# Patient Record
Sex: Female | Born: 1990 | Race: White | Hispanic: No | Marital: Single | Smoking: Never smoker
Health system: Southern US, Community
[De-identification: ages and names within clinical notes are randomized; demographics above are authoritative.]

## PROBLEM LIST (undated history)

## (undated) DIAGNOSIS — Z973 Presence of spectacles and contact lenses: Secondary | ICD-10-CM

## (undated) DIAGNOSIS — F419 Anxiety disorder, unspecified: Secondary | ICD-10-CM

## (undated) DIAGNOSIS — F32A Depression, unspecified: Secondary | ICD-10-CM

## (undated) DIAGNOSIS — K219 Gastro-esophageal reflux disease without esophagitis: Secondary | ICD-10-CM

## (undated) DIAGNOSIS — G43909 Migraine, unspecified, not intractable, without status migrainosus: Secondary | ICD-10-CM

## (undated) HISTORY — PX: TONSILLECTOMY AND ADENOIDECTOMY: SUR1326

## (undated) HISTORY — PX: TONSILLECTOMY: SUR1361

---
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
## 2009-04-23 DIAGNOSIS — J309 Allergic rhinitis, unspecified: Secondary | ICD-10-CM | POA: Insufficient documentation

## 2012-07-29 ENCOUNTER — Emergency Department
Admission: EM | Admit: 2012-07-29 | Discharge: 2012-07-29 | Disposition: A | Discharge status: Home / Self Care | Payer: BC Managed Care – PPO | Source: Home / Self Care | Attending: Family Medicine | Admitting: Family Medicine

## 2012-07-29 ENCOUNTER — Encounter: Payer: Self-pay | Admitting: *Deleted

## 2012-07-29 DIAGNOSIS — M940 Chondrocostal junction syndrome [Tietze]: Secondary | ICD-10-CM

## 2012-07-29 DIAGNOSIS — J111 Influenza due to unidentified influenza virus with other respiratory manifestations: Secondary | ICD-10-CM

## 2012-07-29 LAB — POCT INFLUENZA A/B: Influenza B, POC: NEGATIVE

## 2012-07-29 MED ORDER — OSELTAMIVIR PHOSPHATE 75 MG PO CAPS: 75.0000 mg | ORAL_CAPSULE | Freq: Two times a day (BID) | ORAL | Status: DC

## 2012-07-29 MED ORDER — BENZONATATE 200 MG PO CAPS: 200.0000 mg | ORAL_CAPSULE | Freq: Every day | ORAL | Status: DC

## 2012-07-29 NOTE — ED Provider Notes (Addendum)
History     CSN: [id number]  Arrival date & time 07/29/12  1631   First MD Initiated Contact with Patient 07/29/12 1648      Chief Complaint  Patient presents with  . Influenza      HPI Comments: This morning patient developed sudden onset flu symptoms with cough, sore throat, fever, chest tightness, fatigue, myalgias, and headache.  The history is provided by the patient.    History reviewed. No pertinent past medical history.  Past Surgical History  Procedure Date  . Tonsillectomy     Family History  Problem Relation Age of Onset  . Cancer Maternal Aunt     History  Substance Use Topics  . Smoking status: Never Smoker   . Smokeless tobacco: Never Used  . Alcohol Use: No    OB History    Grav Para Term Preterm Abortions TAB SAB Ect Mult Living                  Review of Systems + sore throat + cough No pleuritic pain + wheezing + nasal congestion + post-nasal drainage No sinus pain/pressure No itchy/red eyes ? earache No hemoptysis + SOB + fever, + chills + nausea No vomiting No abdominal pain + diarrhea No urinary symptoms No skin rashes + fatigue + myalgias + headache   Allergies  Penicillins  Home Medications   Current Outpatient Rx  Name  Route  Sig  Dispense  Refill  . CETIRIZINE HCL 1 MG/ML PO SYRP   Oral   Take by mouth daily.         . ETONOGESTREL-ETHINYL ESTRADIOL 0.12-0.015 MG/24HR VA RING   Vaginal   Place 1 each vaginally every 28 (twenty-eight) days. Insert vaginally and leave in place for 3 consecutive weeks, then remove for 1 week.         Marland Kitchen MAGNESIUM 30 MG PO TABS   Oral   Take 30 mg by mouth 2 (two) times daily.         Marland Kitchen B-2 PO   Oral   Take by mouth.         . TOPIRAMATE ER 100 MG PO CP24   Oral   Take by mouth.         . BENZONATATE 200 MG PO CAPS   Oral   Take 1 capsule (200 mg total) by mouth at bedtime. Take as needed for cough   12 capsule   0   . OSELTAMIVIR PHOSPHATE 75 MG PO  CAPS   Oral   Take 1 capsule (75 mg total) by mouth every 12 (twelve) hours.   10 capsule   0     BP 112/72  Pulse 113  Temp 100.6 F (38.1 C) (Oral)  Resp 22  Ht 5\' 1"  (1.549 m)  Wt 233 lb 4 oz (105.802 kg)  BMI 44.07 kg/m2  SpO2 97%  Physical Exam Nursing notes and Vital Signs reviewed. Appearance:  Patient appears stated age, and in no acute distress.  Patient is obese (BMI 44.1) Eyes:  Pupils are equal, round, and reactive to light and accomodation.  Extraocular movement is intact.  Conjunctivae are not inflamed  Ears:  Canals normal.  Tympanic membranes normal.  Nose:  Mildly congested turbinates.  No sinus tenderness.    Pharynx:  Normal Neck:  Supple.  Slightly tender shotty posterior nodes are palpated bilaterally  Lungs:  Clear to auscultation.  Breath sounds are equal.  Chest:  Distinct tenderness to palpation over  the mid-sternum.  Heart:  Regular rate and rhythm without murmurs, rubs, or gallops.  Abdomen:  Nontender without masses or hepatosplenomegaly.  Bowel sounds are present.  No CVA or flank tenderness.  Extremities:  No edema.  No calf tenderness Skin:  No rash present.   ED Course  Procedures none   Labs Reviewed  POCT INFLUENZA A/B      1. Influenza-like illness   2. Costochondritis, acute       MDM  Begin Tamiflu.  Prescription written for Benzonatate [facility]) to take at bedtime for night-time cough.  Take plain Mucinex (guaifenesin) twice daily for cough and congestion.  Increase fluid intake, rest.  Also recommend using saline nasal spray several times daily and saline nasal irrigation (AYR is a common brand) Recommend flu shot when well. Stop all antihistamines for now, and other non-prescription cough/cold preparations. Follow-up with family doctor if not improving 7 to 10 days.         Lattie Haw, MD 07/29/12 2008  Lattie Haw, MD 07/29/12 2008

## 2012-07-29 NOTE — ED Notes (Signed)
Patient c/o flu symptoms since this AM.

## 2012-11-16 ENCOUNTER — Encounter: Payer: Self-pay | Admitting: Sports Medicine

## 2012-11-16 ENCOUNTER — Ambulatory Visit (INDEPENDENT_AMBULATORY_CARE_PROVIDER_SITE_OTHER): Payer: BC Managed Care – PPO | Admitting: Sports Medicine

## 2012-11-16 VITALS — BP 133/84 | HR 108 | Wt 197.0 lb

## 2012-11-16 DIAGNOSIS — J309 Allergic rhinitis, unspecified: Secondary | ICD-10-CM

## 2012-11-16 DIAGNOSIS — E669 Obesity, unspecified: Secondary | ICD-10-CM

## 2012-11-16 DIAGNOSIS — Z299 Encounter for prophylactic measures, unspecified: Secondary | ICD-10-CM | POA: Insufficient documentation

## 2012-11-16 DIAGNOSIS — G43909 Migraine, unspecified, not intractable, without status migrainosus: Secondary | ICD-10-CM

## 2012-11-16 MED ORDER — FLUTICASONE PROPIONATE 50 MCG/ACT NA SUSP: NASAL | Status: DC

## 2012-11-16 MED ORDER — ATENOLOL 25 MG PO TABS: 25.0000 mg | ORAL_TABLET | Freq: Every day | ORAL | Status: DC

## 2012-11-16 NOTE — Assessment & Plan Note (Signed)
Continue phentermine and lipophilic shots. This is managed by an outside physician.

## 2012-11-16 NOTE — Assessment & Plan Note (Signed)
Last Pap smear was March of 2013. She follows up with [facility].

## 2012-11-16 NOTE — Progress Notes (Signed)
  Subjective:    CC: Establish care.   HPI:  Migraines: Usually occur 1-2 times per month but occasionally more frequently. Using topiramate.  Obesity: Is getting lipotrophic injections by an outside provider, also taking phentermine all of which is filled by the weight loss provider.  Nasal drainage: Present annually in the spring. Rhinorrhea, postnasal drip without sore throat. Already taking Zyrtec without much effect.  Preventative measures: Goes to [facility], last Pap smear was March of 2013 and was normal.  Past medical history, Surgical history, Family history not pertinant except as noted below, Social history, Allergies, and medications have been entered into the medical record, reviewed, and no changes needed.   Review of Systems: No headache, visual changes, nausea, vomiting, diarrhea, constipation, dizziness, abdominal pain, skin rash, fevers, chills, night sweats, swollen lymph nodes, weight loss, chest pain, body aches, joint swelling, muscle aches, shortness of breath, mood changes, visual or auditory hallucinations.  Objective:    General: Well Developed, well nourished, and in no acute distress.  Neuro: Alert and oriented x3, extra-ocular muscles intact, sensation grossly intact.  HEENT: Normocephalic, atraumatic, pupils equal round reactive to light, neck supple, no masses, no lymphadenopathy, thyroid nonpalpable. Nasal turbinates are boggy, oropharynx is unremarkable. Skin: Warm and dry, no rashes noted.  Cardiac: Regular rate and rhythm, no murmurs rubs or gallops.  Respiratory: Clear to auscultation bilaterally. Not using accessory muscles, speaking in full sentences.  Abdominal: Soft, nontender, nondistended, positive bowel sounds, no masses, no organomegaly.  Musculoskeletal: Shoulder, elbow, wrist, hip, knee, ankle stable, and with full range of motion. Impression and Recommendations:    The patient was counselled, risk factors were discussed, anticipatory  guidance given.

## 2012-11-16 NOTE — Assessment & Plan Note (Signed)
Continue topiramate. Adding atenolol, this will help prevent migraines as well as bring down her heart rate.

## 2012-11-16 NOTE — Assessment & Plan Note (Signed)
Continue Zyrtec, adding Flonase.

## 2012-11-24 ENCOUNTER — Encounter: Payer: Self-pay | Admitting: Sports Medicine

## 2012-11-24 NOTE — Progress Notes (Signed)
Records received and reviewed.

## 2012-12-14 ENCOUNTER — Encounter: Payer: BC Managed Care – PPO | Admitting: Sports Medicine

## 2012-12-27 ENCOUNTER — Encounter: Payer: Self-pay | Admitting: Sports Medicine

## 2012-12-27 ENCOUNTER — Ambulatory Visit (INDEPENDENT_AMBULATORY_CARE_PROVIDER_SITE_OTHER): Payer: BC Managed Care – PPO | Admitting: Sports Medicine

## 2012-12-27 VITALS — BP 104/64 | HR 74 | Wt 192.0 lb

## 2012-12-27 DIAGNOSIS — Z23 Encounter for immunization: Secondary | ICD-10-CM

## 2012-12-27 DIAGNOSIS — G43909 Migraine, unspecified, not intractable, without status migrainosus: Secondary | ICD-10-CM

## 2012-12-27 DIAGNOSIS — Z0000 Encounter for general adult medical examination without abnormal findings: Secondary | ICD-10-CM

## 2012-12-27 DIAGNOSIS — Z299 Encounter for prophylactic measures, unspecified: Secondary | ICD-10-CM

## 2012-12-27 NOTE — Assessment & Plan Note (Signed)
She went from 8 migraine headaches per month to 2 migraine headaches per month with the addition of atenolol.

## 2012-12-27 NOTE — Progress Notes (Signed)
  Subjective:    CC: Complete physical  HPI:  Preventive measure: Kendra Nolan is here for complete physical, she gets her Pap smears by her OB/GYN, and is otherwise healthy. She has not had a Tdap in over 10 years.  Migraines: Went down from 8 per month to 2 per month after starting atenolol. She is very happy with the results so far, and does not want to change the dose.  Past medical history, Surgical history, Family history not pertinant except as noted below, Social history, Allergies, and medications have been entered into the medical record, reviewed, and no changes needed.   Review of Systems: No headache, visual changes, nausea, vomiting, diarrhea, constipation, dizziness, abdominal pain, skin rash, fevers, chills, night sweats, swollen lymph nodes, weight loss, chest pain, body aches, joint swelling, muscle aches, shortness of breath, mood changes, visual or auditory hallucinations.  Objective:    General: Well Developed, well nourished, and in no acute distress.  Neuro: Alert and oriented x3, extra-ocular muscles intact, sensation grossly intact.  HEENT: Normocephalic, atraumatic, pupils equal round reactive to light, neck supple, no masses, no lymphadenopathy, thyroid nonpalpable.  Skin: Warm and dry, no rashes noted.  Cardiac: Regular rate and rhythm, no murmurs rubs or gallops.  Respiratory: Clear to auscultation bilaterally. Not using accessory muscles, speaking in full sentences.  Abdominal: Soft, nontender, nondistended, positive bowel sounds, no masses, no organomegaly.  Musculoskeletal: Shoulder, elbow, wrist, hip, knee, ankle stable, and with full range of motion. Impression and Recommendations:    The patient was counselled, risk factors were discussed, anticipatory guidance given.

## 2012-12-27 NOTE — Assessment & Plan Note (Addendum)
Physical performed today. PAP with her OBGYN. Tdap today. Return in one year.

## 2013-01-02 ENCOUNTER — Encounter: Payer: Self-pay | Admitting: Emergency Medicine

## 2013-01-02 ENCOUNTER — Emergency Department
Admission: EM | Admit: 2013-01-02 | Discharge: 2013-01-02 | Disposition: A | Discharge status: Home / Self Care | Payer: BC Managed Care – PPO | Source: Home / Self Care | Attending: Emergency Medicine | Admitting: Emergency Medicine

## 2013-01-02 DIAGNOSIS — B373 Candidiasis of vulva and vagina: Secondary | ICD-10-CM

## 2013-01-02 DIAGNOSIS — J069 Acute upper respiratory infection, unspecified: Secondary | ICD-10-CM

## 2013-01-02 MED ORDER — FLUCONAZOLE 150 MG PO TABS: 150.0000 mg | ORAL_TABLET | Freq: Once | ORAL | Status: DC

## 2013-01-02 MED ORDER — AZITHROMYCIN 250 MG PO TABS: ORAL_TABLET | ORAL | Status: DC

## 2013-01-02 NOTE — ED Provider Notes (Signed)
History     CSN: [id number]  Arrival date & time 01/02/13  1723   First MD Initiated Contact with Patient 01/02/13 1731      No chief complaint on file.   (Consider location/radiation/quality/duration/timing/severity/associated sxs/prior treatment) HPI Kendra Nolan is a 22 y.o. female who complains of onset of cold symptoms for 4-5 days.  The symptoms are constant and mild-moderate in severity.  Taking Flonase and Zyrtec which help a little bit.  Also complains of yeast infection.  Started mid-period a few days ago.  Has a history of similar.  White thick discharge, very itchy.  No dysuria, hematuria. No sore throat No cough No pleuritic pain No wheezing + nasal congestion + post-nasal drainage ++ sinus pain/pressure No chest congestion No itchy/red eyes No earache No hemoptysis No SOB No chills/sweats No fever No nausea No vomiting No abdominal pain No diarrhea No skin rashes No fatigue No myalgias + headache    No past medical history on file.  Past Surgical History  Procedure Laterality Date  . Tonsillectomy    . Tonsillectomy and adenoidectomy      Family History  Problem Relation Age of Onset  . Cancer Maternal Aunt   . Hypertension Mother   . Hyperlipidemia Father   . Hyperlipidemia Maternal Grandmother   . Hyperlipidemia Maternal Grandfather   . Hypertension Maternal Grandfather   . Hyperlipidemia Paternal Grandmother   . Hyperlipidemia Paternal Grandfather     History  Substance Use Topics  . Smoking status: Never Smoker   . Smokeless tobacco: Never Used  . Alcohol Use: No    OB History   Grav Para Term Preterm Abortions TAB SAB Ect Mult Living                  Review of Systems  All other systems reviewed and are negative.    Allergies  Aspartame and phenylalanine; Benzodiazepines; Clarithromycin; and Penicillins  Home Medications   Current Outpatient Rx  Name  Route  Sig  Dispense  Refill  . atenolol (TENORMIN) 25 MG tablet  Oral   Take 1 tablet (25 mg total) by mouth daily.   90 tablet   3   . azithromycin (ZITHROMAX Z-PAK) 250 MG tablet      Use as directed   1 each   0   . azithromycin (ZITHROMAX Z-PAK) 250 MG tablet      Use as directed   1 each   0   . cetirizine (ZYRTEC) 1 MG/ML syrup   Oral   Take by mouth daily.         Marland Kitchen etonogestrel-ethinyl estradiol (NUVARING) 0.12-0.015 MG/24HR vaginal ring   Vaginal   Place 1 each vaginally every 28 (twenty-eight) days. Insert vaginally and leave in place for 3 consecutive weeks, then remove for 1 week.         . fluconazole (DIFLUCAN) 150 MG tablet   Oral   Take 1 tablet (150 mg total) by mouth once. May repeat in 3 days   2 tablet   0   . fluticasone (FLONASE) 50 MCG/ACT nasal spray      One spray in each nostril twice a day, use left hand for right nostril, and right hand for left nostril.   48 g   3   . phentermine 37.5 MG capsule   Oral   Take 37.5 mg by mouth every morning.         . Topiramate ER (TROKENDI XR) 100 MG CP24  Oral   Take by mouth.           There were no vitals taken for this visit.  Physical Exam  Nursing note and vitals reviewed. Constitutional: She is oriented to person, place, and time. She appears well-developed and well-nourished.  HENT:  Head: Normocephalic and atraumatic.  Right Ear: Tympanic membrane, external ear and ear canal normal.  Left Ear: Tympanic membrane, external ear and ear canal normal.  Nose: Mucosal edema and rhinorrhea present.  Mouth/Throat: Posterior oropharyngeal erythema (mild with clear post nasal drip) present. No oropharyngeal exudate or posterior oropharyngeal edema.  Eyes: No scleral icterus.  Neck: Neck supple.  Cardiovascular: Regular rhythm and normal heart sounds.   Pulmonary/Chest: Effort normal and breath sounds normal. No respiratory distress.  Genitourinary:  deferred  Neurological: She is alert and oriented to person, place, and time.  Skin: Skin is warm  and dry.  Psychiatric: She has a normal mood and affect. Her speech is normal.    ED Course  Procedures (including critical care time)  Labs Reviewed - No data to display No results found.   1. Acute upper respiratory infections of unspecified site   2. Yeast vaginitis       MDM  1)  Take the prescribed antibiotic as instructed.  Viral vs allergic likely.  She is allergic to clarithromycin but can take Zpak no problem.  Will also give DIflucan.  If continued problems, needs to follow up with PCP for pelvic exam. 2)  Use nasal saline solution (over the counter) at least 3 times a day. 3)  Use over the counter decongestants like Zyrtec-D every 12 hours as needed to help with congestion.  If you have hypertension, do not take medicines with sudafed.  4)  Can take tylenol every 6 hours or motrin every 8 hours for pain or fever. 5)  Follow up with your primary doctor if no improvement in 5-7 days, sooner if increasing pain, fever, or new symptoms.     Marlaine Hind, MD 01/02/13 [phone]

## 2013-01-02 NOTE — ED Notes (Signed)
Runny nose for 4 days, congestion Itching and redness in vaginal area x 6 days

## 2013-01-08 ENCOUNTER — Emergency Department
Admission: EM | Admit: 2013-01-08 | Discharge: 2013-01-08 | Disposition: A | Discharge status: Home / Self Care | Payer: BC Managed Care – PPO | Source: Home / Self Care | Attending: Family Medicine | Admitting: Family Medicine

## 2013-01-08 ENCOUNTER — Emergency Department (INDEPENDENT_AMBULATORY_CARE_PROVIDER_SITE_OTHER): Payer: BC Managed Care – PPO

## 2013-01-08 DIAGNOSIS — J069 Acute upper respiratory infection, unspecified: Secondary | ICD-10-CM

## 2013-01-08 DIAGNOSIS — R05 Cough: Secondary | ICD-10-CM

## 2013-01-08 DIAGNOSIS — R053 Chronic cough: Secondary | ICD-10-CM

## 2013-01-08 MED ORDER — BENZONATATE 200 MG PO CAPS: 200.0000 mg | ORAL_CAPSULE | Freq: Every day | ORAL | Status: DC

## 2013-01-08 MED ORDER — DOXYCYCLINE HYCLATE 100 MG PO CAPS: 100.0000 mg | ORAL_CAPSULE | Freq: Two times a day (BID) | ORAL | Status: DC

## 2013-01-08 NOTE — ED Provider Notes (Signed)
History     CSN: [id number]  Arrival date & time 01/08/13  1526   First MD Initiated Contact with Patient 01/08/13 1636      Chief Complaint  Patient presents with  . Cough  . Nasal Congestion       HPI Comments: Patient developed URI symptoms about 1.5 weeks ago, and was treated here 6 days ago with azithromycin.  Over the past two days she has developed increased chest and head congestion.  Her cough is worse at night.  She had night sweats two days ago.  The history is provided by the patient.    History reviewed. No pertinent past medical history.  Past Surgical History  Procedure Laterality Date  . Tonsillectomy    . Tonsillectomy and adenoidectomy      Family History  Problem Relation Age of Onset  . Cancer Maternal Aunt   . Hypertension Mother   . Hyperlipidemia Father   . Hyperlipidemia Maternal Grandmother   . Hyperlipidemia Maternal Grandfather   . Hypertension Maternal Grandfather   . Hyperlipidemia Paternal Grandmother   . Hyperlipidemia Paternal Grandfather     History  Substance Use Topics  . Smoking status: Never Smoker   . Smokeless tobacco: Never Used  . Alcohol Use: No    OB History   Grav Para Term Preterm Abortions TAB SAB Ect Mult Living                  Review of Systems + sore throat + cough No pleuritic pain No wheezing + nasal congestion + post-nasal drainage No sinus pain/pressure + itchy/red eyes No earache No hemoptysis No SOB No fever, + chills No nausea No vomiting No abdominal pain No diarrhea No urinary symptoms No skin rashes + fatigue No myalgias No headache Used OTC meds without relief  Allergies  Aspartame and phenylalanine; Benzodiazepines; Clarithromycin; and Penicillins  Home Medications   Current Outpatient Rx  Name  Route  Sig  Dispense  Refill  . atenolol (TENORMIN) 25 MG tablet   Oral   Take 1 tablet (25 mg total) by mouth daily.   90 tablet   3   . cetirizine (ZYRTEC) 1 MG/ML syrup  Oral   Take by mouth daily.         Marland Kitchen etonogestrel-ethinyl estradiol (NUVARING) 0.12-0.015 MG/24HR vaginal ring   Vaginal   Place 1 each vaginally every 28 (twenty-eight) days. Insert vaginally and leave in place for 3 consecutive weeks, then remove for 1 week.         . fluticasone (FLONASE) 50 MCG/ACT nasal spray      One spray in each nostril twice a day, use left hand for right nostril, and right hand for left nostril.   48 g   3   . phentermine 37.5 MG capsule   Oral   Take 37.5 mg by mouth every morning.         . Topiramate ER (TROKENDI XR) 100 MG CP24   Oral   Take by mouth.         Marland Kitchen azithromycin (ZITHROMAX Z-PAK) 250 MG tablet      Use as directed   1 each   0   . azithromycin (ZITHROMAX Z-PAK) 250 MG tablet      Use as directed   1 each   0   . benzonatate (TESSALON) 200 MG capsule   Oral   Take 1 capsule (200 mg total) by mouth at bedtime.   12 capsule  0   . doxycycline (VIBRAMYCIN) 100 MG capsule   Oral   Take 1 capsule (100 mg total) by mouth 2 (two) times daily. (Rx void after 01/16/13)   20 capsule   0   . fluconazole (DIFLUCAN) 150 MG tablet   Oral   Take 1 tablet (150 mg total) by mouth once. May repeat in 3 days   2 tablet   0     BP 91/64  Pulse 87  Temp(Src) 98.4 F (36.9 C) (Oral)  Ht 5\' 1"  (1.549 m)  Wt 186 lb (84.369 kg)  BMI 35.16 kg/m2  SpO2 99%  LMP 12/26/2012  Physical Exam Nursing notes and Vital Signs reviewed. Appearance:  Patient appears stated age, and in no acute distress.  Patient is obese (BMI 35.2) Eyes:  Pupils are equal, round, and reactive to light and accomodation.  Extraocular movement is intact.  Conjunctivae are not inflamed  Ears:  Canals normal.  Tympanic membranes normal.  Nose:  Mildly congested turbinates.  No sinus tenderness.   Pharynx:  Normal Neck:  Supple.  Tender shotty posterior nodes are palpated bilaterally  Lungs:  Few anterior rhonchi heard.  Breath sounds are equal.  Chest:   Distinct tenderness to palpation over the mid-sternum.  Heart:  Regular rate and rhythm without murmurs, rubs, or gallops.  Abdomen:  Nontender without masses or hepatosplenomegaly.  Bowel sounds are present.  No CVA or flank tenderness.  Extremities:  No edema.  No calf tenderness Skin:  No rash present.   ED Course  Procedures  none   Dg Chest 2 View  01/08/2013   *RADIOLOGY REPORT*  Clinical Data: Cough and nasal congestion.  CHEST - 2 VIEW  Comparison: None.  Findings: Cardiomediastinal silhouette is within normal limits. The lungs are free of focal consolidations and pleural effusions. Bony structures have a normal appearance.  IMPRESSION: Negative exam.   Original Report Authenticated By: Norva Pavlov, M.D.     1. Cough, persistent   2. Acute upper respiratory infections of unspecified site       MDM  Prescription written for Benzonatate (Tessalon) to take at bedtime for night-time cough.  Take plain Mucinex (guaifenesin) twice daily for cough and congestion.  Increase fluid intake, rest. May use Afrin nasal spray (or generic oxymetazoline) twice daily for about 5 days.  Also recommend using saline nasal spray several times daily and saline nasal irrigation (AYR is a common brand) Stop all antihistamines for now, and other non-prescription cough/cold preparations. May take Ibuprofen 200mg , 4 tabs every 8 hours with food for chest/sternum discomfort. Begin doxycycline if not improving about 5 days or if persistent fever develops (Given a prescription to hold, with an expiration date)  Follow-up with family doctor if not improving 7 to 10 days.        Lattie Haw, MD 01/10/13 [phone]

## 2013-01-08 NOTE — ED Notes (Signed)
Steve complains of her cough and congestion is getting worse. She was seen here on Monday and treated with a Z pack. The cough is productive with green sputum and her nasal drainage is green. She has some body aches, sneezing, chest tightness and sore throat.

## 2013-01-09 ENCOUNTER — Telehealth: Payer: Self-pay | Admitting: *Deleted

## 2013-01-20 LAB — LIPID PANEL
Cholesterol: 148 mg/dL (ref 0–200)
HDL: 57 mg/dL (ref 35–70)
Triglycerides: 109 mg/dL (ref 40–160)

## 2013-01-20 LAB — HEPATIC FUNCTION PANEL
AST: 16 U/L (ref 13–35)
Bilirubin, Total: 0.2 mg/dL

## 2013-01-20 LAB — CBC AND DIFFERENTIAL
Hemoglobin: 13.2 g/dL (ref 12.0–16.0)
Neutrophils Absolute: 6504 /uL
Platelets: 329 10*3/uL (ref 150–399)
WBC: 9.9 10^3/mL

## 2013-01-20 LAB — HEMOGLOBIN A1C: Hgb A1c MFr Bld: 5.2 % (ref 4.0–6.0)

## 2013-01-20 LAB — TSH: TSH: 0.81 u[IU]/mL (ref 0.41–5.90)

## 2013-02-07 ENCOUNTER — Encounter: Payer: Self-pay | Admitting: *Deleted

## 2013-02-23 ENCOUNTER — Ambulatory Visit (INDEPENDENT_AMBULATORY_CARE_PROVIDER_SITE_OTHER): Payer: BC Managed Care – PPO | Admitting: Sports Medicine

## 2013-02-23 ENCOUNTER — Encounter: Payer: Self-pay | Admitting: Sports Medicine

## 2013-02-23 VITALS — BP 114/80 | HR 82 | Wt 186.0 lb

## 2013-02-23 DIAGNOSIS — F329 Major depressive disorder, single episode, unspecified: Secondary | ICD-10-CM | POA: Insufficient documentation

## 2013-02-23 MED ORDER — VENLAFAXINE HCL ER 75 MG PO CP24: 75.0000 mg | ORAL_CAPSULE | Freq: Every day | ORAL | Status: DC

## 2013-02-23 NOTE — Assessment & Plan Note (Signed)
Inadequate response to Lexapro and Wellbutrin. We are going to add norepinephrine and serotonin reuptake inhibition, starting Effexor. Return in 2 weeks.

## 2013-02-23 NOTE — Progress Notes (Signed)
  Subjective:    CC: Depressed mood  HPI: Kendra Nolan is a very pleasant 22 year old female who unfortunately has been feeling down lately. She has lots of stressors including working her way to school, bills, car payments, and disagreements with her parents. She does endorse moderate lack of interest, depressed mood, severe sleep difficulties, poor energy, poor appetite, feelings of guilt, poor concentration, psychomotor retardation, but no suicidal or homicidal ideation. She does desire pharmacologic intervention. She's already been on Lexapro and bupropion with inadequate effect.  Past medical history, Surgical history, Family history not pertinant except as noted below, Social history, Allergies, and medications have been entered into the medical record, reviewed, and no changes needed.   Review of Systems: No fevers, chills, night sweats, weight loss, chest pain, or shortness of breath.   Objective:    General: Well Developed, well nourished, and in no acute distress. Tearful. Neuro: Alert and oriented x3, extra-ocular muscles intact, sensation grossly intact.  HEENT: Normocephalic, atraumatic, pupils equal round reactive to light, neck supple, no masses, no lymphadenopathy, thyroid nonpalpable.  Skin: Warm and dry, no rashes. Cardiac: Regular rate and rhythm, no murmurs rubs or gallops, no lower extremity edema.  Respiratory: Clear to auscultation bilaterally. Not using accessory muscles, speaking in full sentences.  Impression and Recommendations:

## 2013-03-10 ENCOUNTER — Encounter: Payer: Self-pay | Admitting: Sports Medicine

## 2013-03-10 ENCOUNTER — Ambulatory Visit (INDEPENDENT_AMBULATORY_CARE_PROVIDER_SITE_OTHER): Payer: BC Managed Care – PPO | Admitting: Sports Medicine

## 2013-03-10 VITALS — BP 99/61 | HR 70 | Wt 186.0 lb

## 2013-03-10 DIAGNOSIS — F329 Major depressive disorder, single episode, unspecified: Secondary | ICD-10-CM

## 2013-03-10 MED ORDER — VENLAFAXINE HCL ER 150 MG PO CP24: 150.0000 mg | ORAL_CAPSULE | Freq: Every day | ORAL | Status: DC

## 2013-03-10 NOTE — Assessment & Plan Note (Signed)
Slight improvement with starting venlafaxine. Increasing to 150 mg. Return in 4 weeks.

## 2013-03-10 NOTE — Progress Notes (Signed)
  Subjective:    CC: Followup  HPI: Maj. depressive disorder: Kendra Nolan had an inadequate response to Lexapro, as well as augmentation with Wellbutrin. At the last visit we switched her to venlafaxine, 2 weeks later she is noting a very small improvement in her symptoms.  Past medical history, Surgical history, Family history not pertinant except as noted below, Social history, Allergies, and medications have been entered into the medical record, reviewed, and no changes needed.   Review of Systems: No fevers, chills, night sweats, weight loss, chest pain, or shortness of breath.   Objective:    General: Well Developed, well nourished, and in no acute distress.  Neuro: Alert and oriented x3, extra-ocular muscles intact, sensation grossly intact.  HEENT: Normocephalic, atraumatic, pupils equal round reactive to light, neck supple, no masses, no lymphadenopathy, thyroid nonpalpable.  Skin: Warm and dry, no rashes. Cardiac: Regular rate and rhythm, no murmurs rubs or gallops, no lower extremity edema.  Respiratory: Clear to auscultation bilaterally. Not using accessory muscles, speaking in full sentences.  Impression and Recommendations:

## 2013-04-07 ENCOUNTER — Ambulatory Visit (INDEPENDENT_AMBULATORY_CARE_PROVIDER_SITE_OTHER): Payer: BC Managed Care – PPO | Admitting: Sports Medicine

## 2013-04-07 ENCOUNTER — Encounter: Payer: Self-pay | Admitting: Sports Medicine

## 2013-04-07 VITALS — BP 102/69 | HR 78 | Wt 185.0 lb

## 2013-04-07 DIAGNOSIS — F329 Major depressive disorder, single episode, unspecified: Secondary | ICD-10-CM

## 2013-04-07 NOTE — Assessment & Plan Note (Signed)
Greatly improved with venlafaxine 150 mg. Continue this dose, refills as needed.

## 2013-04-07 NOTE — Progress Notes (Signed)
  Subjective:    CC: Followup  HPI: Maj. depression: Failed citalopram with bupropion augmentation, switched to venlafaxine, it increased at the last visit, notes improvement in mood, energy level, interest, improved sleep. Happy with results of 4.  Past medical history, Surgical history, Family history not pertinant except as noted below, Social history, Allergies, and medications have been entered into the medical record, reviewed, and no changes needed.   Review of Systems: No fevers, chills, night sweats, weight loss, chest pain, or shortness of breath.   Objective:    General: Well Developed, well nourished, and in no acute distress.  Neuro: Alert and oriented x3, extra-ocular muscles intact, sensation grossly intact.  HEENT: Normocephalic, atraumatic, pupils equal round reactive to light, neck supple, no masses, no lymphadenopathy, thyroid nonpalpable.  Skin: Warm and dry, no rashes. Cardiac: Regular rate and rhythm, no murmurs rubs or gallops, no lower extremity edema.  Respiratory: Clear to auscultation bilaterally. Not using accessory muscles, speaking in full sentences. Impression and Recommendations:    I spent 25 minutes with this patient, 50% was face-to-face time counselling regarding depression.

## 2013-05-04 ENCOUNTER — Encounter: Payer: Self-pay | Admitting: Sports Medicine

## 2013-05-04 ENCOUNTER — Ambulatory Visit (INDEPENDENT_AMBULATORY_CARE_PROVIDER_SITE_OTHER): Payer: BC Managed Care – PPO | Admitting: Sports Medicine

## 2013-05-04 VITALS — BP 106/66 | HR 78 | Wt 191.0 lb

## 2013-05-04 DIAGNOSIS — F329 Major depressive disorder, single episode, unspecified: Secondary | ICD-10-CM

## 2013-05-04 DIAGNOSIS — G43909 Migraine, unspecified, not intractable, without status migrainosus: Secondary | ICD-10-CM

## 2013-05-04 DIAGNOSIS — R079 Chest pain, unspecified: Secondary | ICD-10-CM

## 2013-05-04 MED ORDER — SUMATRIPTAN SUCCINATE 50 MG PO TABS: 50.0000 mg | ORAL_TABLET | ORAL | Status: DC | PRN

## 2013-05-04 NOTE — Patient Instructions (Addendum)
Migraine Headache A migraine headache is an intense, throbbing pain on one or both sides of your head. A migraine can last for 30 minutes to several hours. CAUSES  The exact cause of a migraine headache is not always known. However, a migraine may be caused when nerves in the brain become irritated and release chemicals that cause inflammation. This causes pain. SYMPTOMS  Pain on one or both sides of your head.  Pulsating or throbbing pain.  Severe pain that prevents daily activities.  Pain that is aggravated by any physical activity.  Nausea, vomiting, or both.  Dizziness.  Pain with exposure to bright lights, loud noises, or activity.  General sensitivity to bright lights, loud noises, or smells. Before you get a migraine, you may get warning signs that a migraine is coming (aura). An aura may include:  Seeing flashing lights.  Seeing bright spots, halos, or zig-zag lines.  Having tunnel vision or blurred vision.  Having feelings of numbness or tingling.  Having trouble talking.  Having muscle weakness. MIGRAINE TRIGGERS  Alcohol.  Smoking.  Stress.  Menstruation.  Aged cheeses.  Foods or drinks that contain nitrates, glutamate, aspartame, or tyramine.  Lack of sleep.  Chocolate.  Caffeine.  Hunger.  Physical exertion.  Fatigue.  Medicines used to treat chest pain (nitroglycerine), birth control pills, estrogen, and some blood pressure medicines. DIAGNOSIS  A migraine headache is often diagnosed based on:  Symptoms.  Physical examination.  A CT scan or MRI of your head. TREATMENT Medicines may be given for pain and nausea. Medicines can also be given to help prevent recurrent migraines.  HOME CARE INSTRUCTIONS  Only take over-the-counter or prescription medicines for pain or discomfort as directed by your caregiver. The use of long-term narcotics is not recommended.  Lie down in a dark, quiet room when you have a migraine.  Keep a journal  to find out what may trigger your migraine headaches. For example, write down:  What you eat and drink.  How much sleep you get.  Any change to your diet or medicines.  Limit alcohol consumption.  Quit smoking if you smoke.  Get 7 to 9 hours of sleep, or as recommended by your caregiver.  Limit stress.  Keep lights dim if bright lights bother you and make your migraines worse. SEEK IMMEDIATE MEDICAL CARE IF:   Your migraine becomes severe.  You have a fever.  You have a stiff neck.  You have vision loss.  You have muscular weakness or loss of muscle control.  You start losing your balance or have trouble walking.  You feel faint or pass out.  You have severe symptoms that are different from your first symptoms. MAKE SURE YOU:   Understand these instructions.  Will watch your condition.  Will get help right away if you are not doing well or get worse. Document Released: 07/20/2005 Document Revised: 10/12/2011 Document Reviewed: 07/10/2011 [employer] Patient Information 2014 [employer].  

## 2013-05-04 NOTE — Progress Notes (Signed)
  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 She has no migraine abortive medications. Several days ago she was driving home, and felt numbness on her face, slurred speech, and twitching of her eyelid, this was followed by nausea, and then a subsequent migraine headache. It is just now starting to let off. She was seen in urgent care Center, where her neurologic exam was normal. She also developed some chest pain, had a CT angiogram of the chest that was negative. She is now also developing a sore throat and sniffles. Symptoms are moderate, improving.  Depression: Improving significantly on the venlafaxine. Desires to continue dose  Past medical history, Surgical history, Family history not pertinant except as noted below, Social history, Allergies, and medications have been entered into the medical record, reviewed, and no changes needed.   Review of Systems: No fevers, chills, night sweats, weight loss, chest pain, or shortness of breath.   Objective:    General: Well Developed, well nourished, and in no acute distress.  Neuro: Alert and oriented x3, extra-ocular muscles intact, sensation grossly intact. Cranial nerves II through XII are intact, motor, sensory, and according to functions are intact, no pronator drift, no finger dysmetria, no dysdiadochokinesis.  HEENT: Normocephalic, atraumatic, pupils equal round reactive to light, neck supple, no masses, no lymphadenopathy, thyroid nonpalpable.  Skin: Warm and dry, no rashes. Cardiac: Regular rate and rhythm, no murmurs rubs or gallops, no lower extremity edema.  Respiratory: Clear to auscultation bilaterally. Not using accessory muscles, speaking in full sentences. Impression and Recommendations:

## 2013-05-04 NOTE — Assessment & Plan Note (Signed)
Continue venlafaxine at 150 mg, mood is good. Return as needed for this.

## 2013-05-04 NOTE — Assessment & Plan Note (Signed)
She is developing upper respiratory symptoms, this likely represents pleurisy. Lungs are clear. He has already had a CT angiogram of the pulmonary arteries that was negative.

## 2013-05-04 NOTE — Assessment & Plan Note (Signed)
It sounds as though she had a complex migraine. Symptoms are resolving. She is on long acting Topamax as well as atenolol for preventive measures, in general she does not have any migraines in the preventive measures are working very well. She does not have a migraine abortive medication such as Imitrex.

## 2013-06-07 ENCOUNTER — Other Ambulatory Visit: Payer: Self-pay | Admitting: Sports Medicine

## 2013-06-08 ENCOUNTER — Other Ambulatory Visit: Payer: Self-pay

## 2013-06-19 ENCOUNTER — Encounter: Payer: Self-pay | Admitting: Emergency Medicine

## 2013-06-19 ENCOUNTER — Emergency Department
Admission: EM | Admit: 2013-06-19 | Discharge: 2013-06-19 | Disposition: A | Discharge status: Home / Self Care | Payer: BC Managed Care – PPO | Source: Home / Self Care | Attending: Family Medicine | Admitting: Family Medicine

## 2013-06-19 DIAGNOSIS — R112 Nausea with vomiting, unspecified: Secondary | ICD-10-CM

## 2013-06-19 DIAGNOSIS — R197 Diarrhea, unspecified: Secondary | ICD-10-CM

## 2013-06-19 LAB — POCT CBC W AUTO DIFF (K'VILLE URGENT CARE)

## 2013-06-19 MED ORDER — TRIMETHOBENZAMIDE HCL 300 MG PO CAPS: 300.0000 mg | ORAL_CAPSULE | Freq: Three times a day (TID) | ORAL | Status: DC

## 2013-06-19 NOTE — ED Provider Notes (Signed)
CSN: [id number]     Arrival date & time 06/19/13  8657 History   First MD Initiated Contact with Patient 06/19/13 1033     Chief Complaint  Patient presents with  . Emesis      HPI Comments: Two days ago patient developed migraine headache with chills and myalgias.  Yesterday she developed nausea/vomiting and watery diarrhea.  She has had nasal congestion but no sore throat or cough.  Denies recent foreign travel, or drinking untreated water in a wilderness environment.   Patient is a 22 y.o. female presenting with vomiting. The history is provided by the patient.  Emesis Severity:  Mild Duration:  1 day Timing:  Intermittent Able to tolerate:  Liquids Progression:  Unchanged Chronicity:  New Recent urination:  Normal Relieved by:  Nothing Worsened by:  Nothing tried Ineffective treatments:  None tried Associated symptoms: abdominal pain, chills, diarrhea, fever, headaches and myalgias   Associated symptoms: no arthralgias, no sore throat and no URI   Diarrhea:    Duration:  1 day   Timing:  Intermittent   Progression:  Improving Headaches:    Severity:  Mild Myalgias:    Location:  Generalized Risk factors: suspect food intake   Risk factors: no sick contacts and no travel to endemic areas     History reviewed. No pertinent past medical history. Past Surgical History  Procedure Laterality Date  . Tonsillectomy    . Tonsillectomy and adenoidectomy     Family History  Problem Relation Age of Onset  . Cancer Maternal Aunt   . Hypertension Mother   . Hyperlipidemia Father   . Hyperlipidemia Maternal Grandmother   . Hyperlipidemia Maternal Grandfather   . Hypertension Maternal Grandfather   . Hyperlipidemia Paternal Grandmother   . Hyperlipidemia Paternal Grandfather    History  Substance Use Topics  . Smoking status: Never Smoker   . Smokeless tobacco: Never Used  . Alcohol Use: No   OB History   Grav Para Term Preterm Abortions TAB SAB Ect Mult Living             Review of Systems  Constitutional: Positive for chills.  HENT: Negative for sore throat.   Gastrointestinal: Positive for vomiting, abdominal pain and diarrhea.  Musculoskeletal: Positive for myalgias. Negative for arthralgias.  Neurological: Positive for headaches.    Allergies  Aspartame and phenylalanine; Benzodiazepines; Clarithromycin; and Penicillins  Home Medications   Current Outpatient Rx  Name  Route  Sig  Dispense  Refill  . atenolol (TENORMIN) 25 MG tablet   Oral   Take 1 tablet (25 mg total) by mouth daily.   90 tablet   3   . cetirizine (ZYRTEC) 1 MG/ML syrup   Oral   Take by mouth daily.         Marland Kitchen etonogestrel-ethinyl estradiol (NUVARING) 0.12-0.015 MG/24HR vaginal ring   Vaginal   Place 1 each vaginally every 28 (twenty-eight) days. Insert vaginally and leave in place for 3 consecutive weeks, then remove for 1 week.         . fluticasone (FLONASE) 50 MCG/ACT nasal spray      One spray in each nostril twice a day, use left hand for right nostril, and right hand for left nostril.   48 g   3   . phentermine 37.5 MG capsule   Oral   Take 37.5 mg by mouth every morning.         . SUMAtriptan (IMITREX) 50 MG tablet   Oral  Take 1 tablet (50 mg total) by mouth every 2 (two) hours as needed for migraine (May repeat x1 in 2h if needed.). May repeat in 2 hours if headache persists or recurs.   30 tablet   0   . Topiramate ER (TROKENDI XR) 100 MG CP24   Oral   Take by mouth.         . trimethobenzamide (TIGAN) 300 MG capsule   Oral   Take 1 capsule (300 mg total) by mouth 3 (three) times daily.   15 capsule   0   . venlafaxine XR (EFFEXOR-XR) 150 MG 24 hr capsule      TAKE ONE CAPSULE EVERY DAY   30 capsule   2    BP 110/72  Pulse 73  Temp(Src) 97.9 F (36.6 C) (Oral)  Ht 5\' 1"  (1.549 m)  Wt 198 lb (89.812 kg)  BMI 37.43 kg/m2  SpO2 100% Physical Exam Nursing notes and Vital Signs reviewed. Appearance:  Patient appears  stated age, and in no acute distress.  Patient is obese (BMI 37.4) Eyes:  Pupils are equal, round, and reactive to light and accomodation.  Extraocular movement is intact.  Conjunctivae are not inflamed  Ears:  Canals normal.  Tympanic membranes normal.  Nose:   Normal turbinates.  No sinus tenderness.    Pharynx:  Normal;  moist mucous membranes  Neck:  Supple.   No adenopathy Lungs:  Clear to auscultation.  Breath sounds are equal.  Heart:  Regular rate and rhythm without murmurs, rubs, or gallops.  Abdomen:   Mild diffuse tenderness without masses or hepatosplenomegaly.  Bowel sounds are present.  No CVA or flank tenderness.  Extremities:  No edema.  No calf tenderness Skin:  No rash present.   ED Course  Procedures  none    Labs Reviewed  POCT CBC W AUTO DIFF ([facility]) - Abnormal; Notable for the following:  WBC 10.6; LY 20.7; MO 4.9; GR 74.4; Hgb 13.8; Platelets 231         MDM   1. Nausea with vomiting; suspect viral gastroenteritis   2. Diarrhea    Rx written for Tigan Begin clear liquids (adult oral rehydration solution while having diarrhea) until improved, then advance to a BRAT diet.  Then gradually resume a regular diet when tolerated.  Avoid milk products until well.  To decrease diarrhea, mix one heaping tablespoon Citrucel (methylcellulose) in 8 oz water and drink one to three times daily.  When stools become more formed, may take Imodium (loperamide) once or twice daily to decrease stool frequency.  Followup with Family Doctor if not improved in about 4 to 5 days. If symptoms become significantly worse during the night or over the weekend, proceed to the local emergency room.     Lattie Haw, MD 06/23/13 2136

## 2013-06-19 NOTE — ED Notes (Signed)
Nausea, vomiting this morning, diarrhea yesterday, chills, headache, runny nose x 2 days

## 2013-06-20 ENCOUNTER — Telehealth: Payer: Self-pay | Admitting: *Deleted

## 2013-09-15 ENCOUNTER — Encounter: Payer: Self-pay | Admitting: Physician Assistant

## 2013-09-15 ENCOUNTER — Ambulatory Visit (INDEPENDENT_AMBULATORY_CARE_PROVIDER_SITE_OTHER): Payer: BC Managed Care – PPO | Admitting: Physician Assistant

## 2013-09-15 VITALS — BP 116/60 | HR 87 | Temp 98.1°F | Wt 222.0 lb

## 2013-09-15 DIAGNOSIS — J069 Acute upper respiratory infection, unspecified: Secondary | ICD-10-CM

## 2013-09-15 DIAGNOSIS — J209 Acute bronchitis, unspecified: Secondary | ICD-10-CM

## 2013-09-15 MED ORDER — METHYLPREDNISOLONE SODIUM SUCC 125 MG IJ SOLR
125.0000 mg | Freq: Once | INTRAMUSCULAR | Status: AC
  Administered 2013-09-15: 125 mg via INTRAMUSCULAR

## 2013-09-15 MED ORDER — AZITHROMYCIN 250 MG PO TABS: ORAL_TABLET | ORAL | Status: DC

## 2013-09-15 NOTE — Progress Notes (Signed)
   Subjective:    Patient ID: Kendra Nolan, female    DOB: 07/05/1991, 23 y.o.   MRN: [id number]  HPI Pt is a 23 yo female who presents to the clinic with cough, ST, and ear pain for the last 7 days. [facility] but never tested. She has been on tamiflu. She continues to get worse. She feels very tight in her chest. She has some wheezing but mostly at night. Her cough is productive with green sputum. No fever, chills, nausea, vomiting. Some mild ear pain and ST. No sinus pressure. No other exposure to flu.    Review of Systems     Objective:   Physical Exam  Constitutional: She is oriented to person, place, and time. She appears well-developed.  HENT:  Head: Normocephalic and atraumatic.  Right Ear: External ear normal.  Left Ear: External ear normal.  Nose: Nose normal.  Mouth/Throat: Oropharynx is clear and moist.  Eyes: Conjunctivae are normal. Right eye exhibits no discharge. Left eye exhibits no discharge.  Neck: Normal range of motion. Neck supple.  Cardiovascular: Normal rate, regular rhythm and normal heart sounds.   Pulmonary/Chest: Effort normal and breath sounds normal. She has no wheezes.  Lymphadenopathy:    She has no cervical adenopathy.  Neurological: She is alert and oriented to person, place, and time.  Skin: Skin is dry.  Psychiatric: She has a normal mood and affect. Her behavior is normal.          Assessment & Plan:  Acute bronchitis- Gave 125mg  solu-medrol in office today. Treated with zpak. Continue to use flonase daily. Mucinex D twice a day drinking plenty of water. For cough delsym. If not improving or worsening call office.

## 2013-09-15 NOTE — Patient Instructions (Signed)
Acute Bronchitis Bronchitis is inflammation of the airways that extend from the windpipe into the lungs (bronchi). The inflammation often causes mucus to develop. This leads to a cough, which is the most common symptom of bronchitis.  In acute bronchitis, the condition usually develops suddenly and goes away over time, usually in a couple weeks. Smoking, allergies, and asthma can make bronchitis worse. Repeated episodes of bronchitis may cause further lung problems.  CAUSES Acute bronchitis is most often caused by the same virus that causes a cold. The virus can spread from person to person (contagious).  SIGNS AND SYMPTOMS   Cough.   Fever.   Coughing up mucus.   Body aches.   Chest congestion.   Chills.   Shortness of breath.   Sore throat.  DIAGNOSIS  Acute bronchitis is usually diagnosed through a physical exam. Tests, such as chest X-rays, are sometimes done to rule out other conditions.  TREATMENT  Acute bronchitis usually goes away in a couple weeks. Often times, no medical treatment is necessary. Medicines are sometimes given for relief of fever or cough. Antibiotics are usually not needed but may be prescribed in certain situations. In some cases, an inhaler may be recommended to help reduce shortness of breath and control the cough. A cool mist vaporizer may also be used to help thin bronchial secretions and make it easier to clear the chest.  HOME CARE INSTRUCTIONS  Get plenty of rest.   Drink enough fluids to keep your urine clear or pale yellow (unless you have a medical condition that requires fluid restriction). Increasing fluids may help thin your secretions and will prevent dehydration.   Only take over-the-counter or prescription medicines as directed by your health care provider.   Avoid smoking and secondhand smoke. Exposure to cigarette smoke or irritating chemicals will make bronchitis worse. If you are a smoker, consider using nicotine gum or skin  patches to help control withdrawal symptoms. Quitting smoking will help your lungs heal faster.   Reduce the chances of another bout of acute bronchitis by washing your hands frequently, avoiding people with cold symptoms, and trying not to touch your hands to your mouth, nose, or eyes.   Follow up with your health care provider as directed.  SEEK MEDICAL CARE IF: Your symptoms do not improve after 1 week of treatment.  SEEK IMMEDIATE MEDICAL CARE IF:  You develop an increased fever or chills.   You have chest pain.   You have severe shortness of breath.  You have bloody sputum.   You develop dehydration.  You develop fainting.  You develop repeated vomiting.  You develop a severe headache. MAKE SURE YOU:   Understand these instructions.  Will watch your condition.  Will get help right away if you are not doing well or get worse. Document Released: 08/27/2004 Document Revised: 03/22/2013 Document Reviewed: 01/10/2013 [employer] Patient Information 2014 [employer].  

## 2013-10-21 ENCOUNTER — Other Ambulatory Visit: Payer: Self-pay | Admitting: Sports Medicine

## 2013-10-25 ENCOUNTER — Encounter: Payer: Self-pay | Admitting: Sports Medicine

## 2013-10-25 ENCOUNTER — Ambulatory Visit (INDEPENDENT_AMBULATORY_CARE_PROVIDER_SITE_OTHER): Payer: BC Managed Care – PPO | Admitting: Sports Medicine

## 2013-10-25 VITALS — BP 102/64 | HR 80 | Ht 61.0 in | Wt 232.0 lb

## 2013-10-25 DIAGNOSIS — E669 Obesity, unspecified: Secondary | ICD-10-CM

## 2013-10-25 MED ORDER — PHENTERMINE HCL 37.5 MG PO CAPS: 37.5000 mg | ORAL_CAPSULE | ORAL | Status: DC

## 2013-10-25 NOTE — Progress Notes (Signed)
  Subjective:    CC: Weight loss  HPI: Obesity: Has been on phentermine in the past, desires to restart, she is tried exercising, diet, nothing has worked.  Past medical history, Surgical history, Family history not pertinant except as noted below, Social history, Allergies, and medications have been entered into the medical record, reviewed, and no changes needed.   Review of Systems: No fevers, chills, night sweats, weight loss, chest pain, or shortness of breath.   Objective:    General: Well Developed, well nourished, and in no acute distress.  Neuro: Alert and oriented x3, extra-ocular muscles intact, sensation grossly intact.  HEENT: Normocephalic, atraumatic, pupils equal round reactive to light, neck supple, no masses, no lymphadenopathy, thyroid nonpalpable.  Skin: Warm and dry, no rashes. Cardiac: Regular rate and rhythm, no murmurs rubs or gallops, no lower extremity edema.  Respiratory: Clear to auscultation bilaterally. Not using accessory muscles, speaking in full sentences.  Impression and Recommendations:   I spent 25 minutes with this patient, greater than 50% was face to face time counseling regarding obesity and the treatment.

## 2013-10-25 NOTE — Assessment & Plan Note (Signed)
Starting phentermine, exercise prescription and nutritionist referral.  Return monthly for weight checks and refills

## 2013-11-22 ENCOUNTER — Ambulatory Visit: Payer: BC Managed Care – PPO | Admitting: Sports Medicine

## 2013-11-25 ENCOUNTER — Other Ambulatory Visit: Payer: Self-pay | Admitting: Sports Medicine

## 2013-12-01 ENCOUNTER — Ambulatory Visit: Payer: Self-pay | Admitting: Sports Medicine

## 2013-12-06 ENCOUNTER — Other Ambulatory Visit: Payer: Self-pay | Admitting: *Deleted

## 2013-12-06 MED ORDER — VENLAFAXINE HCL ER 150 MG PO CP24: ORAL_CAPSULE | ORAL | Status: DC

## 2013-12-07 ENCOUNTER — Other Ambulatory Visit: Payer: Self-pay

## 2014-01-05 ENCOUNTER — Ambulatory Visit (INDEPENDENT_AMBULATORY_CARE_PROVIDER_SITE_OTHER): Payer: BC Managed Care – PPO | Admitting: Sports Medicine

## 2014-01-05 ENCOUNTER — Encounter: Payer: Self-pay | Admitting: Sports Medicine

## 2014-01-05 VITALS — BP 105/67 | HR 74 | Wt 244.0 lb

## 2014-01-05 DIAGNOSIS — F329 Major depressive disorder, single episode, unspecified: Secondary | ICD-10-CM

## 2014-01-05 DIAGNOSIS — E669 Obesity, unspecified: Secondary | ICD-10-CM

## 2014-01-05 DIAGNOSIS — Z299 Encounter for prophylactic measures, unspecified: Secondary | ICD-10-CM

## 2014-01-05 DIAGNOSIS — G43909 Migraine, unspecified, not intractable, without status migrainosus: Secondary | ICD-10-CM

## 2014-01-05 MED ORDER — VENLAFAXINE HCL ER 150 MG PO CP24: ORAL_CAPSULE | ORAL | Status: DC

## 2014-01-05 NOTE — Assessment & Plan Note (Signed)
She's going to set up her cervical cancer screening. She is due

## 2014-01-05 NOTE — Assessment & Plan Note (Addendum)
Extremely well controlled.

## 2014-01-05 NOTE — Assessment & Plan Note (Signed)
She has not yet started phentermine. She will start this and then return monthly for weight checks and refills.

## 2014-01-05 NOTE — Assessment & Plan Note (Signed)
Very well controlled, refilling Effexor

## 2014-01-05 NOTE — Progress Notes (Signed)
  Subjective:    CC: Followup  HPI: Depression: Well controlled on Effexor 150.  Migraines: Well controlled on Topamax and atenolol.  Obesity: Has not yet taken her weight loss medication. She did fill it.  Past medical history, Surgical history, Family history not pertinant except as noted below, Social history, Allergies, and medications have been entered into the medical record, reviewed, and no changes needed.   Review of Systems: No fevers, chills, night sweats, weight loss, chest pain, or shortness of breath.   Objective:    General: Well Developed, well nourished, and in no acute distress.  Neuro: Alert and oriented x3, extra-ocular muscles intact, sensation grossly intact.  HEENT: Normocephalic, atraumatic, pupils equal round reactive to light, neck supple, no masses, no lymphadenopathy, thyroid nonpalpable.  Skin: Warm and dry, no rashes. Cardiac: Regular rate and rhythm, no murmurs rubs or gallops, no lower extremity edema.  Respiratory: Clear to auscultation bilaterally. Not using accessory muscles, speaking in full sentences.  Impression and Recommendations:

## 2014-01-30 ENCOUNTER — Encounter: Payer: Self-pay | Admitting: Emergency Medicine

## 2014-01-30 ENCOUNTER — Emergency Department (INDEPENDENT_AMBULATORY_CARE_PROVIDER_SITE_OTHER)
Admission: EM | Admit: 2014-01-30 | Discharge: 2014-01-30 | Disposition: A | Discharge status: Home / Self Care | Payer: BC Managed Care – PPO | Source: Home / Self Care | Attending: Emergency Medicine | Admitting: Emergency Medicine

## 2014-01-30 DIAGNOSIS — N390 Urinary tract infection, site not specified: Secondary | ICD-10-CM

## 2014-01-30 LAB — POCT URINALYSIS DIP (MANUAL ENTRY)
Bilirubin, UA: NEGATIVE
Glucose, UA: NEGATIVE
Ketones, POC UA: NEGATIVE
Leukocytes, UA: NEGATIVE
Nitrite, UA: NEGATIVE
PH UA: 6 (ref 5–8)
PROTEIN UA: NEGATIVE
SPEC GRAV UA: 1.025 (ref 1.005–1.03)
Urobilinogen, UA: 0.2 (ref 0–1)

## 2014-01-30 MED ORDER — FLUCONAZOLE 150 MG PO TABS: 150.0000 mg | ORAL_TABLET | Freq: Once | ORAL | Status: DC

## 2014-01-30 NOTE — ED Provider Notes (Signed)
CSN: [id number]     Arrival date & time 01/30/14  1612 History   First MD Initiated Contact with Patient 01/30/14 1614     No chief complaint on file.  (Consider location/radiation/quality/duration/timing/severity/associated sxs/prior Treatment) HPI Kendra Nolan is a 23 y.o. female who presents today with UTI symptoms for 1 days.  No dysuria No frequency No urgency No hematuria + vaginal discharge (white, no odor, +h/o yeast infection).  No recent ABX. No fever/chills +/- lower abdominal pain +/- back pain No fatigue    No past medical history on file. Past Surgical History  Procedure Laterality Date  . Tonsillectomy    . Tonsillectomy and adenoidectomy     Family History  Problem Relation Age of Onset  . Cancer Maternal Aunt   . Hypertension Mother   . Hyperlipidemia Father   . Hyperlipidemia Maternal Grandmother   . Hyperlipidemia Maternal Grandfather   . Hypertension Maternal Grandfather   . Hyperlipidemia Paternal Grandmother   . Hyperlipidemia Paternal Grandfather    History  Substance Use Topics  . Smoking status: Never Smoker   . Smokeless tobacco: Never Used  . Alcohol Use: No   OB History   Grav Para Term Preterm Abortions TAB SAB Ect Mult Living                 Review of Systems  All other systems reviewed and are negative.   Allergies  Aspartame and phenylalanine; Benzodiazepines; Clarithromycin; and Penicillins  Home Medications   Prior to Admission medications   Medication Sig Start Date End Date Taking? Authorizing Provider  atenolol (TENORMIN) 25 MG tablet TAKE 1 TABLET (25 MG TOTAL) BY MOUTH DAILY.    Monica Bectonhomas J Thekkekandam, MD  cetirizine (ZYRTEC) 1 MG/ML syrup Take by mouth daily.    Historical Provider, MD  etonogestrel-ethinyl estradiol (NUVARING) 0.12-0.015 MG/24HR vaginal ring Place 1 each vaginally every 28 (twenty-eight) days. Insert vaginally and leave in place for 3 consecutive weeks, then remove for 1 week.    Historical Provider, MD   fluconazole (DIFLUCAN) 150 MG tablet Take 1 tablet (150 mg total) by mouth once. May repeat in 3 days 01/30/14   Marlaine HindJeffrey H Henderson, MD  fluticasone [facility](FLONASE) 50 MCG/ACT nasal spray One spray in each nostril twice a day, use left hand for right nostril, and right hand for left nostril. 11/16/12   Monica Bectonhomas J Thekkekandam, MD  phentermine 37.5 MG capsule Take 1 capsule (37.5 mg total) by mouth every morning. 10/25/13   Monica Bectonhomas J Thekkekandam, MD  Topiramate ER (TROKENDI XR) 100 MG CP24 Take by mouth.    Historical Provider, MD  venlafaxine XR (EFFEXOR-XR) 150 MG 24 hr capsule TAKE ONE CAPSULE EVERY DAY 01/05/14   Monica Bectonhomas J Thekkekandam, MD   There were no vitals taken for this visit. Physical Exam  Nursing note and vitals reviewed. Constitutional: She is oriented to person, place, and time. She appears well-developed and well-nourished.  HENT:  Head: Normocephalic and atraumatic.  Eyes: No scleral icterus.  Neck: Neck supple.  Cardiovascular: Regular rhythm and normal heart sounds.   Pulmonary/Chest: Effort normal and breath sounds normal. No respiratory distress.  Neurological: She is alert and oriented to person, place, and time.  Skin: Skin is warm and dry.  Psychiatric: She has a normal mood and affect. Her speech is normal.    ED Course  Procedures (including critical care time) Labs Review Labs Reviewed - No data to display  Imaging Review No results found.   MDM   1. Urinary  tract infection, site not specified    1) Symptoms most consistent with yeast vaginitis.  If not improving, would add Flagyl 7 days to her regimen. 2) A urinalysis was done in clinic.  Likely negative for UTI.  A urine culture is pending. 3) Follow up with your PCP or urologist if not improving or if worsening symptoms.   Marlaine HindJeffrey H Henderson, MD 01/30/14 [phone]

## 2014-01-30 NOTE — ED Notes (Signed)
Pt c/o lower abd and back pain with frequent urination x 1 day. Denies fever.

## 2014-02-01 ENCOUNTER — Telehealth: Payer: Self-pay | Admitting: Emergency Medicine

## 2014-02-01 LAB — URINE CULTURE
Colony Count: NO GROWTH
Organism ID, Bacteria: NO GROWTH

## 2014-02-01 NOTE — ED Notes (Signed)
Inquired about patient's status; encourage them to call with questions/concerns.  

## 2014-02-15 ENCOUNTER — Encounter: Payer: Self-pay | Admitting: Emergency Medicine

## 2014-02-15 ENCOUNTER — Emergency Department
Admission: EM | Admit: 2014-02-15 | Discharge: 2014-02-15 | Disposition: A | Discharge status: Home / Self Care | Payer: BC Managed Care – PPO | Source: Home / Self Care | Attending: Family Medicine | Admitting: Family Medicine

## 2014-02-15 DIAGNOSIS — N3001 Acute cystitis with hematuria: Secondary | ICD-10-CM

## 2014-02-15 DIAGNOSIS — N3000 Acute cystitis without hematuria: Secondary | ICD-10-CM

## 2014-02-15 LAB — POCT URINALYSIS DIP (MANUAL ENTRY)
BILIRUBIN UA: NEGATIVE
Bilirubin, UA: NEGATIVE
GLUCOSE UA: NEGATIVE
Nitrite, UA: NEGATIVE
Protein Ur, POC: NEGATIVE
SPEC GRAV UA: 1.02 (ref 1.005–1.03)
Urobilinogen, UA: 0.2 (ref 0–1)
pH, UA: 7 (ref 5–8)

## 2014-02-15 MED ORDER — PHENAZOPYRIDINE HCL 200 MG PO TABS: 200.0000 mg | ORAL_TABLET | Freq: Three times a day (TID) | ORAL | Status: DC

## 2014-02-15 MED ORDER — NITROFURANTOIN MONOHYD MACRO 100 MG PO CAPS: 100.0000 mg | ORAL_CAPSULE | Freq: Two times a day (BID) | ORAL | Status: DC

## 2014-02-15 NOTE — ED Provider Notes (Signed)
CSN: [id number]     Arrival date & time 02/15/14  7829 History   First MD Initiated Contact with Patient 02/15/14 [phone]     Chief Complaint  Patient presents with  . Polyuria     HPI Comments: Patient complains of two day history of low back ache, frequency, dysuria, nocturia, hesitancy, and cloudy urine.  No abdominal or pelvic pain.  She notes that her last menstrual period was 12/29/2013.  She changes her NuvaRing tomorrow.  No fevers, chills, and sweats.  She has had loose stools but no nausea/vomiting.   Patient is a 23 y.o. female presenting with dysuria. The history is provided by the patient.  Dysuria Pain quality:  Burning Pain severity:  Mild Onset quality:  Sudden Duration:  2 days Timing:  Constant Progression:  Unchanged Chronicity:  New Recent urinary tract infections: no   Relieved by:  None tried Worsened by:  Nothing tried Ineffective treatments:  None tried Urinary symptoms: discolored urine, frequent urination and hesitancy   Urinary symptoms: no foul-smelling urine, no hematuria and no bladder incontinence   Associated symptoms: nausea   Associated symptoms: no abdominal pain, no fever, no flank pain, no genital lesions, no vaginal discharge and no vomiting     History reviewed. No pertinent past medical history. Past Surgical History  Procedure Laterality Date  . Tonsillectomy    . Tonsillectomy and adenoidectomy     Family History  Problem Relation Age of Onset  . Cancer Maternal Aunt   . Hypertension Mother   . Hyperlipidemia Father   . Hyperlipidemia Maternal Grandmother   . Hyperlipidemia Maternal Grandfather   . Hypertension Maternal Grandfather   . Hyperlipidemia Paternal Grandmother   . Hyperlipidemia Paternal Grandfather    History  Substance Use Topics  . Smoking status: Never Smoker   . Smokeless tobacco: Never Used  . Alcohol Use: No   OB History   Grav Para Term Preterm Abortions TAB SAB Ect Mult Living                 Review of  Systems  Constitutional: Negative for fever.  Gastrointestinal: Positive for nausea. Negative for vomiting and abdominal pain.  Genitourinary: Positive for dysuria. Negative for flank pain and vaginal discharge.  All other systems reviewed and are negative.   Allergies  Aspartame and phenylalanine; Benzodiazepines; Clarithromycin; and Penicillins  Home Medications   Prior to Admission medications   Medication Sig Start Date End Date Taking? Authorizing Provider  atenolol (TENORMIN) 25 MG tablet TAKE 1 TABLET (25 MG TOTAL) BY MOUTH DAILY.    Monica Becton, MD  cetirizine (ZYRTEC) 1 MG/ML syrup Take by mouth daily.    Historical Provider, MD  etonogestrel-ethinyl estradiol (NUVARING) 0.12-0.015 MG/24HR vaginal ring Place 1 each vaginally every 28 (twenty-eight) days. Insert vaginally and leave in place for 3 consecutive weeks, then remove for 1 week.    Historical Provider, MD  fluconazole (DIFLUCAN) 150 MG tablet Take 1 tablet (150 mg total) by mouth once. May repeat in 3 days 01/30/14   Marlaine Hind, MD  fluticasone [facility]) 50 MCG/ACT nasal spray One spray in each nostril twice a day, use left hand for right nostril, and right hand for left nostril. 11/16/12   Monica Becton, MD  phentermine 37.5 MG capsule Take 1 capsule (37.5 mg total) by mouth every morning. 10/25/13   Monica Becton, MD  Topiramate ER (TROKENDI XR) 100 MG CP24 Take by mouth.    Historical Provider, MD  venlafaxine XR (EFFEXOR-XR) 150 MG 24 hr capsule TAKE ONE CAPSULE EVERY DAY 01/05/14   Monica Bectonhomas J Thekkekandam, MD   BP 114/76  Pulse 78  Temp(Src) 99.4 F (37.4 C) (Oral)  Ht 5\' 1"  (1.549 m)  Wt 254 lb (115.214 kg)  BMI 48.02 kg/m2  SpO2 96%  LMP 12/29/2013 Physical Exam Nursing notes and Vital Signs reviewed. Appearance:  Patient appears stated age, and in no acute distress.  Patient is obese (BMI 48.0) Eyes:  Pupils are equal, round, and reactive to light and accomodation.  Extraocular  movement is intact.  Conjunctivae are not inflamed  Pharynx:  Normal; moist mucous membranes  Neck:  Supple.  No adenopathy Lungs:  Clear to auscultation.  Breath sounds are equal.  Heart:  Regular rate and rhythm without murmurs, rubs, or gallops.  Abdomen:   Mild tenderness over bladder without masses or hepatosplenomegaly.  Bowel sounds are present.  No CVA or flank tenderness.  Extremities:  No edema.  No calf tenderness Skin:  No rash present.   ED Course  Procedures      Labs Reviewed  URINE CULTURE  POCT URINALYSIS DIP (MANUAL ENTRY):  BLO trace lysed; LEU moderate; otherwise negative         MDM   1. Acute cystitis with hematuria    Urine culture pending.  Begin Macrobid and Pyridium Increase fluid intake. Followup with Family Doctor if not improved in one week.     Lattie HawStephen A Kaevon Cotta, MD 02/15/14 [phone]

## 2014-02-15 NOTE — ED Notes (Signed)
Polyuria, LBP, bladder pain, cloudy urine x 2 days

## 2014-02-15 NOTE — Discharge Instructions (Signed)
Increase fluid intake ° ° °Urinary Tract Infection °Urinary tract infections (UTIs) can develop anywhere along your urinary tract. Your urinary tract is your body's drainage system for removing wastes and extra water. Your urinary tract includes two kidneys, two ureters, a bladder, and a urethra. Your kidneys are a pair of bean-shaped organs. Each kidney is about the size of your fist. They are located below your ribs, one on each side of your spine. °CAUSES °Infections are caused by microbes, which are microscopic organisms, including fungi, viruses, and bacteria. These organisms are so small that they can only be seen through a microscope. Bacteria are the microbes that most commonly cause UTIs. °SYMPTOMS  °Symptoms of UTIs may vary by age and gender of the patient and by the location of the infection. Symptoms in young women typically include a frequent and intense urge to urinate and a painful, burning feeling in the bladder or urethra during urination. Older women and men are more likely to be tired, shaky, and weak and have muscle aches and abdominal pain. A fever may mean the infection is in your kidneys. Other symptoms of a kidney infection include pain in your back or sides below the ribs, nausea, and vomiting. °DIAGNOSIS °To diagnose a UTI, your caregiver will ask you about your symptoms. Your caregiver also will ask to provide a urine sample. The urine sample will be tested for bacteria and white blood cells. White blood cells are made by your body to help fight infection. °TREATMENT  °Typically, UTIs can be treated with medication. Because most UTIs are caused by a bacterial infection, they usually can be treated with the use of antibiotics. The choice of antibiotic and length of treatment depend on your symptoms and the type of bacteria causing your infection. °HOME CARE INSTRUCTIONS °· If you were prescribed antibiotics, take them exactly as your caregiver instructs you. Finish the medication even if  you feel better after you have only taken some of the medication. °· Drink enough water and fluids to keep your urine clear or pale yellow. °· Avoid caffeine, tea, and carbonated beverages. They tend to irritate your bladder. °· Empty your bladder often. Avoid holding urine for long periods of time. °· Empty your bladder before and after sexual intercourse. °· After a bowel movement, women should cleanse from front to back. Use each tissue only once. °SEEK MEDICAL CARE IF:  °· You have back pain. °· You develop a fever. °· Your symptoms do not begin to resolve within 3 days. °SEEK IMMEDIATE MEDICAL CARE IF:  °· You have severe back pain or lower abdominal pain. °· You develop chills. °· You have nausea or vomiting. °· You have continued burning or discomfort with urination. °MAKE SURE YOU:  °· Understand these instructions. °· Will watch your condition. °· Will get help right away if you are not doing well or get worse. °Document Released: 04/29/2005 Document Revised: 01/19/2012 Document Reviewed: 08/28/2011 °ExitCare® Patient Information ©2015 [employer]. This information is not intended to replace advice given to you by your health care provider. Make sure you discuss any questions you have with your health care provider. ° °

## 2014-02-16 LAB — URINE CULTURE
COLONY COUNT: NO GROWTH
ORGANISM ID, BACTERIA: NO GROWTH

## 2014-02-17 ENCOUNTER — Telehealth: Payer: Self-pay | Admitting: Emergency Medicine

## 2014-02-17 NOTE — ED Notes (Signed)
Inquired about patient's status; encourage them to call with questions/concerns.  

## 2014-03-06 ENCOUNTER — Ambulatory Visit: Payer: Self-pay | Admitting: Sports Medicine

## 2014-05-14 ENCOUNTER — Ambulatory Visit (INDEPENDENT_AMBULATORY_CARE_PROVIDER_SITE_OTHER): Payer: BC Managed Care – PPO | Admitting: Sports Medicine

## 2014-05-14 ENCOUNTER — Encounter: Payer: Self-pay | Admitting: Sports Medicine

## 2014-05-14 VITALS — BP 118/74 | HR 91 | Ht 62.0 in | Wt 274.0 lb

## 2014-05-14 DIAGNOSIS — H6503 Acute serous otitis media, bilateral: Secondary | ICD-10-CM

## 2014-05-14 DIAGNOSIS — M722 Plantar fascial fibromatosis: Secondary | ICD-10-CM | POA: Diagnosis not present

## 2014-05-14 DIAGNOSIS — Z23 Encounter for immunization: Secondary | ICD-10-CM

## 2014-05-14 DIAGNOSIS — H6590 Unspecified nonsuppurative otitis media, unspecified ear: Secondary | ICD-10-CM | POA: Insufficient documentation

## 2014-05-14 MED ORDER — FLUTICASONE PROPIONATE 50 MCG/ACT NA SUSP: NASAL | Status: DC

## 2014-05-14 MED ORDER — AZITHROMYCIN 250 MG PO TABS: ORAL_TABLET | ORAL | Status: DC

## 2014-05-14 MED ORDER — DICLOFENAC SODIUM 75 MG PO TBEC: 75.0000 mg | DELAYED_RELEASE_TABLET | Freq: Two times a day (BID) | ORAL | Status: DC

## 2014-05-14 NOTE — Assessment & Plan Note (Signed)
Flonase, azithromycin.  

## 2014-05-14 NOTE — Progress Notes (Signed)
  Subjective:    CC: Sick  HPI: Ear pain: Bilateral, right worse than left, started 2 weeks ago, mild sore throat present. Slight change in hearing, no cough, sinus pressure, GI symptoms. Symptoms are mild, persistent.  Right heel pain: Present for years, worse in the morning, localized on the plantar aspect of the heel. Moderate, persistent.  Past medical history, Surgical history, Family history not pertinant except as noted below, Social history, Allergies, and medications have been entered into the medical record, reviewed, and no changes needed.   Review of Systems: No fevers, chills, night sweats, weight loss, chest pain, or shortness of breath.   Objective:    General: Well Developed, well nourished, and in no acute distress.  Neuro: Alert and oriented x3, extra-ocular muscles intact, sensation grossly intact.  HEENT: Normocephalic, atraumatic, pupils equal round reactive to light, neck supple, no masses, no lymphadenopathy, thyroid nonpalpable. Bilateral serous otitis media seen, oropharynx and nasopharynx unremarkable. Skin: Warm and dry, no rashes. Cardiac: Regular rate and rhythm, no murmurs rubs or gallops, no lower extremity edema.  Respiratory: Clear to auscultation bilaterally. Not using accessory muscles, speaking in full sentences. Right Foot: No visible erythema or swelling. Range of motion is full in all directions. Strength is 5/5 in all directions. No hallux valgus. No pes cavus or pes planus. No abnormal callus noted. No pain over the navicular prominence, or base of fifth metatarsal. Tender to palpation at the calcaneal insertion of plantar fascia. No pain at the Achilles insertion. No pain over the calcaneal bursa. No pain of the retrocalcaneal bursa. No tenderness to palpation over the tarsals, metatarsals, or phalanges. No hallux rigidus or limitus. No tenderness palpation over interphalangeal joints. No pain with compression of the metatarsal  heads. Neurovascularly intact distally.  Impression and Recommendations:

## 2014-05-14 NOTE — Patient Instructions (Signed)
Serous Otitis Media °Serous otitis media is fluid in the middle ear space. This space contains the bones for hearing and air. Air in the middle ear space helps to transmit sound.  °The air gets there through the eustachian tube. This tube goes from the back of the nose (nasopharynx) to the middle ear space. It keeps the pressure in the middle ear the same as the outside world. It also helps to drain fluid from the middle ear space. °CAUSES  °Serous otitis media occurs when the eustachian tube gets blocked. Blockage can come from: °· Ear infections. °· Colds and other upper respiratory infections. °· Allergies. °· Irritants such as cigarette smoke. °· Sudden changes in air pressure (such as descending in an airplane). °· Enlarged adenoids. °· A mass in the nasopharynx. °During colds and upper respiratory infections, the middle ear space can become temporarily filled with fluid. This can happen after an ear infection also. Once the infection clears, the fluid will generally drain out of the ear through the eustachian tube. If it does not, then serous otitis media occurs. °SIGNS AND SYMPTOMS  °· Hearing loss. °· A feeling of fullness in the ear, without pain. °· Young children may not show any symptoms but may show slight behavioral changes, such as agitation, ear pulling, or crying. °DIAGNOSIS  °Serous otitis media is diagnosed by an ear exam. Tests may be done to check on the movement of the eardrum. Hearing exams may also be done. °TREATMENT  °The fluid most often goes away without treatment. If allergy is the cause, allergy treatment may be helpful. Fluid that persists for several months may require minor surgery. A small tube is placed in the eardrum to: °· Drain the fluid. °· Restore the air in the middle ear space. °In certain situations, antibiotic medicines are used to avoid surgery. Surgery may be done to remove enlarged adenoids (if this is the cause). °HOME CARE INSTRUCTIONS  °· Keep children away from  tobacco smoke. °· Keep all follow-up visits as directed by your health care provider. °SEEK MEDICAL CARE IF:  °· Your hearing is not better in 3 months. °· Your hearing is worse. °· You have ear pain. °· You have drainage from the ear. °· You have dizziness. °· You have serous otitis media only in one ear or have any bleeding from your nose (epistaxis). °· You notice a lump on your neck. °MAKE SURE YOU: °· Understand these instructions.   °· Will watch your condition.   °· Will get help right away if you are not doing well or get worse.   °Document Released: 10/10/2003 Document Revised: 12/04/2013 Document Reviewed: 02/14/2013 °ExitCare® Patient Information ©2015 [employer]. This information is not intended to replace advice given to you by your health care provider. Make sure you discuss any questions you have with your health care provider. ° °

## 2014-05-14 NOTE — Assessment & Plan Note (Signed)
Rehabilitation exercises, diclofenac, return for custom orthotics.

## 2014-05-23 ENCOUNTER — Ambulatory Visit (INDEPENDENT_AMBULATORY_CARE_PROVIDER_SITE_OTHER): Payer: BC Managed Care – PPO | Admitting: Sports Medicine

## 2014-05-23 ENCOUNTER — Encounter: Payer: Self-pay | Admitting: Sports Medicine

## 2014-05-23 VITALS — BP 130/78 | HR 80 | Ht 62.0 in | Wt 276.0 lb

## 2014-05-23 DIAGNOSIS — M722 Plantar fascial fibromatosis: Secondary | ICD-10-CM | POA: Diagnosis not present

## 2014-05-23 NOTE — Assessment & Plan Note (Signed)
Custom orthotics as above. Continue rehabilitation exercises and NSAIDs. Return in a month.

## 2014-05-23 NOTE — Progress Notes (Signed)

## 2014-06-20 ENCOUNTER — Ambulatory Visit (INDEPENDENT_AMBULATORY_CARE_PROVIDER_SITE_OTHER): Payer: BC Managed Care – PPO | Admitting: Sports Medicine

## 2014-06-20 VITALS — BP 133/77 | HR 73 | Ht 62.0 in | Wt 279.0 lb

## 2014-06-20 DIAGNOSIS — M722 Plantar fascial fibromatosis: Secondary | ICD-10-CM

## 2014-06-20 NOTE — Progress Notes (Signed)
  Subjective:    ZO:[id number]:follow-up  HPI: Bilateral plantar fasciitis: Resolved with custom orthotics and conservative measures.  Past medical history, Surgical history, Family history not pertinant except as noted below, Social history, Allergies, and medications have been entered into the medical record, reviewed, and no changes needed.   Review of Systems: No fevers, chills, night sweats, weight loss, chest pain, or shortness of breath.   Objective:    General: Well Developed, well nourished, and in no acute distress.  Neuro: Alert and oriented x3, extra-ocular muscles intact, sensation grossly intact.  HEENT: Normocephalic, atraumatic, pupils equal round reactive to light, neck supple, no masses, no lymphadenopathy, thyroid nonpalpable.  Skin: Warm and dry, no rashes. Cardiac: Regular rate and rhythm, no murmurs rubs or gallops, no lower extremity edema.  Respiratory: Clear to auscultation bilaterally. Not using accessory muscles, speaking in full sentences. Bilateral Foot: No visible erythema or swelling. Range of motion is full in all directions. Strength is 5/5 in all directions. No hallux valgus. No pes cavus or pes planus. No abnormal callus noted. No pain over the navicular prominence, or base of fifth metatarsal. No tenderness to palpation of the calcaneal insertion of plantar fascia. No pain at the Achilles insertion. No pain over the calcaneal bursa. No pain of the retrocalcaneal bursa. No tenderness to palpation over the tarsals, metatarsals, or phalanges. No hallux rigidus or limitus. No tenderness palpation over interphalangeal joints. No pain with compression of the metatarsal heads. Neurovascularly intact distally.  Impression and Recommendations:

## 2014-06-20 NOTE — Assessment & Plan Note (Signed)
Resolved with conservative measures, return as needed. 

## 2014-07-15 ENCOUNTER — Emergency Department (INDEPENDENT_AMBULATORY_CARE_PROVIDER_SITE_OTHER)
Admission: EM | Admit: 2014-07-15 | Discharge: 2014-07-15 | Disposition: A | Discharge status: Home / Self Care | Payer: BC Managed Care – PPO | Source: Home / Self Care | Attending: Family Medicine | Admitting: Family Medicine

## 2014-07-15 ENCOUNTER — Encounter: Payer: Self-pay | Admitting: *Deleted

## 2014-07-15 DIAGNOSIS — J309 Allergic rhinitis, unspecified: Secondary | ICD-10-CM

## 2014-07-15 DIAGNOSIS — N3090 Cystitis, unspecified without hematuria: Secondary | ICD-10-CM

## 2014-07-15 LAB — POCT URINALYSIS DIP (MANUAL ENTRY)
BILIRUBIN UA: NEGATIVE
GLUCOSE UA: NEGATIVE
Ketones, POC UA: NEGATIVE
NITRITE UA: NEGATIVE
Protein Ur, POC: NEGATIVE
Spec Grav, UA: 1.025
UROBILINOGEN UA: 0.2
pH, UA: 6.5

## 2014-07-15 MED ORDER — SULFAMETHOXAZOLE-TRIMETHOPRIM 800-160 MG PO TABS: 1.0000 | ORAL_TABLET | Freq: Two times a day (BID) | ORAL | Status: DC

## 2014-07-15 NOTE — ED Provider Notes (Signed)
CSN: [id number]     Arrival date & time 07/15/14  1721 History   First MD Initiated Contact with Patient 07/15/14 1802     Chief Complaint  Patient presents with  . Urinary Frequency      HPI Comments: Patient complains of one week history of increased urine frequency, urgency, nocturia, and low back ache.  No abdominal or pelvic pain.  She has a history of allergic rhinitis, and has noted increased sinus congestion for several days.  The history is provided by the patient.    History reviewed. No pertinent past medical history. Past Surgical History  Procedure Laterality Date  . Tonsillectomy    . Tonsillectomy and adenoidectomy     Family History  Problem Relation Age of Onset  . Cancer Maternal Aunt   . Hypertension Mother   . Hyperlipidemia Father   . Hyperlipidemia Maternal Grandmother   . Hyperlipidemia Maternal Grandfather   . Hypertension Maternal Grandfather   . Hyperlipidemia Paternal Grandmother   . Hyperlipidemia Paternal Grandfather    History  Substance Use Topics  . Smoking status: Never Smoker   . Smokeless tobacco: Never Used  . Alcohol Use: No   OB History    No data available     Review of Systems No sore throat No cough No pleuritic pain No wheezing + nasal congestion + post-nasal drainage + sinus pain/pressure No itchy/red eyes ? earache No hemoptysis No SOB No fever/chills No nausea No vomiting No abdominal pain + diarrhea, resolved + urinary symptoms No skin rash No fatigue No myalgias No headache Used OTC meds without relief  Allergies  Aspartame and phenylalanine; Benzodiazepines; Clarithromycin; and Penicillins  Home Medications   Prior to Admission medications   Medication Sig Start Date End Date Taking? Authorizing Provider  atenolol (TENORMIN) 25 MG tablet TAKE 1 TABLET (25 MG TOTAL) BY MOUTH DAILY.    Monica Bectonhomas J Thekkekandam, MD  diclofenac (VOLTAREN) 75 MG EC tablet Take 1 tablet (75 mg total) by mouth 2 (two) times  daily. 05/14/14   Monica Bectonhomas J Thekkekandam, MD  etonogestrel-ethinyl estradiol (NUVARING) 0.12-0.015 MG/24HR vaginal ring Place 1 each vaginally every 28 (twenty-eight) days. Insert vaginally and leave in place for 3 consecutive weeks, then remove for 1 week.    Historical Provider, MD  fluticasone (FLONASE) 50 MCG/ACT nasal spray One spray in each nostril twice a day, use left hand for right nostril, and right hand for left nostril. 05/14/14   Monica Bectonhomas J Thekkekandam, MD  nitrofurantoin, macrocrystal-monohydrate, (MACROBID) 100 MG capsule Take 1 capsule (100 mg total) by mouth 2 (two) times daily. Take with food. 02/15/14   Lattie HawStephen A Beese, MD  phenazopyridine (PYRIDIUM) 200 MG tablet Take 1 tablet (200 mg total) by mouth 3 (three) times daily. Take after meals. 02/15/14   Lattie HawStephen A Beese, MD  phentermine 37.5 MG capsule Take 1 capsule (37.5 mg total) by mouth every morning. 10/25/13   Monica Bectonhomas J Thekkekandam, MD  sulfamethoxazole-trimethoprim (BACTRIM DS,SEPTRA DS) 800-160 MG per tablet Take 1 tablet by mouth 2 (two) times daily. 07/15/14   Lattie HawStephen A Beese, MD  Topiramate ER (TROKENDI XR) 100 MG CP24 Take by mouth.    Historical Provider, MD  venlafaxine XR (EFFEXOR-XR) 150 MG 24 hr capsule TAKE ONE CAPSULE EVERY DAY 01/05/14   Monica Bectonhomas J Thekkekandam, MD   BP 92/67 mmHg  Pulse 86  Temp(Src) 98.6 F (37 C) (Oral)  Ht 5\' 2"  (1.575 m)  Wt 276 lb (125.193 kg)  BMI 50.47 kg/m2  SpO2 97% Physical Exam Nursing notes and Vital Signs reviewed. Appearance:  Patient appears healthy, stated age, and in no acute distress Eyes:  Pupils are equal, round, and reactive to light and accomodation.  Extraocular movement is intact.  Conjunctivae are not inflamed  Ears:  Canals normal.  Tympanic membranes normal.  Nose:  Mildly congested turbinates.  No sinus tenderness.   Pharynx:  Normal Neck:  Supple.   Tender enlarged posterior nodes are palpated bilaterally  Lungs:  Clear to auscultation.  Breath sounds are equal.   Heart:  Regular rate and rhythm without murmurs, rubs, or gallops.  Abdomen:  Nontender without masses or hepatosplenomegaly.  Bowel sounds are present.  No CVA or flank tenderness.  Extremities:  No edema.  No calf tenderness Skin:  No rash present.   ED Course  Procedures  None   Labs Reviewed  URINE CULTURE  POCT URINALYSIS DIP (MANUAL ENTRY):  BLO trace-lysed; LEU small; otherwise negative         MDM   1. Cystitis   2. Allergic rhinitis, unspecified allergic rhinitis type; suspect early viral URI    Urine culture pending.  Begin Septra DS If increasing cold symptoms develop, begin the following: Take plain Mucinex (1200 mg guaifenesin) twice daily for cough and congestion.   Increase fluid intake, rest. May use Afrin nasal spray (or generic oxymetazoline) twice daily for about 5 days.  Also recommend using saline nasal spray several times daily and saline nasal irrigation (AYR is a common brand).  Use Flonase spray after Afrin spray and saline rinse. Stop all antihistamines for now, and other non-prescription cough/cold preparations. Follow-up with family doctor if not improving about 7 to10 days.     Lattie HawStephen A Beese, MD 07/19/14 [phone]

## 2014-07-15 NOTE — Discharge Instructions (Signed)
If increasing cold symptoms develop, begin the following: Take plain Mucinex (1200 mg guaifenesin) twice daily for cough and congestion.   Increase fluid intake, rest. May use Afrin nasal spray (or generic oxymetazoline) twice daily for about 5 days.  Also recommend using saline nasal spray several times daily and saline nasal irrigation (AYR is a common brand).  Use Flonase spray after Afrin spray and saline rinse. Stop all antihistamines for now, and other non-prescription cough/cold preparations. Follow-up with family doctor if not improving about 7 to10 days.    Urinary Tract Infection Urinary tract infections (UTIs) can develop anywhere along your urinary tract. Your urinary tract is your body's drainage system for removing wastes and extra water. Your urinary tract includes two kidneys, two ureters, a bladder, and a urethra. Your kidneys are a pair of bean-shaped organs. Each kidney is about the size of your fist. They are located below your ribs, one on each side of your spine. CAUSES Infections are caused by microbes, which are microscopic organisms, including fungi, viruses, and bacteria. These organisms are so small that they can only be seen through a microscope. Bacteria are the microbes that most commonly cause UTIs. SYMPTOMS  Symptoms of UTIs may vary by age and gender of the patient and by the location of the infection. Symptoms in young women typically include a frequent and intense urge to urinate and a painful, burning feeling in the bladder or urethra during urination. Older women and men are more likely to be tired, shaky, and weak and have muscle aches and abdominal pain. A fever may mean the infection is in your kidneys. Other symptoms of a kidney infection include pain in your back or sides below the ribs, nausea, and vomiting. DIAGNOSIS To diagnose a UTI, your caregiver will ask you about your symptoms. Your caregiver also will ask to provide a urine sample. The urine sample  will be tested for bacteria and white blood cells. White blood cells are made by your body to help fight infection. TREATMENT  Typically, UTIs can be treated with medication. Because most UTIs are caused by a bacterial infection, they usually can be treated with the use of antibiotics. The choice of antibiotic and length of treatment depend on your symptoms and the type of bacteria causing your infection. HOME CARE INSTRUCTIONS  If you were prescribed antibiotics, take them exactly as your caregiver instructs you. Finish the medication even if you feel better after you have only taken some of the medication.  Drink enough water and fluids to keep your urine clear or pale yellow.  Avoid caffeine, tea, and carbonated beverages. They tend to irritate your bladder.  Empty your bladder often. Avoid holding urine for long periods of time.  Empty your bladder before and after sexual intercourse.  After a bowel movement, women should cleanse from front to back. Use each tissue only once. SEEK MEDICAL CARE IF:   You have back pain.  You develop a fever.  Your symptoms do not begin to resolve within 3 days. SEEK IMMEDIATE MEDICAL CARE IF:   You have severe back pain or lower abdominal pain.  You develop chills.  You have nausea or vomiting.  You have continued burning or discomfort with urination. MAKE SURE YOU:   Understand these instructions.  Will watch your condition.  Will get help right away if you are not doing well or get worse. Document Released: 04/29/2005 Document Revised: 01/19/2012 Document Reviewed: 08/28/2011 [facility] Patient Information 2015 [place], [place]. This information is  not intended to replace advice given to you by your health care provider. Make sure you discuss any questions you have with your health care provider. ° °

## 2014-07-15 NOTE — ED Notes (Signed)
Pt has had lower back pain, strong smelling odor, feels "warm" when she urinates, and has seen a lot of sediment in her urine for the last week.  Pain 5/10

## 2014-07-18 LAB — URINE CULTURE

## 2014-07-24 ENCOUNTER — Telehealth: Payer: Self-pay | Admitting: *Deleted

## 2014-08-20 ENCOUNTER — Encounter: Payer: Self-pay | Admitting: Sports Medicine

## 2014-08-20 ENCOUNTER — Ambulatory Visit (INDEPENDENT_AMBULATORY_CARE_PROVIDER_SITE_OTHER): Payer: BLUE CROSS/BLUE SHIELD | Admitting: Sports Medicine

## 2014-08-20 DIAGNOSIS — M722 Plantar fascial fibromatosis: Secondary | ICD-10-CM | POA: Diagnosis not present

## 2014-08-20 NOTE — Assessment & Plan Note (Signed)
Has been moving over the past weekend and having some worsening of her plantar fascia symptoms. She has also been out of her custom orthotics since moving. I have recommended that she go back into the orthotics, and go back to topical icing modalities and the rehabilitation exercises. She does have a concern that this may represent gout, we will check her uric acid levels but symptoms are not classic. I'm also going to check her for metabolic causes of neuropathy.

## 2014-08-20 NOTE — Progress Notes (Signed)
  Subjective:    CC: foot pain  HPI: Kendra Nolan returns, initially she did extremely well with custom orthotics and rehabilitation exercises for plantar fasciitis, unfortunately for the past weekend she has been moving, and has noted worsening of her plantar fascia symptoms. She is not currently doing her rehabilitation exercises, and since she is moving has not been using her custom orthotics. Pain is moderate, persistent.  Past medical history, Surgical history, Family history not pertinant except as noted below, Social history, Allergies, and medications have been entered into the medical record, reviewed, and no changes needed.   Review of Systems: No fevers, chills, night sweats, weight loss, chest pain, or shortness of breath.   Objective:    General: Well Developed, well nourished, and in no acute distress.  Neuro: Alert and oriented x3, extra-ocular muscles intact, sensation grossly intact.  HEENT: Normocephalic, atraumatic, pupils equal round reactive to light, neck supple, no masses, no lymphadenopathy, thyroid nonpalpable.  Skin: Warm and dry, no rashes. Cardiac: Regular rate and rhythm, no murmurs rubs or gallops, no lower extremity edema.  Respiratory: Clear to auscultation bilaterally. Not using accessory muscles, speaking in full sentences. Right Foot: No visible erythema or swelling. Range of motion is full in all directions. Strength is 5/5 in all directions. No hallux valgus. No pes cavus or pes planus. No abnormal callus noted. No pain over the navicular prominence, or base of fifth metatarsal. Tender to palpation of the calcaneal insertion of plantar fascia. No pain at the Achilles insertion. No pain over the calcaneal bursa. No pain of the retrocalcaneal bursa. No tenderness to palpation over the tarsals, metatarsals, or phalanges. No hallux rigidus or limitus. No tenderness palpation over interphalangeal joints. No pain with compression of the metatarsal  heads. Neurovascularly intact distally.  Impression and Recommendations:

## 2014-08-21 ENCOUNTER — Ambulatory Visit: Payer: Self-pay | Admitting: Sports Medicine

## 2014-09-05 LAB — COMPREHENSIVE METABOLIC PANEL WITH GFR
ALT: 36 U/L — ABNORMAL HIGH (ref 0–35)
Albumin: 4 g/dL (ref 3.5–5.2)
Alkaline Phosphatase: 89 U/L (ref 39–117)
CO2: 20 meq/L (ref 19–32)
Calcium: 8.9 mg/dL (ref 8.4–10.5)
Glucose, Bld: 98 mg/dL (ref 70–99)
Total Bilirubin: 0.3 mg/dL (ref 0.2–1.2)

## 2014-09-05 LAB — CBC
HCT: 41.1 % (ref 36.0–46.0)
Hemoglobin: 13.9 g/dL (ref 12.0–15.0)
MCH: 31.8 pg (ref 26.0–34.0)
MCHC: 33.8 g/dL (ref 30.0–36.0)
MCV: 94.1 fL (ref 78.0–100.0)
MPV: 9.8 fL (ref 8.6–12.4)
Platelets: 348 10*3/uL (ref 150–400)
RBC: 4.37 MIL/uL (ref 3.87–5.11)
RDW: 14.2 % (ref 11.5–15.5)
WBC: 8.6 K/uL (ref 4.0–10.5)

## 2014-09-05 LAB — VITAMIN B12: Vitamin B-12: 1141 pg/mL — ABNORMAL HIGH (ref 211–911)

## 2014-09-05 LAB — COMPREHENSIVE METABOLIC PANEL
AST: 22 U/L (ref 0–37)
BUN: 13 mg/dL (ref 6–23)
Chloride: 110 mEq/L (ref 96–112)
Creat: 0.66 mg/dL (ref 0.50–1.10)
Potassium: 3.9 mEq/L (ref 3.5–5.3)
Sodium: 140 mEq/L (ref 135–145)
Total Protein: 6.3 g/dL (ref 6.0–8.3)

## 2014-09-05 LAB — URIC ACID: Uric Acid, Serum: 4.7 mg/dL (ref 2.4–7.0)

## 2014-09-05 LAB — HEMOGLOBIN A1C
Hgb A1c MFr Bld: 5.3 % (ref <5.7)
Mean Plasma Glucose: 105 mg/dL (ref <117)

## 2014-09-05 LAB — TSH: TSH: 0.84 u[IU]/mL (ref 0.350–4.500)

## 2014-09-17 ENCOUNTER — Ambulatory Visit: Payer: BLUE CROSS/BLUE SHIELD | Admitting: Sports Medicine

## 2014-09-28 ENCOUNTER — Other Ambulatory Visit: Payer: Self-pay

## 2014-09-28 DIAGNOSIS — M722 Plantar fascial fibromatosis: Secondary | ICD-10-CM

## 2014-09-28 MED ORDER — DICLOFENAC SODIUM 75 MG PO TBEC: 75.0000 mg | DELAYED_RELEASE_TABLET | Freq: Two times a day (BID) | ORAL | Status: DC

## 2014-09-28 NOTE — Telephone Encounter (Signed)
PATIENT REQUEST REFILL FOR DICLOFENAC #[phone] R WAS SENT TO [facility] PATIENT NEEDS FOLLOW UP APPT FOR REFILLS. Rhonda Cunningham,CMA

## 2014-10-29 ENCOUNTER — Other Ambulatory Visit: Payer: Self-pay | Admitting: Sports Medicine

## 2014-11-01 ENCOUNTER — Other Ambulatory Visit: Payer: Self-pay | Admitting: Sports Medicine

## 2014-11-02 NOTE — Telephone Encounter (Signed)
Left message with Pt to set up appt, filled one month supply rather than the 3 month supply that was requested. Callback information provided to schedule Pt.

## 2014-11-28 ENCOUNTER — Other Ambulatory Visit: Payer: Self-pay | Admitting: Sports Medicine

## 2015-02-26 ENCOUNTER — Other Ambulatory Visit: Payer: Self-pay | Admitting: Sports Medicine

## 2015-03-07 ENCOUNTER — Encounter: Payer: Self-pay | Admitting: Sports Medicine

## 2015-03-07 ENCOUNTER — Ambulatory Visit (INDEPENDENT_AMBULATORY_CARE_PROVIDER_SITE_OTHER): Payer: BLUE CROSS/BLUE SHIELD | Admitting: Sports Medicine

## 2015-03-07 VITALS — BP 135/82 | HR 90 | Ht 61.0 in | Wt 284.0 lb

## 2015-03-07 DIAGNOSIS — M722 Plantar fascial fibromatosis: Secondary | ICD-10-CM | POA: Diagnosis not present

## 2015-03-07 DIAGNOSIS — K219 Gastro-esophageal reflux disease without esophagitis: Secondary | ICD-10-CM | POA: Diagnosis not present

## 2015-03-07 DIAGNOSIS — F320 Major depressive disorder, single episode, mild: Secondary | ICD-10-CM

## 2015-03-07 MED ORDER — LANSOPRAZOLE 30 MG PO CPDR: 30.0000 mg | DELAYED_RELEASE_CAPSULE | Freq: Every day | ORAL | Status: DC

## 2015-03-07 MED ORDER — VENLAFAXINE HCL ER 150 MG PO CP24: ORAL_CAPSULE | ORAL | Status: DC

## 2015-03-07 NOTE — Assessment & Plan Note (Signed)
Bilateral injection, continue orthotics.

## 2015-03-07 NOTE — Assessment & Plan Note (Signed)
With significant sour brash, insufficient response with ranitidine, switching to Prevacid. If no improvement in 6 weeks I will send her to gastroenterology.

## 2015-03-07 NOTE — Assessment & Plan Note (Signed)
Doing extremely well, refilling Effexor

## 2015-03-07 NOTE — Progress Notes (Signed)
  Subjective:    CC: Bilateral foot pain  HPI: This pleasant 24 year old female with plantar fasciitis returns, at this point she continues to have pain despite rehabilitation exercises, as well as custom orthotics, she is ready to try interventional treatment.  GERD: Insufficient response with. Again, wonders what can she do next, no melanoma, hematochezia or hematemesis.  Past medical history, Surgical history, Family history not pertinant except as noted below, Social history, Allergies, and medications have been entered into the medical record, reviewed, and no changes needed.   Review of Systems: No fevers, chills, night sweats, weight loss, chest pain, or shortness of breath.   Objective:    General: Well Developed, well nourished, and in no acute distress.  Neuro: Alert and oriented x3, extra-ocular muscles intact, sensation grossly intact.  HEENT: Normocephalic, atraumatic, pupils equal round reactive to light, neck supple, no masses, no lymphadenopathy, thyroid nonpalpable.  Skin: Warm and dry, no rashes. Cardiac: Regular rate and rhythm, no murmurs rubs or gallops, no lower extremity edema.  Respiratory: Clear to auscultation bilaterally. Not using accessory muscles, speaking in full sentences.  Procedure: Real-time Ultrasound Guided Injection of right plantar fascia Device: GE Logiq E  Verbal informed consent obtained.  Time-out conducted.  Noted no overlying erythema, induration, or other signs of local infection.  Skin prepped in a sterile fashion.  Local anesthesia: Topical Ethyl chloride.  With sterile technique and under real time ultrasound guidance:  25-gauge needle advanced just deep to the origin of the calcaneus and the plantar fascia, 1 mL kenalog 40, 4 mL lidocaine injected easily. Completed without difficulty  Pain immediately resolved suggesting accurate placement of the medication.  Advised to call if fevers/chills, erythema, induration, drainage, or  persistent bleeding.  Images permanently stored and available for review in the ultrasound unit.  Impression: Technically successful ultrasound guided injection.  Procedure: Real-time Ultrasound Guided Injection of left plantar fascia Device: GE Logiq E  Verbal informed consent obtained.  Time-out conducted.  Noted no overlying erythema, induration, or other signs of local infection.  Skin prepped in a sterile fashion.  Local anesthesia: Topical Ethyl chloride.  With sterile technique and under real time ultrasound guidance:  25-gauge needle advanced just deep to the origin of the calcaneus and the plantar fascia, 1 mL kenalog 40, 4 mL lidocaine injected easily. Completed without difficulty  Pain immediately resolved suggesting accurate placement of the medication.  Advised to call if fevers/chills, erythema, induration, drainage, or persistent bleeding.  Images permanently stored and available for review in the ultrasound unit.  Impression: Technically successful ultrasound guided injection.  Impression and Recommendations:    I spent 25 minutes with this patient, greater than 50% was face-to-face time counseling regarding the above diagnoses, time was separate from procedural time

## 2015-04-05 ENCOUNTER — Emergency Department (INDEPENDENT_AMBULATORY_CARE_PROVIDER_SITE_OTHER)
Admission: EM | Admit: 2015-04-05 | Discharge: 2015-04-05 | Disposition: A | Discharge status: Home / Self Care | Payer: BLUE CROSS/BLUE SHIELD | Source: Home / Self Care | Attending: Family Medicine | Admitting: Family Medicine

## 2015-04-05 DIAGNOSIS — M7662 Achilles tendinitis, left leg: Secondary | ICD-10-CM

## 2015-04-05 DIAGNOSIS — M6588 Other synovitis and tenosynovitis, other site: Secondary | ICD-10-CM | POA: Diagnosis not present

## 2015-04-05 DIAGNOSIS — M76821 Posterior tibial tendinitis, right leg: Secondary | ICD-10-CM

## 2015-04-05 DIAGNOSIS — M778 Other enthesopathies, not elsewhere classified: Secondary | ICD-10-CM

## 2015-04-05 DIAGNOSIS — M779 Enthesopathy, unspecified: Principal | ICD-10-CM

## 2015-04-05 MED ORDER — CYCLOBENZAPRINE HCL 10 MG PO TABS: ORAL_TABLET | ORAL | Status: DC

## 2015-04-05 MED ORDER — PREDNISONE 20 MG PO TABS: 20.0000 mg | ORAL_TABLET | Freq: Two times a day (BID) | ORAL | Status: DC

## 2015-04-05 NOTE — Discharge Instructions (Signed)
Apply ice pack for 15 to 20 minutes, 3 to 4 times daily  Continue until pain decreases. Wear orthotics that had been previously prescribed.  Begin exercises as tolerated.   Achilles Tendinitis Achilles tendinitis is inflammation of the tough, cord-like band that attaches the lower muscles of your leg to your heel (Achilles tendon). It is usually caused by overusing the tendon and joint involved.  CAUSES Achilles tendinitis can happen because of:  A sudden increase in exercise or activity (such as running).  Doing the same exercises or activities (such as jumping) over and over.  Not warming up calf muscles before exercising.  Exercising in shoes that are worn out or not made for exercise.  Having arthritis or a bone growth on the back of the heel bone. This can rub against the tendon and hurt the tendon. SIGNS AND SYMPTOMS The most common symptoms are:  Pain in the back of the leg, just above the heel. The pain usually gets worse with exercise and better with rest.  Stiffness or soreness in the back of the leg, especially in the morning.  Swelling of the skin over the Achilles tendon.  Trouble standing on tiptoe. Sometimes, an Achilles tendon tears (ruptures). Symptoms of an Achilles tendon rupture can include:  Sudden, severe pain in the back of the leg.  Trouble putting weight on the foot or walking normally. DIAGNOSIS Achilles tendinitis will be diagnosed based on symptoms and a physical examination. An X-ray may be done to check if another condition is causing your symptoms. An MRI may be ordered if your health care provider suspects you may have completely torn your tendon, which is called an Achilles tendon rupture.  TREATMENT  Achilles tendinitis usually gets better over time. It can take weeks to months to heal completely. Treatment focuses on treating the symptoms and helping the injury heal. HOME CARE INSTRUCTIONS   Rest your Achilles tendon and avoid activities that  cause pain.  Apply ice to the injured area:  Put ice in a plastic bag.  Place a towel between your skin and the bag.  Leave the ice on for 20 minutes, 2-3 times a day  Try to avoid using the tendon (other than gentle range of motion) while the tendon is painful. Do not resume use until instructed by your health care provider. Then begin use gradually. Do not increase use to the point of pain. If pain does develop, decrease use and continue the above measures. Gradually increase activities that do not cause discomfort until you achieve normal use.  Do exercises to make your calf muscles stronger and more flexible. Your health care provider or physical therapist can recommend exercises for you to do.  Wrap your ankle with an elastic bandage or other wrap. This can help keep your tendon from moving too much. Your health care provider will show you how to wrap your ankle correctly.  Only take over-the-counter or prescription medicines for pain, discomfort, or fever as directed by your health care provider. SEEK MEDICAL CARE IF:   Your pain and swelling increase or pain is uncontrolled with medicines.  You develop new, unexplained symptoms or your symptoms get worse.  You are unable to move your toes or foot.  You develop warmth and swelling in your foot.  You have an unexplained temperature. MAKE SURE YOU:   Understand these instructions.  Will watch your condition.  Will get help right away if you are not doing well or get worse. Document Released: 04/29/2005  Document Revised: 05/10/2013 Document Reviewed: 03/01/2013 [facility]ExitCare Patient Information 2015 [place], [place]. This information is not intended to replace advice given to you by your health care provider. Make sure you discuss any questions you have with your health care provider.

## 2015-04-05 NOTE — ED Provider Notes (Signed)
CSN: [id number]     Arrival date & time 04/05/15  1538 History   First MD Initiated Contact with Patient 04/05/15 1703     Chief Complaint  Patient presents with  . Foot Pain    left  . Leg Pain      HPI Comments: Patient awoke five days ago with a "charlie horse" in her left dorsal foot.  The pain has persisted and today it began to radiate into her left posterior calf.  She has pain in her left foot and calf when flexing and extending her left foot.  She recalls no injury or change in physical activities.  She has a history of bilateral plantar fasciitis which has been assymptomatic recently.  Patient is a 24 y.o. female presenting with lower extremity pain. The history is provided by the patient.  Foot Pain This is a new problem. Episode onset: 5 days ago. The problem occurs constantly. The problem has been gradually worsening. Associated symptoms comments: Left posterior calf pain . The symptoms are aggravated by walking. She has tried nothing for the symptoms.    History reviewed. No pertinent past medical history. Past Surgical History  Procedure Laterality Date  . Tonsillectomy    . Tonsillectomy and adenoidectomy     Family History  Problem Relation Age of Onset  . Cancer Maternal Aunt   . Hypertension Mother   . Hyperlipidemia Father   . Hyperlipidemia Maternal Grandmother   . Hyperlipidemia Maternal Grandfather   . Hypertension Maternal Grandfather   . Hyperlipidemia Paternal Grandmother   . Hyperlipidemia Paternal Grandfather    Social History  Substance Use Topics  . Smoking status: Never Smoker   . Smokeless tobacco: Never Used  . Alcohol Use: No   OB History    No data available     Review of Systems  All other systems reviewed and are negative.   Allergies  Aspartame and phenylalanine; Benzodiazepines; Clarithromycin; and Penicillins  Home Medications   Prior to Admission medications   Medication Sig Start Date End Date Taking? Authorizing Provider    atenolol (TENORMIN) 25 MG tablet TAKE 1 TABLET (25 MG TOTAL) BY MOUTH DAILY. 11/28/14   Monica Becton, MD  cyclobenzaprine (FLEXERIL) 10 MG tablet Take one tab by mouth at bedtime for muscle spasm 04/05/15   Lattie Haw, MD  diclofenac (VOLTAREN) 75 MG EC tablet TAKE 1 TABLET (75 MG TOTAL) BY MOUTH 2 (TWO) TIMES DAILY. 11/28/14   Monica Becton, MD  EPIPEN 2-PAK 0.3 MG/0.3ML SOAJ injection INJECT AS DIRECTED 02/26/15   Monica Becton, MD  etonogestrel-ethinyl estradiol (NUVARING) 0.12-0.015 MG/24HR vaginal ring Place 1 each vaginally every 28 (twenty-eight) days. Insert vaginally and leave in place for 3 consecutive weeks, then remove for 1 week.    Historical Provider, MD  fluticasone (FLONASE) 50 MCG/ACT nasal spray One spray in each nostril twice a day, use left hand for right nostril, and right hand for left nostril. 05/14/14   Monica Becton, MD  lansoprazole (PREVACID) 30 MG capsule Take 1 capsule (30 mg total) by mouth daily at 6 PM. 03/07/15   Monica Becton, MD  predniSONE (DELTASONE) 20 MG tablet Take 1 tablet (20 mg total) by mouth 2 (two) times daily. Take with food. 04/05/15   Lattie Haw, MD  Topiramate ER (TROKENDI XR) 100 MG CP24 Take by mouth.    Historical Provider, MD  venlafaxine XR (EFFEXOR-XR) 150 MG 24 hr capsule TAKE ONE CAPSULE EVERY DAY 03/07/15  Monica Becton, MD   Meds Ordered and Administered this Visit  Medications - No data to display  BP 144/82 mmHg  Pulse 95  Temp(Src) 98.3 F (36.8 C) (Oral)  Ht 5\' 1"  (1.549 m)  Wt 281 lb 4 oz (127.574 kg)  BMI 53.17 kg/m2  SpO2 100%  LMP 03/26/2015 (Exact Date) No data found.   Physical Exam  Constitutional: She is oriented to person, place, and time. She appears well-developed and well-nourished. No distress.  Patient is obese (BMI 53.2)  HENT:  Head: Normocephalic.  Eyes: Pupils are equal, round, and reactive to light.  Neck: Normal range of motion.  Cardiovascular:  Normal heart sounds.   Pulmonary/Chest: Breath sounds normal.  Musculoskeletal: She exhibits no edema.       Legs:      Left foot: There is tenderness. There is normal range of motion, no bony tenderness, no swelling, normal capillary refill and no crepitus.       Feet:  Left foot has distinct tenderness to palpation over the extensor tendons as noted on diagram.  Pain is elicited during resisted dorsiflexion of the foot while palpating extensor tendon. There is distinct tenderness to palpation over insertion of the achilles tendon as noted on diagram. Left foot has tenderness to palpation over posterior tibial tendon extending into arch.  Pain elicited by resisted plantar flexion and resisted inversion of the ankle.   The left posterior calf has midline tenderness to palpation as noted on diagram.  There is no erythema, warmth or swelling.          Neurological: She is alert and oriented to person, place, and time.  Skin: Skin is warm and dry. No rash noted.  Nursing note and vitals reviewed.   ED Course  Procedures  None    MDM   1. Extensor tendonitis of foot   2. Tendonitis, Achilles, left   3. Posterior tibial tendinitis of right leg    Begin prednisone burst, and Flexeril at bedtime.   Apply ice pack for 15 to 20 minutes, 3 to 4 times daily  Continue until pain decreases. Wear orthotics that had been previously prescribed.  Begin exercises as tolerated. Refer to PT.  Followup with Dr. Rodney Langton after PT.    Lattie Haw, MD 04/05/15 [phone]

## 2015-04-05 NOTE — ED Notes (Signed)
Faxed E&T referral to PT on 04/05/15

## 2015-04-05 NOTE — ED Notes (Signed)
Woke up Sunday evening with a charlie horse in both legs, Left foot and leg with pain since.  Denies any injury, no bruising or swelling, pain when both extending and flexing

## 2015-04-19 ENCOUNTER — Encounter: Payer: Self-pay | Admitting: Sports Medicine

## 2015-04-19 ENCOUNTER — Ambulatory Visit (INDEPENDENT_AMBULATORY_CARE_PROVIDER_SITE_OTHER): Payer: BLUE CROSS/BLUE SHIELD | Admitting: Sports Medicine

## 2015-04-19 VITALS — BP 134/81 | HR 93 | Ht 61.0 in | Wt 283.0 lb

## 2015-04-19 DIAGNOSIS — Z23 Encounter for immunization: Secondary | ICD-10-CM | POA: Diagnosis not present

## 2015-04-19 DIAGNOSIS — M722 Plantar fascial fibromatosis: Secondary | ICD-10-CM | POA: Diagnosis not present

## 2015-04-19 DIAGNOSIS — K219 Gastro-esophageal reflux disease without esophagitis: Secondary | ICD-10-CM

## 2015-04-19 DIAGNOSIS — F320 Major depressive disorder, single episode, mild: Secondary | ICD-10-CM | POA: Diagnosis not present

## 2015-04-19 MED ORDER — ALPRAZOLAM 0.5 MG PO TBDP: 0.5000 mg | ORAL_TABLET | Freq: Two times a day (BID) | ORAL | Status: DC | PRN

## 2015-04-19 MED ORDER — LANSOPRAZOLE 30 MG PO CPDR: 30.0000 mg | DELAYED_RELEASE_CAPSULE | Freq: Every day | ORAL | Status: DC

## 2015-04-19 NOTE — Progress Notes (Signed)
  Subjective:    CC: Follow-up  HPI: Gastroesophageal reflux: Resolved with Prevacid.  Plantar fasciitis: Resolved after bilateral plantar fascia injection  Anxiety: Overall well controlled with Effexor however she is getting married in 2 weeks and is having a significant peak in stress and anxiety. Amenable to try a bit of Xanax in the meantime.  Past medical history, Surgical history, Family history not pertinant except as noted below, Social history, Allergies, and medications have been entered into the medical record, reviewed, and no changes needed.   Review of Systems: No fevers, chills, night sweats, weight loss, chest pain, or shortness of breath.   Objective:    General: Well Developed, well nourished, and in no acute distress.  Neuro: Alert and oriented x3, extra-ocular muscles intact, sensation grossly intact.  HEENT: Normocephalic, atraumatic, pupils equal round reactive to light, neck supple, no masses, no lymphadenopathy, thyroid nonpalpable.  Skin: Warm and dry, no rashes. Cardiac: Regular rate and rhythm, no murmurs rubs or gallops, no lower extremity edema.  Respiratory: Clear to auscultation bilaterally. Not using accessory muscles, speaking in full sentences.  Impression and Recommendations:

## 2015-04-19 NOTE — Assessment & Plan Note (Signed)
Overall doing extremely well. She is getting married in 2 weeks and is significantly stressed as expected, I am going to give her a bit of Xanax to use as needed.

## 2015-04-19 NOTE — Assessment & Plan Note (Signed)
Completely resolved after bilateral plantar fascia injection.

## 2015-04-19 NOTE — Assessment & Plan Note (Signed)
Well controlled with Prevacid 

## 2015-05-29 ENCOUNTER — Encounter: Payer: Self-pay | Admitting: Sports Medicine

## 2015-05-29 ENCOUNTER — Ambulatory Visit (INDEPENDENT_AMBULATORY_CARE_PROVIDER_SITE_OTHER): Payer: BLUE CROSS/BLUE SHIELD | Admitting: Sports Medicine

## 2015-05-29 DIAGNOSIS — G43009 Migraine without aura, not intractable, without status migrainosus: Secondary | ICD-10-CM | POA: Diagnosis not present

## 2015-05-29 MED ORDER — TOPIRAMATE ER 200 MG PO CAP24: 1.0000 | ORAL_CAPSULE | Freq: Every day | ORAL | Status: DC

## 2015-05-29 NOTE — Assessment & Plan Note (Signed)
Persistent migraines on 100 mg of Topamax, increasing to 200 mg daily.

## 2015-05-29 NOTE — Progress Notes (Signed)
  Subjective:    CC: Worsening migraines  HPI: This is a pleasant 24 year old female, she returns to discuss her worsening migraines, overall she was doing well on Topamax extended release 100 mg daily as well as atenolol, agreeable to go up on the dose. No change in quality of migraines.  Past medical history, Surgical history, Family history not pertinant except as noted below, Social history, Allergies, and medications have been entered into the medical record, reviewed, and no changes needed.   Review of Systems: No fevers, chills, night sweats, weight loss, chest pain, or shortness of breath.   Objective:    General: Well Developed, well nourished, and in no acute distress.  Neuro: Alert and oriented x3, extra-ocular muscles intact, sensation grossly intact. Cranial nerves II through XII are intact, motor, sensory, and coordinative functions are all intact. HEENT: Normocephalic, atraumatic, pupils equal round reactive to light, neck supple, no masses, no lymphadenopathy, thyroid nonpalpable.  Skin: Warm and dry, no rashes. Cardiac: Regular rate and rhythm, no murmurs rubs or gallops, no lower extremity edema.  Respiratory: Clear to auscultation bilaterally. Not using accessory muscles, speaking in full sentences.  Impression and Recommendations:

## 2015-07-01 ENCOUNTER — Emergency Department (INDEPENDENT_AMBULATORY_CARE_PROVIDER_SITE_OTHER)
Admission: EM | Admit: 2015-07-01 | Discharge: 2015-07-01 | Disposition: A | Discharge status: Home / Self Care | Payer: BLUE CROSS/BLUE SHIELD | Source: Home / Self Care | Attending: Family Medicine | Admitting: Family Medicine

## 2015-07-01 ENCOUNTER — Encounter: Payer: Self-pay | Admitting: Emergency Medicine

## 2015-07-01 DIAGNOSIS — N926 Irregular menstruation, unspecified: Secondary | ICD-10-CM

## 2015-07-01 DIAGNOSIS — Z8669 Personal history of other diseases of the nervous system and sense organs: Secondary | ICD-10-CM

## 2015-07-01 DIAGNOSIS — Z3200 Encounter for pregnancy test, result unknown: Secondary | ICD-10-CM | POA: Diagnosis not present

## 2015-07-01 DIAGNOSIS — R0981 Nasal congestion: Secondary | ICD-10-CM

## 2015-07-01 HISTORY — DX: Migraine, unspecified, not intractable, without status migrainosus: G43.909

## 2015-07-01 MED ORDER — PSEUDOEPHEDRINE HCL 60 MG PO TABS: 60.0000 mg | ORAL_TABLET | Freq: Four times a day (QID) | ORAL | Status: DC | PRN

## 2015-07-01 MED ORDER — IBUPROFEN 600 MG PO TABS: 600.0000 mg | ORAL_TABLET | Freq: Four times a day (QID) | ORAL | Status: DC | PRN

## 2015-07-01 NOTE — Discharge Instructions (Signed)
You may try Sudafed to help with nasal congestion and post-nasal drip as well as ibuprofen to help with sinus headaches, however, if you believe you are pregnant, you may want to wait for blood tests to confirm pregnancy as there have been rare cases of birth defects if sudafed taken during first trimester.  Ibuprofen would be "okay" to take while waiting for blood pregnancy test to come back but if it comes back positive, please be sure to follow up with an OB/GYN for ongoing prenatal care and to discuss medications you can take while pregnant.

## 2015-07-01 NOTE — ED Notes (Signed)
Patient has experienced sinus congestion and pressure with trigger of migraines for past week. Patient also wants blood test to determine pregnancy due to irregular/short cycles.

## 2015-07-01 NOTE — ED Provider Notes (Signed)
CSN: [id number]     Arrival date & time 07/01/15  1651 History   First MD Initiated Contact with Patient 07/01/15 1652     Chief Complaint  Patient presents with  . Nasal Congestion  . Headache  . Possible Pregnancy   (Consider location/radiation/quality/duration/timing/severity/associated sxs/prior Treatment) HPI  Pt is a 24yo female presenting to [facility] with c/o sinus congestion and pressure for 1 week with intermittent headaches c/w her migraines.  Pt also reports mild nausea and lower abdominal pain that feels similar to menstrual cramping but pt states her cycle is always short and irregular. Pt is currently trying to become pregnant and is requesting a blood test as she has taking a urine pregnancy test at home, which was negative, but is concerned it is too early for the urine test.  Pt also questions if she has a sinus infection but denies fever or chills. Denies sick contacts. She has not tried anything for her symptoms as she states she cannot have acetaminophen because that triggers her migraines. She is unsure if she can have ibuprofen or sudafed. Denies fever, chills, vomiting or diarrhea. Denies urinary symptoms.    Past Medical History  Diagnosis Date  . Migraine    Past Surgical History  Procedure Laterality Date  . Tonsillectomy    . Tonsillectomy and adenoidectomy     Family History  Problem Relation Age of Onset  . Cancer Maternal Aunt   . Hypertension Mother   . Hyperlipidemia Father   . Hyperlipidemia Maternal Grandmother   . Hyperlipidemia Maternal Grandfather   . Hypertension Maternal Grandfather   . Hyperlipidemia Paternal Grandmother   . Hyperlipidemia Paternal Grandfather    Social History  Substance Use Topics  . Smoking status: Never Smoker   . Smokeless tobacco: Never Used  . Alcohol Use: No   OB History    No data available     Review of Systems  Constitutional: Negative for fever and chills.  HENT: Positive for congestion and sinus pressure.  Negative for ear pain, rhinorrhea, sore throat, trouble swallowing and voice change.   Respiratory: Negative for cough and shortness of breath.   Cardiovascular: Negative for chest pain and palpitations.  Gastrointestinal: Negative for nausea, vomiting, abdominal pain and diarrhea.  Genitourinary: Positive for vaginal discharge and menstrual problem ( irregular). Negative for dysuria, urgency, frequency and hematuria.  Musculoskeletal: Negative for myalgias, back pain and arthralgias.  Skin: Negative for rash.  Neurological: Positive for headaches. Negative for dizziness and light-headedness.    Allergies  Aspartame and phenylalanine; Clarithromycin; and Penicillins  Home Medications   Prior to Admission medications   Medication Sig Start Date End Date Taking? Authorizing Provider  ALPRAZolam (NIRAVAM) 0.5 MG dissolvable tablet Take 1 tablet (0.5 mg total) by mouth 2 (two) times daily as needed for anxiety. 04/19/15   Monica Bectonhomas J Thekkekandam, MD  atenolol (TENORMIN) 25 MG tablet TAKE 1 TABLET (25 MG TOTAL) BY MOUTH DAILY. 11/28/14   Monica Bectonhomas J Thekkekandam, MD  EPIPEN 2-PAK 0.3 MG/0.3ML SOAJ injection INJECT AS DIRECTED 02/26/15   Monica Bectonhomas J Thekkekandam, MD  fluticasone [facility](FLONASE) 50 MCG/ACT nasal spray One spray in each nostril twice a day, use left hand for right nostril, and right hand for left nostril. 05/14/14   Monica Bectonhomas J Thekkekandam, MD  ibuprofen (ADVIL,MOTRIN) 600 MG tablet Take 1 tablet (600 mg total) by mouth every 6 (six) hours as needed. 07/01/15   Junius FinnerErin O'Malley, PA-C  lansoprazole (PREVACID) 30 MG capsule Take 1 capsule (30 mg total)  by mouth daily at 6 PM. 04/19/15   Monica Becton, MD  pseudoephedrine (SUDAFED) 60 MG tablet Take 1 tablet (60 mg total) by mouth every 6 (six) hours as needed for congestion. 07/01/15   Junius Finner, PA-C  Topiramate ER 200 MG CP24 Take 1 tablet by mouth daily. 05/29/15   Monica Becton, MD  venlafaxine XR (EFFEXOR-XR) 150 MG 24 hr capsule  TAKE ONE CAPSULE EVERY DAY 03/07/15   Monica Becton, MD   Meds Ordered and Administered this Visit  Medications - No data to display  BP 96/62 mmHg  Pulse 81  Temp(Src) 98.2 F (36.8 C) (Oral)  Resp 16  Ht  (1.549 m)  Wt 289 lb (131.09 kg)  BMI 54.63 kg/m2  SpO2 98%  LMP 06/09/2015 No data found.   Physical Exam  Constitutional: She appears well-developed and well-nourished. No distress.  HENT:  Head: Normocephalic and atraumatic.  Right Ear: Hearing, tympanic membrane, external ear and ear canal normal.  Left Ear: Hearing, tympanic membrane, external ear and ear canal normal.  Nose: Mucosal edema present. Right sinus exhibits no maxillary sinus tenderness and no frontal sinus tenderness. Left sinus exhibits no maxillary sinus tenderness and no frontal sinus tenderness.  Mouth/Throat: Uvula is midline, oropharynx is clear and moist and mucous membranes are normal.  Eyes: Conjunctivae are normal. No scleral icterus.  Neck: Normal range of motion. Neck supple.  Cardiovascular: Normal rate, regular rhythm and normal heart sounds.   Pulmonary/Chest: Effort normal and breath sounds normal. No respiratory distress. She has no wheezes. She has no rales. She exhibits no tenderness.  Abdominal: Soft. She exhibits no distension and no mass. There is tenderness. There is no rebound, no guarding and no CVA tenderness.  Obese abdomen, soft, mild tenderness in lower abdomen. No rebound or guarding. No masses palpated.  Musculoskeletal: Normal range of motion.  Neurological: She is alert.  Skin: Skin is warm and dry. She is not diaphoretic.  Nursing note and vitals reviewed.   ED Course  Procedures (including critical care time)  Labs Review Labs Reviewed  HCG, QUANTITATIVE, PREGNANCY    Imaging Review No results found.    MDM   1. Nasal congestion   2. History of migraine   3. Possible pregnancy, not yet confirmed   4. Irregular menses    Pt c/o nasal  congestion, intermittent headaches c/w her migraines and concern for pregnancy. Sinus symptoms are mild at this time, pt is afebrile with no sinus tenderness, symptoms likely not bacterial in nature. Would like to try conservative treatment with pt first- recommend Sudafed and Ibuprofen.    For vaginal symptoms and lower abdominal cramping- mild tenderness on exam, doubt ectopic pregnancy or other emergent process taking place at this time. Pt has long hx of irregular menses.   Tests in UC- hCG quantitative blood test sent to lab.   Advised pt if she is attempting to become pregnant she may want to hold off on sudafed until blood tests come back as there have been studies that show rare cases of birth defects when taken in first trimester, however she may try ibuprofen for a short time while waiting for blood test to result. F/u with PCP and OB/GYN. Patient verbalized understanding and agreement with treatment plan.    Junius Finner, PA-C 07/01/15 1911

## 2015-07-02 ENCOUNTER — Telehealth: Payer: Self-pay | Admitting: *Deleted

## 2015-07-02 LAB — HCG, QUANTITATIVE, PREGNANCY: hCG, Beta Chain, Quant, S: <2 m[IU]/mL

## 2015-07-09 ENCOUNTER — Encounter: Payer: Self-pay | Admitting: Sports Medicine

## 2015-07-09 ENCOUNTER — Ambulatory Visit (INDEPENDENT_AMBULATORY_CARE_PROVIDER_SITE_OTHER): Payer: BLUE CROSS/BLUE SHIELD | Admitting: Sports Medicine

## 2015-07-09 VITALS — BP 102/64 | HR 78 | Temp 98.4°F | Resp 18 | Wt 287.5 lb

## 2015-07-09 DIAGNOSIS — N926 Irregular menstruation, unspecified: Secondary | ICD-10-CM | POA: Insufficient documentation

## 2015-07-09 LAB — POCT URINE PREGNANCY: Preg Test, Ur: NEGATIVE

## 2015-07-09 NOTE — Assessment & Plan Note (Signed)
Patient concerned she may be pregnant. Prior hCG, serum done in urgent care was undetectable. Patient desires repeat test. Urine pregnancy test and serum hCG done today.  Return as needed.

## 2015-07-09 NOTE — Addendum Note (Signed)
Addended by: Baird KayUGLAS, Tru Rana M on: 07/09/2015 10:18 AM   Modules accepted: Orders

## 2015-07-09 NOTE — Progress Notes (Signed)
  Subjective:    CC: Question pregnancy  HPI: This is a pleasant 24 year old female, she just got married and is trying to get pregnant, she recently went to urgent care where a serum hCG level was undetectable. She tells me she is having significant symptoms of pregnancy and desires a pregnancy test, both urine and serum today. No bleeding, no headaches, no swelling.  Past medical history, Surgical history, Family history not pertinant except as noted below, Social history, Allergies, and medications have been entered into the medical record, reviewed, and no changes needed.   Review of Systems: No fevers, chills, night sweats, weight loss, chest pain, or shortness of breath.   Objective:    General: Well Developed, well nourished, and in no acute distress.  Neuro: Alert and oriented x3, extra-ocular muscles intact, sensation grossly intact.  HEENT: Normocephalic, atraumatic, pupils equal round reactive to light, neck supple, no masses, no lymphadenopathy, thyroid nonpalpable.  Skin: Warm and dry, no rashes. Cardiac: Regular rate and rhythm, no murmurs rubs or gallops, no lower extremity edema.  Respiratory: Clear to auscultation bilaterally. Not using accessory muscles, speaking in full sentences.  Impression and Recommendations:

## 2015-07-10 LAB — HCG, QUANTITATIVE, PREGNANCY: hCG, Beta Chain, Quant, S: <2 m[IU]/mL

## 2015-08-08 ENCOUNTER — Other Ambulatory Visit: Payer: Self-pay

## 2015-08-08 DIAGNOSIS — G43009 Migraine without aura, not intractable, without status migrainosus: Secondary | ICD-10-CM

## 2015-08-08 MED ORDER — TOPIRAMATE ER 200 MG PO CAP24: 1.0000 | ORAL_CAPSULE | Freq: Every day | ORAL | Status: DC

## 2015-10-17 ENCOUNTER — Encounter: Payer: Self-pay | Admitting: Sports Medicine

## 2015-10-17 ENCOUNTER — Ambulatory Visit (INDEPENDENT_AMBULATORY_CARE_PROVIDER_SITE_OTHER): Payer: BLUE CROSS/BLUE SHIELD | Admitting: Sports Medicine

## 2015-10-17 VITALS — BP 120/75 | HR 82 | Temp 97.9°F | Resp 18 | Wt 271.9 lb

## 2015-10-17 DIAGNOSIS — R69 Illness, unspecified: Principal | ICD-10-CM

## 2015-10-17 DIAGNOSIS — J111 Influenza due to unidentified influenza virus with other respiratory manifestations: Secondary | ICD-10-CM

## 2015-10-17 NOTE — Assessment & Plan Note (Signed)
Negative influenza test, use over-the-counter cold and flu medications, out of work for 2 days, hydration. Return if no better in 2 weeks.

## 2015-10-17 NOTE — Patient Instructions (Signed)
Viral Infections °A viral infection can be caused by different types of viruses. Most viral infections are not serious and resolve on their own. However, some infections may cause severe symptoms and may lead to further complications. °SYMPTOMS °Viruses can frequently cause: °· Minor sore throat. °· Aches and pains. °· Headaches. °· Runny nose. °· Different types of rashes. °· Watery eyes. °· Tiredness. °· Cough. °· Loss of appetite. °· Gastrointestinal infections, resulting in nausea, vomiting, and diarrhea. °These symptoms do not respond to antibiotics because the infection is not caused by bacteria. However, you might catch a bacterial infection following the viral infection. This is sometimes called a "superinfection." Symptoms of such a bacterial infection may include: °· Worsening sore throat with pus and difficulty swallowing. °· Swollen neck glands. °· Chills and a high or persistent fever. °· Severe headache. °· Tenderness over the sinuses. °· Persistent overall ill feeling (malaise), muscle aches, and tiredness (fatigue). °· Persistent cough. °· Yellow, green, or brown mucus production with coughing. °HOME CARE INSTRUCTIONS  °· Only take over-the-counter or prescription medicines for pain, discomfort, diarrhea, or fever as directed by your caregiver. °· Drink enough water and fluids to keep your urine clear or pale yellow. Sports drinks can provide valuable electrolytes, sugars, and hydration. °· Get plenty of rest and maintain proper nutrition. Soups and broths with crackers or rice are fine. °SEEK IMMEDIATE MEDICAL CARE IF:  °· You have severe headaches, shortness of breath, chest pain, neck pain, or an unusual rash. °· You have uncontrolled vomiting, diarrhea, or you are unable to keep down fluids. °· You or your child has an oral temperature above 102° F (38.9° C), not controlled by medicine. °· Your baby is older than 3 months with a rectal temperature of 102° F (38.9° C) or higher. °· Your baby is 3  months old or younger with a rectal temperature of 100.4° F (38° C) or higher. °MAKE SURE YOU:  °· Understand these instructions. °· Will watch your condition. °· Will get help right away if you are not doing well or get worse. °  °This information is not intended to replace advice given to you by your health care provider. Make sure you discuss any questions you have with your health care provider. °  °Document Released: 04/29/2005 Document Revised: 10/12/2011 Document Reviewed: 12/26/2014 °Elsevier Interactive Patient Education ©2016 [employer] ° °

## 2015-10-17 NOTE — Progress Notes (Signed)
  Subjective:    CC: Feeling sick  HPI: This is a pleasant 25 year old female, she comes in with a 2 day history of exposure to influenza sequent fevers, chills, muscle aches, body aches, cough, no shortness of breath, no chest pain. No skin rashes, no GI symptoms.  Past medical history, Surgical history, Family history not pertinant except as noted below, Social history, Allergies, and medications have been entered into the medical record, reviewed, and no changes needed.   Review of Systems: No fevers, chills, night sweats, weight loss, chest pain, or shortness of breath.   Objective:    General: Well Developed, well nourished, and in no acute distress.  Neuro: Alert and oriented x3, extra-ocular muscles intact, sensation grossly intact.  HEENT: Normocephalic, atraumatic, pupils equal round reactive to light, neck supple, no masses, no lymphadenopathy, thyroid nonpalpable. Oropharynx, nasopharynx, ear canals unremarkable. Skin: Warm and dry, no rashes. Cardiac: Regular rate and rhythm, no murmurs rubs or gallops, no lower extremity edema.  Respiratory: Clear to auscultation bilaterally. Not using accessory muscles, speaking in full sentences.  Impression and Recommendations:    I spent 25 minutes with this patient, greater than 50% was face-to-face time counseling regarding the above diagnoses

## 2015-11-24 ENCOUNTER — Other Ambulatory Visit: Payer: Self-pay | Admitting: Sports Medicine

## 2015-12-10 ENCOUNTER — Encounter: Payer: Self-pay | Admitting: Sports Medicine

## 2015-12-10 ENCOUNTER — Ambulatory Visit (INDEPENDENT_AMBULATORY_CARE_PROVIDER_SITE_OTHER): Payer: BLUE CROSS/BLUE SHIELD | Admitting: Sports Medicine

## 2015-12-10 VITALS — BP 105/69 | HR 86 | Resp 18 | Wt 284.8 lb

## 2015-12-10 DIAGNOSIS — N970 Female infertility associated with anovulation: Secondary | ICD-10-CM

## 2015-12-10 DIAGNOSIS — E669 Obesity, unspecified: Secondary | ICD-10-CM

## 2015-12-10 MED ORDER — ONDANSETRON 8 MG PO TBDP: 8.0000 mg | ORAL_TABLET | Freq: Three times a day (TID) | ORAL | Status: DC | PRN

## 2015-12-10 MED ORDER — LIRAGLUTIDE -WEIGHT MANAGEMENT 18 MG/3ML ~~LOC~~ SOPN: 3.0000 mg | PEN_INJECTOR | Freq: Every day | SUBCUTANEOUS | Status: DC

## 2015-12-10 NOTE — Progress Notes (Signed)
  Subjective:    CC: couple of issues  HPI: Obesity: Did not tolerate phentermine well, desires to try Saxenda.  Infertility: Has been told by her OB/GYN that she will not provide a referral to reproductive endocrinology and she loses weight. She's also been told that she has anovulatory cycles but has several healthy appearing follicles on ultrasound.  Past medical history, Surgical history, Family history not pertinant except as noted below, Social history, Allergies, and medications have been entered into the medical record, reviewed, and no changes needed.   Review of Systems: No fevers, chills, night sweats, weight loss, chest pain, or shortness of breath.   Objective:    General: Well Developed, well nourished, and in no acute distress.  Neuro: Alert and oriented x3, extra-ocular muscles intact, sensation grossly intact.  HEENT: Normocephalic, atraumatic, pupils equal round reactive to light, neck supple, no masses, no lymphadenopathy, thyroid nonpalpable.  Skin: Warm and dry, no rashes. Cardiac: Regular rate and rhythm, no murmurs rubs or gallops, no lower extremity edema.  Respiratory: Clear to auscultation bilaterally. Not using accessory muscles, speaking in full sentences.  Impression and Recommendations:   I spent 25 minutes with this patient, greater than 50% was face-to-face time counseling regarding the above diagnoses

## 2015-12-10 NOTE — Assessment & Plan Note (Signed)
Starting Saxenda, we discussed contrave. Return in 3 months to check weight.

## 2015-12-10 NOTE — Assessment & Plan Note (Signed)
This is per patient report from her OB/GYN visit however she tells me that previous ultrasounds have shown healthy follicles. She does need to lose some weight which I will help her do however I am going to have her touch base with reproductive endocrinology for further evaluation, and consideration of assisted reproductive technologies.

## 2015-12-11 ENCOUNTER — Telehealth: Payer: Self-pay | Admitting: *Deleted

## 2015-12-11 DIAGNOSIS — E669 Obesity, unspecified: Secondary | ICD-10-CM

## 2015-12-11 NOTE — Telephone Encounter (Signed)
Patient left a message stating she cannot afford the saxenda. Her copay is going to be $937.please advise

## 2015-12-12 ENCOUNTER — Encounter: Payer: Self-pay | Admitting: Sports Medicine

## 2015-12-12 MED ORDER — BUPROPION HCL ER (XL) 300 MG PO TB24: 300.0000 mg | ORAL_TABLET | Freq: Every day | ORAL | Status: DC

## 2015-12-12 MED ORDER — NALTREXONE HCL 50 MG PO TABS: ORAL_TABLET | ORAL | Status: DC

## 2015-12-12 MED ORDER — NALTREXONE-BUPROPION HCL ER 8-90 MG PO TB12: ORAL_TABLET | ORAL | Status: DC

## 2015-12-12 NOTE — Telephone Encounter (Signed)
Adding contrave, prescription and discount coupon here.

## 2015-12-12 NOTE — Telephone Encounter (Signed)
Left message on patients vm

## 2016-02-10 ENCOUNTER — Other Ambulatory Visit: Payer: Self-pay

## 2016-02-10 DIAGNOSIS — F320 Major depressive disorder, single episode, mild: Secondary | ICD-10-CM

## 2016-02-10 MED ORDER — BUPROPION HCL ER (XL) 300 MG PO TB24: 300.0000 mg | ORAL_TABLET | Freq: Every day | ORAL | Status: DC

## 2016-02-10 MED ORDER — VENLAFAXINE HCL ER 150 MG PO CP24: ORAL_CAPSULE | ORAL | Status: DC

## 2016-02-24 ENCOUNTER — Emergency Department (INDEPENDENT_AMBULATORY_CARE_PROVIDER_SITE_OTHER)
Admission: EM | Admit: 2016-02-24 | Discharge: 2016-02-24 | Disposition: A | Discharge status: Home / Self Care | Payer: BLUE CROSS/BLUE SHIELD | Source: Home / Self Care | Attending: Family Medicine | Admitting: Family Medicine

## 2016-02-24 ENCOUNTER — Encounter: Payer: Self-pay | Admitting: Emergency Medicine

## 2016-02-24 DIAGNOSIS — R519 Headache, unspecified: Secondary | ICD-10-CM

## 2016-02-24 DIAGNOSIS — R51 Headache: Secondary | ICD-10-CM

## 2016-02-24 DIAGNOSIS — R197 Diarrhea, unspecified: Secondary | ICD-10-CM

## 2016-02-24 DIAGNOSIS — R110 Nausea: Secondary | ICD-10-CM

## 2016-02-24 DIAGNOSIS — R0981 Nasal congestion: Secondary | ICD-10-CM

## 2016-02-24 MED ORDER — PREDNISONE 20 MG PO TABS: ORAL_TABLET | ORAL | 0 refills | Status: DC

## 2016-02-24 MED ORDER — PROMETHAZINE HCL 25 MG PO TABS: 25.0000 mg | ORAL_TABLET | Freq: Four times a day (QID) | ORAL | 0 refills | Status: DC | PRN

## 2016-02-24 NOTE — ED Provider Notes (Signed)
CSN: [id number]     Arrival date & time 02/24/16  0910 History   First MD Initiated Contact with Patient 02/24/16 [phone]     Chief Complaint  Patient presents with  . Sinus Problem    Congestion, runny nose, ears hurt  . Diarrhea    Abdominal cramps, diarrhea x 2 days   (Consider location/radiation/quality/duration/timing/severity/associated sxs/prior Treatment) HPI  Kendra Nolan is a 25 y.o. female presenting to UC with c/o gradually worsening sinus congestion, bilateral ear pain and fullness, mild intermittent recurrent generalized headaches, mild intermittent abdominal cramping, nausea without vomiting and loose stools.  Symptoms started about 2-3 days ago. She has a hx of migraines so she is hesitant to take any OTC medications for her symptoms. She has had about 5-6 episodes of loose stools over the last 24 hours. No blood or mucous in stool. Minimal non-productive cough.  Denies fever, chills, n/v/d. Denies sick contacts or recent travel.   Past Medical History:  Diagnosis Date  . Migraine    Past Surgical History:  Procedure Laterality Date  . TONSILLECTOMY    . TONSILLECTOMY AND ADENOIDECTOMY     Family History  Problem Relation Age of Onset  . Cancer Maternal Aunt   . Hypertension Mother   . Hyperlipidemia Father   . Hyperlipidemia Maternal Grandmother   . Hyperlipidemia Maternal Grandfather   . Hypertension Maternal Grandfather   . Hyperlipidemia Paternal Grandmother   . Hyperlipidemia Paternal Grandfather    Social History  Substance Use Topics  . Smoking status: Never Smoker  . Smokeless tobacco: Never Used  . Alcohol use No   OB History    No data available     Review of Systems  Constitutional: Negative for chills and fever.  HENT: Positive for congestion and ear pain ( bilateral). Negative for sore throat, trouble swallowing and voice change.   Respiratory: Negative for cough and shortness of breath.   Cardiovascular: Negative for chest pain and  palpitations.  Gastrointestinal: Positive for diarrhea ( loose stools) and nausea. Negative for abdominal pain and vomiting.  Musculoskeletal: Negative for arthralgias, back pain and myalgias.  Skin: Negative for rash.  All other systems reviewed and are negative.   Allergies  Aspartame and phenylalanine; Clarithromycin; and Penicillins  Home Medications   Prior to Admission medications   Medication Sig Start Date End Date Taking? Authorizing Provider  atenolol (TENORMIN) 25 MG tablet TAKE 1 TABLET (25 MG TOTAL) BY MOUTH DAILY. 11/24/15   Monica Becton, MD  buPROPion (WELLBUTRIN XL) 300 MG 24 hr tablet Take 1 tablet (300 mg total) by mouth daily. 02/10/16   Monica Becton, MD  EPIPEN 2-PAK 0.3 MG/0.3ML SOAJ injection INJECT AS DIRECTED 02/26/15   Monica Becton, MD  fluticasone [facility]) 50 MCG/ACT nasal spray One spray in each nostril twice a day, use left hand for right nostril, and right hand for left nostril. 05/14/14   Monica Becton, MD  lansoprazole (PREVACID) 30 MG capsule Take 1 capsule (30 mg total) by mouth daily at 6 PM. 04/19/15   Monica Becton, MD  naltrexone (DEPADE) 50 MG tablet One quarter tab twice a day 12/12/15   Monica Becton, MD  ondansetron (ZOFRAN-ODT) 8 MG disintegrating tablet Take 1 tablet (8 mg total) by mouth every 8 (eight) hours as needed for nausea. 12/10/15   Monica Becton, MD  predniSONE (DELTASONE) 20 MG tablet 3 tabs po day one, then 2 po daily x 4 days 02/24/16   Denny Peon  Gershon Mussel, PA-C  promethazine (PHENERGAN) 25 MG tablet Take 1 tablet (25 mg total) by mouth every 6 (six) hours as needed for nausea or vomiting. 02/24/16   Junius Finner, PA-C  Topiramate ER 200 MG CP24 Take 1 tablet by mouth daily. 08/08/15   Monica Becton, MD  venlafaxine XR (EFFEXOR-XR) 150 MG 24 hr capsule TAKE ONE CAPSULE EVERY DAY 02/10/16   Monica Becton, MD   Meds Ordered and Administered this Visit  Medications - No data to  display  BP 100/69 (BP Location: Left Arm)   Pulse 73   Temp 98.2 F (36.8 C) (Oral)   Ht 5\' 2"  (1.575 m)   Wt 277 lb (125.6 kg)   LMP 02/03/2016   SpO2 97%   BMI 50.66 kg/m  No data found.   Physical Exam  Constitutional: She appears well-developed and well-nourished. No distress.  HENT:  Head: Normocephalic and atraumatic.  Right Ear: Tympanic membrane normal.  Left Ear: Tympanic membrane normal.  Nose: Nose normal. Right sinus exhibits no maxillary sinus tenderness and no frontal sinus tenderness. Left sinus exhibits no maxillary sinus tenderness and no frontal sinus tenderness.  Mouth/Throat: Uvula is midline, oropharynx is clear and moist and mucous membranes are normal.  Eyes: Conjunctivae are normal. No scleral icterus.  Neck: Normal range of motion.  Cardiovascular: Normal rate, regular rhythm and normal heart sounds.   Pulmonary/Chest: Effort normal and breath sounds normal. No respiratory distress. She has no wheezes. She has no rales.  Abdominal: Soft. Bowel sounds are normal. She exhibits no distension and no mass. There is no tenderness. There is no rebound and no guarding.  Musculoskeletal: Normal range of motion.  Neurological: She is alert.  Skin: Skin is warm and dry. She is not diaphoretic.  Nursing note and vitals reviewed.   Urgent Care Course   Clinical Course    Procedures (including critical care time)  Labs Review Labs Reviewed - No data to display  Imaging Review No results found.   MDM   1. Diarrhea, unspecified type   2. Nausea   3. Sinus congestion   4. Recurrent headache    Pt c/o nasal congestion, ear pain, abdominal cramping and loose stools. Exam- unremarkable. Initial BP was low (likely false read as pt denies headache, lightheadedness, or dizziness), Recheck- WNL  Symptoms likely viral in nature. Encouraged fluids and rest.   Rx: Prednisone and phenergan Pt info packet on sinus congestion, nausea and diarrhea provided.  F/u  with PCP in 3-4 days if not improving, sooner if worsening.   Junius Finner, PA-C 02/24/16 1209

## 2016-02-27 ENCOUNTER — Ambulatory Visit (INDEPENDENT_AMBULATORY_CARE_PROVIDER_SITE_OTHER): Payer: BLUE CROSS/BLUE SHIELD | Admitting: Sports Medicine

## 2016-02-27 ENCOUNTER — Encounter: Payer: Self-pay | Admitting: Sports Medicine

## 2016-02-27 DIAGNOSIS — R1013 Epigastric pain: Secondary | ICD-10-CM | POA: Diagnosis not present

## 2016-02-27 MED ORDER — ESOMEPRAZOLE MAGNESIUM 40 MG PO CPDR: DELAYED_RELEASE_CAPSULE | ORAL | 3 refills | Status: DC

## 2016-02-27 MED ORDER — RANITIDINE HCL 300 MG PO TABS: 300.0000 mg | ORAL_TABLET | Freq: Two times a day (BID) | ORAL | 3 refills | Status: DC

## 2016-02-27 MED ORDER — GI COCKTAIL ~~LOC~~
30.0000 mL | Freq: Once | ORAL | Status: AC
  Administered 2016-02-27: 30 mL via ORAL

## 2016-02-27 NOTE — Addendum Note (Signed)
Addended by: Baird Kay on: 02/27/2016 02:26 PM   Modules accepted: Orders

## 2016-02-27 NOTE — Assessment & Plan Note (Signed)
Likely gastritis versus peptic ulcer disease. Checking CBC, CMP, lipase, amylase. Adding Nexium twice a day, ranitidine twice a day, GI cocktail.  Return to see me in one week.

## 2016-02-27 NOTE — Progress Notes (Signed)
  Subjective:    CC: Follow-up  HPI: This is a pleasant 25 year old female here for epigastric pain, she was seen in urgent care and given prednisone, Phenergan. Unfortunately she developed worsening midepigastric pain, nausea, no vomiting, no diarrhea, currently somewhat constipated, no hematochezia, melena, hematemesis. No constitutional symptoms.  Past medical history, Surgical history, Family history not pertinant except as noted below, Social history, Allergies, and medications have been entered into the medical record, reviewed, and no changes needed.   Review of Systems: No fevers, chills, night sweats, weight loss, chest pain, or shortness of breath.   Objective:    General: Well Developed, well nourished, and in no acute distress.  Neuro: Alert and oriented x3, extra-ocular muscles intact, sensation grossly intact.  HEENT: Normocephalic, atraumatic, pupils equal round reactive to light, neck supple, no masses, no lymphadenopathy, thyroid nonpalpable.  Skin: Warm and dry, no rashes. Cardiac: Regular rate and rhythm, no murmurs rubs or gallops, no lower extremity edema.  Respiratory: Clear to auscultation bilaterally. Not using accessory muscles, speaking in full sentences. Abdomen: Soft, tender to palpation in the epigastrium, nondistended, normal bowel sounds, no palpable masses, no guarding, rigidity, rebound tenderness.  Impression and Recommendations:    Abdominal pain, epigastric Likely gastritis versus peptic ulcer disease. Checking CBC, CMP, lipase, amylase. Adding Nexium twice a day, ranitidine twice a day, GI cocktail.  Return to see me in one week.   d

## 2016-02-28 LAB — CBC WITH DIFFERENTIAL/PLATELET
Basophils Absolute: 0 cells/uL (ref 0–200)
Basophils Relative: 0 %
Eosinophils Absolute: 127 {cells}/uL (ref 15–500)
Eosinophils Relative: 1 %
HCT: 45.4 % — ABNORMAL HIGH (ref 35.0–45.0)
Hemoglobin: 15.5 g/dL (ref 11.7–15.5)
Lymphocytes Relative: 28 %
Lymphs Abs: 3556 cells/uL (ref 850–3900)
MCH: 33 pg (ref 27.0–33.0)
MCHC: 34.1 g/dL (ref 32.0–36.0)
MCV: 96.8 fL (ref 80.0–100.0)
MPV: 9.5 fL (ref 7.5–12.5)
Monocytes Absolute: 1270 {cells}/uL — ABNORMAL HIGH (ref 200–950)
Monocytes Relative: 10 %
Neutro Abs: 7747 {cells}/uL (ref 1500–7800)
Neutrophils Relative %: 61 %
Platelets: 358 K/uL (ref 140–400)
RBC: 4.69 MIL/uL (ref 3.80–5.10)
RDW: 13.6 % (ref 11.0–15.0)
WBC: 12.7 10*3/uL — ABNORMAL HIGH (ref 3.8–10.8)

## 2016-02-28 LAB — COMPREHENSIVE METABOLIC PANEL WITH GFR
ALT: 21 U/L (ref 6–29)
Alkaline Phosphatase: 99 U/L (ref 33–115)
BUN: 11 mg/dL (ref 7–25)
Creat: 0.79 mg/dL (ref 0.50–1.10)
Potassium: 3.9 mmol/L (ref 3.5–5.3)
Total Protein: 6.5 g/dL (ref 6.1–8.1)

## 2016-02-28 LAB — COMPREHENSIVE METABOLIC PANEL
AST: 16 U/L (ref 10–30)
Albumin: 4.2 g/dL (ref 3.6–5.1)
CO2: 19 mmol/L — ABNORMAL LOW (ref 20–31)
Calcium: 9.1 mg/dL (ref 8.6–10.2)
Chloride: 108 mmol/L (ref 98–110)
Glucose, Bld: 84 mg/dL (ref 65–99)
Sodium: 139 mmol/L (ref 135–146)
Total Bilirubin: 0.4 mg/dL (ref 0.2–1.2)

## 2016-02-28 LAB — LIPASE: Lipase: 19 U/L (ref 7–60)

## 2016-02-28 LAB — AMYLASE: Amylase: 26 U/L (ref 0–105)

## 2016-03-09 ENCOUNTER — Encounter: Payer: Self-pay | Admitting: Emergency Medicine

## 2016-03-09 ENCOUNTER — Emergency Department (INDEPENDENT_AMBULATORY_CARE_PROVIDER_SITE_OTHER)
Admission: EM | Admit: 2016-03-09 | Discharge: 2016-03-09 | Disposition: A | Discharge status: Home / Self Care | Payer: BLUE CROSS/BLUE SHIELD | Source: Home / Self Care | Attending: Family Medicine | Admitting: Family Medicine

## 2016-03-09 DIAGNOSIS — J029 Acute pharyngitis, unspecified: Secondary | ICD-10-CM

## 2016-03-09 DIAGNOSIS — H6692 Otitis media, unspecified, left ear: Secondary | ICD-10-CM | POA: Diagnosis not present

## 2016-03-09 LAB — POCT RAPID STREP A (OFFICE): Rapid Strep A Screen: NEGATIVE

## 2016-03-09 MED ORDER — CEFDINIR 300 MG PO CAPS: 300.0000 mg | ORAL_CAPSULE | Freq: Two times a day (BID) | ORAL | 0 refills | Status: AC

## 2016-03-09 NOTE — ED Provider Notes (Signed)
CSN: [id number]     Arrival date & time 03/09/16  1019 History   First MD Initiated Contact with Patient 03/09/16 1038     Chief Complaint  Patient presents with  . Sore Throat   (Consider location/radiation/quality/duration/timing/severity/associated sxs/prior Treatment) HPI Kendra Nolan is a 25 y.o. female presenting to UC with c/o gradually worsening Left ear pain, pressure and nasal congestion with associated sore throat that is worse on the Left side of her throat. She was seen at [facility] on 02/24/16 with similar symptoms and associated stomach upset with loose stools. She was prescribed prednisone and phenergan at that time but notes she f/u with her PCP due to epigastric pain. She was taken off prednisone, given a GI cocktail and started on Nexium.  GI symptoms have resolved but sore throat, congestion and Left ear pain continue to worsen. Pain is burning and throbbing, 6/10.  She has not taken any acetaminophen or ibuprofen for pain as she is on Topiramate for migraines and was advised not to take Tylenol. She is unsure of NSAIDs so she has not tried.  Denies fever, chills, n/v/d.    Past Medical History:  Diagnosis Date  . Migraine    Past Surgical History:  Procedure Laterality Date  . TONSILLECTOMY    . TONSILLECTOMY AND ADENOIDECTOMY     Family History  Problem Relation Age of Onset  . Cancer Maternal Aunt   . Hypertension Mother   . Hyperlipidemia Father   . Hyperlipidemia Maternal Grandmother   . Hyperlipidemia Maternal Grandfather   . Hypertension Maternal Grandfather   . Hyperlipidemia Paternal Grandmother   . Hyperlipidemia Paternal Grandfather    Social History  Substance Use Topics  . Smoking status: Never Smoker  . Smokeless tobacco: Never Used  . Alcohol use No   OB History    No data available     Review of Systems  Constitutional: Negative for chills and fever.  HENT: Positive for congestion, ear pain (Left) and sore throat. Negative for trouble  swallowing and voice change.   Respiratory: Negative for cough ( minimal) and shortness of breath.   Cardiovascular: Negative for chest pain and palpitations.  Gastrointestinal: Negative for abdominal pain, diarrhea, nausea and vomiting.  Musculoskeletal: Negative for arthralgias, back pain and myalgias.  Skin: Negative for rash.  Neurological: Negative for dizziness, light-headedness and headaches.    Allergies  Aspartame and phenylalanine; Clarithromycin; and Penicillins  Home Medications   Prior to Admission medications   Medication Sig Start Date End Date Taking? Authorizing Provider  atenolol (TENORMIN) 25 MG tablet TAKE 1 TABLET (25 MG TOTAL) BY MOUTH DAILY. 11/24/15   Monica Becton, MD  buPROPion (WELLBUTRIN XL) 300 MG 24 hr tablet Take 1 tablet (300 mg total) by mouth daily. 02/10/16   Monica Becton, MD  cefdinir (OMNICEF) 300 MG capsule Take 1 capsule (300 mg total) by mouth 2 (two) times daily. 03/09/16 03/19/16  Junius Finner, PA-C  EPIPEN 2-PAK 0.3 MG/0.3ML SOAJ injection INJECT AS DIRECTED 02/26/15   Monica Becton, MD  esomeprazole (NEXIUM) 40 MG capsule One tab by mouth BID 02/27/16   Monica Becton, MD  fluticasone [facility]) 50 MCG/ACT nasal spray One spray in each nostril twice a day, use left hand for right nostril, and right hand for left nostril. 05/14/14   Monica Becton, MD  lansoprazole (PREVACID) 30 MG capsule Take 1 capsule (30 mg total) by mouth daily at 6 PM. 04/19/15   Monica Becton, MD  naltrexone (DEPADE) 50 MG tablet One quarter tab twice a day 12/12/15   Monica Bectonhomas J Thekkekandam, MD  ondansetron (ZOFRAN-ODT) 8 MG disintegrating tablet Take 1 tablet (8 mg total) by mouth every 8 (eight) hours as needed for nausea. 12/10/15   Monica Bectonhomas J Thekkekandam, MD  ranitidine (ZANTAC) 300 MG tablet Take 1 tablet (300 mg total) by mouth 2 (two) times daily. 02/27/16   Monica Bectonhomas J Thekkekandam, MD  Topiramate ER 200 MG CP24 Take 1 tablet by mouth  daily. 08/08/15   Monica Bectonhomas J Thekkekandam, MD  venlafaxine XR (EFFEXOR-XR) 150 MG 24 hr capsule TAKE ONE CAPSULE EVERY DAY 02/10/16   Monica Bectonhomas J Thekkekandam, MD   Meds Ordered and Administered this Visit  Medications - No data to display  BP 91/66 (BP Location: Left Arm)   Pulse 77   Temp 98 F (36.7 C) (Oral)   Ht 5\' 1"  (1.549 m)   Wt 275 lb (124.7 kg)   LMP 02/03/2016   SpO2 96%   BMI 51.96 kg/m  No data found.   Physical Exam  Constitutional: She appears well-developed and well-nourished. No distress.  HENT:  Head: Normocephalic and atraumatic.  Right Ear: Tympanic membrane normal.  Left Ear: Tympanic membrane is injected ( hazy in appearance). Tympanic membrane is not erythematous and not bulging.  Nose: Nose normal.  Mouth/Throat: Uvula is midline and mucous membranes are normal. Posterior oropharyngeal erythema present. No oropharyngeal exudate, posterior oropharyngeal edema or tonsillar abscesses.  Eyes: Conjunctivae are normal. No scleral icterus.  Neck: Normal range of motion. Neck supple.  Cardiovascular: Normal rate, regular rhythm and normal heart sounds.   Pulmonary/Chest: Effort normal and breath sounds normal. No stridor. No respiratory distress. She has no wheezes. She has no rales. She exhibits no tenderness.  Abdominal: Soft. She exhibits no distension. There is no tenderness.  Musculoskeletal: Normal range of motion.  Lymphadenopathy:    She has no cervical adenopathy.  Neurological: She is alert.  Skin: Skin is warm and dry. She is not diaphoretic.  Nursing note and vitals reviewed.   Urgent Care Course   Clinical Course    Procedures (including critical care time)  Labs Review Labs Reviewed - No data to display  Imaging Review No results found.  Tympanometry: Right ear- normal, Left ear- Tympanogram is Wide  MDM   1. Acute left otitis media, recurrence not specified, unspecified otitis media type   2. Sore throat    Pt c/o worsening sore  throat and Left ear pain.  Rapid strep: Negative Tympanometry c/w early onset AOM in Left ear  Pt has known hives to PCN and Clarithromycin. Pt cannot recall last antibiotic she did well on but denies hx of anaphylactic reactions.  Rx: Cefdinir, advised to stop taking immediately and call [facility] if she does develop a rash, otherwise encouraged to complete entire antibiotic course. F/u with PCP in 7-10 days if not improving, sooner if worsening. Encouraged to talk to her neurologist about taking NSAIDs for occasional pains such as her ear pain or body aches. Patient verbalized understanding and agreement with treatment plan.    Junius Finnerrin O'Malley, PA-C 03/09/16 1100

## 2016-03-09 NOTE — Discharge Instructions (Signed)
°  If you develop a rash or any other unwanted reactions to your antibiotic, please stop taking immediately and call our office for further assistance as you may need a different antibiotic.   Otherwise, please complete the entire course, even if you start to feel better to help prevent infection from coming back.   It is important you speak with your neurologist about taking NSAIDs such as ibuprofen (Motrin or Advil) or naproxen (Aleve) or aspirin on occasion for mild pain such as ear pain or other aches/pains that you may get from time to time.

## 2016-03-09 NOTE — ED Triage Notes (Signed)
Sore throat, left ear pain, sinus problems, runny nose, congestion x 3.5 days

## 2016-03-12 ENCOUNTER — Ambulatory Visit: Payer: BLUE CROSS/BLUE SHIELD | Admitting: Sports Medicine

## 2016-04-07 ENCOUNTER — Emergency Department (INDEPENDENT_AMBULATORY_CARE_PROVIDER_SITE_OTHER)
Admission: EM | Admit: 2016-04-07 | Discharge: 2016-04-07 | Disposition: A | Discharge status: Home / Self Care | Payer: BLUE CROSS/BLUE SHIELD | Source: Home / Self Care | Attending: Family Medicine | Admitting: Family Medicine

## 2016-04-07 ENCOUNTER — Encounter: Payer: Self-pay | Admitting: *Deleted

## 2016-04-07 DIAGNOSIS — J069 Acute upper respiratory infection, unspecified: Secondary | ICD-10-CM

## 2016-04-07 DIAGNOSIS — B9789 Other viral agents as the cause of diseases classified elsewhere: Principal | ICD-10-CM

## 2016-04-07 MED ORDER — BENZONATATE 100 MG PO CAPS: 100.0000 mg | ORAL_CAPSULE | Freq: Three times a day (TID) | ORAL | 0 refills | Status: DC

## 2016-04-07 NOTE — ED Triage Notes (Signed)
Pt c/o cough, nasal congestion and HA x yesterday. Other family members sick diagnosed with bronchitis.

## 2016-04-07 NOTE — ED Provider Notes (Signed)
CSN: [id number]     Arrival date & time 04/07/16  1502 History   First MD Initiated Contact with Patient 04/07/16 1541     Chief Complaint  Patient presents with  . Nasal Congestion  . Cough   (Consider location/radiation/quality/duration/timing/severity/associated sxs/prior Treatment) HPI  Kendra Nolan is a 25 y.o. female presenting to UC with c/o mild to moderately productive cough with nasal congestion and generalized headache that started yesterday.  She notes her nephew just started kindergarten and believes he brought home the illness as he has been congested and no his sister is sick as well as another family member of pt.  She has not taken anything for symptoms as she has hx of migraines and is unsure what she is able to take. Denies fever, chills, n/v/d. Denies hx of asthma. Denies chest pain or SOB.   Past Medical History:  Diagnosis Date  . Migraine    Past Surgical History:  Procedure Laterality Date  . TONSILLECTOMY    . TONSILLECTOMY AND ADENOIDECTOMY     Family History  Problem Relation Age of Onset  . Cancer Maternal Aunt   . Hypertension Mother   . Hyperlipidemia Father   . Hyperlipidemia Maternal Grandmother   . Hyperlipidemia Maternal Grandfather   . Hypertension Maternal Grandfather   . Hyperlipidemia Paternal Grandmother   . Hyperlipidemia Paternal Grandfather    Social History  Substance Use Topics  . Smoking status: Never Smoker  . Smokeless tobacco: Never Used  . Alcohol use No   OB History    No data available     Review of Systems  Constitutional: Negative for chills, fatigue and fever.  HENT: Positive for congestion, rhinorrhea and sore throat. Negative for ear pain and sinus pressure.   Respiratory: Positive for cough. Negative for choking, chest tightness, shortness of breath, wheezing and stridor.   Gastrointestinal: Negative for abdominal pain, diarrhea, nausea and vomiting.  Musculoskeletal: Negative for arthralgias and myalgias.   Neurological: Positive for headaches. Negative for dizziness and light-headedness.    Allergies  Aspartame and phenylalanine; Clarithromycin; and Penicillins  Home Medications   Prior to Admission medications   Medication Sig Start Date End Date Taking? Authorizing Provider  atenolol (TENORMIN) 25 MG tablet TAKE 1 TABLET (25 MG TOTAL) BY MOUTH DAILY. 11/24/15   Monica Bectonhomas J Thekkekandam, MD  benzonatate (TESSALON) 100 MG capsule Take 1-2 capsules (100-200 mg total) by mouth every 8 (eight) hours. 04/07/16   Junius FinnerErin O'Malley, PA-C  buPROPion (WELLBUTRIN XL) 300 MG 24 hr tablet Take 1 tablet (300 mg total) by mouth daily. 02/10/16   Monica Bectonhomas J Thekkekandam, MD  EPIPEN 2-PAK 0.3 MG/0.3ML SOAJ injection INJECT AS DIRECTED 02/26/15   Monica Bectonhomas J Thekkekandam, MD  esomeprazole (NEXIUM) 40 MG capsule One tab by mouth BID 02/27/16   Monica Bectonhomas J Thekkekandam, MD  fluticasone [facility](FLONASE) 50 MCG/ACT nasal spray One spray in each nostril twice a day, use left hand for right nostril, and right hand for left nostril. 05/14/14   Monica Bectonhomas J Thekkekandam, MD  lansoprazole (PREVACID) 30 MG capsule Take 1 capsule (30 mg total) by mouth daily at 6 PM. 04/19/15   Monica Bectonhomas J Thekkekandam, MD  naltrexone (DEPADE) 50 MG tablet One quarter tab twice a day 12/12/15   Monica Bectonhomas J Thekkekandam, MD  ondansetron (ZOFRAN-ODT) 8 MG disintegrating tablet Take 1 tablet (8 mg total) by mouth every 8 (eight) hours as needed for nausea. 12/10/15   Monica Bectonhomas J Thekkekandam, MD  ranitidine (ZANTAC) 300 MG tablet Take 1 tablet (  300 mg total) by mouth 2 (two) times daily. 02/27/16   Monica Becton, MD  Topiramate ER 200 MG CP24 Take 1 tablet by mouth daily. 08/08/15   Monica Becton, MD  venlafaxine XR (EFFEXOR-XR) 150 MG 24 hr capsule TAKE ONE CAPSULE EVERY DAY 02/10/16   Monica Becton, MD   Meds Ordered and Administered this Visit  Medications - No data to display  BP 111/76 (BP Location: Left Arm)   Pulse 79   Temp 98 F (36.7 C) (Oral)    Resp 16   Wt 280 lb (127 kg)   LMP 03/07/2016   SpO2 98%   BMI 52.91 kg/m  No data found.   Physical Exam  Constitutional: She appears well-developed and well-nourished. No distress.  HENT:  Head: Normocephalic and atraumatic.  Right Ear: Tympanic membrane normal.  Left Ear: Tympanic membrane normal.  Nose: Nose normal.  Mouth/Throat: Uvula is midline, oropharynx is clear and moist and mucous membranes are normal.  Eyes: Conjunctivae are normal. No scleral icterus.  Neck: Normal range of motion.  Cardiovascular: Normal rate, regular rhythm and normal heart sounds.   Pulmonary/Chest: Effort normal and breath sounds normal. No respiratory distress. She has no wheezes. She has no rales.  Abdominal: Soft. She exhibits no distension. There is no tenderness.  Musculoskeletal: Normal range of motion.  Neurological: She is alert.  Skin: Skin is warm and dry. She is not diaphoretic.  Nursing note and vitals reviewed.   Urgent Care Course   Clinical Course    Procedures (including critical care time)  Labs Review Labs Reviewed - No data to display  Imaging Review No results found.    MDM   1. Viral URI with cough    Pt c/o cough and congestion since yesterday. Multiple sick contacts at home with congestion. Pt appears well, NAD. Pt is afebrile. Lungs: CTAB  Symptoms likely viral in nature. Encouraged symptomatic treatment, fluids and rest.  May take OTC plain Mucinex Rx: Tessalon Home care instructions provided. F/u with PCP In 7-10 days if not improving, sooner if worsening. Patient verbalized understanding and agreement with treatment plan.     Junius Finner, PA-C 04/07/16 [phone]

## 2016-04-15 ENCOUNTER — Other Ambulatory Visit: Payer: Self-pay

## 2016-04-15 DIAGNOSIS — R1013 Epigastric pain: Secondary | ICD-10-CM

## 2016-04-15 MED ORDER — RANITIDINE HCL 300 MG PO TABS: 300.0000 mg | ORAL_TABLET | Freq: Two times a day (BID) | ORAL | 1 refills | Status: DC

## 2016-04-23 ENCOUNTER — Encounter: Payer: Self-pay | Admitting: Sports Medicine

## 2016-05-08 ENCOUNTER — Other Ambulatory Visit: Payer: Self-pay | Admitting: Sports Medicine

## 2016-05-08 DIAGNOSIS — G43009 Migraine without aura, not intractable, without status migrainosus: Secondary | ICD-10-CM

## 2016-05-25 ENCOUNTER — Ambulatory Visit: Payer: Self-pay | Admitting: Sports Medicine

## 2016-06-18 ENCOUNTER — Encounter: Payer: Self-pay | Admitting: *Deleted

## 2016-06-18 ENCOUNTER — Emergency Department
Admission: EM | Admit: 2016-06-18 | Discharge: 2016-06-18 | Disposition: A | Discharge status: Home / Self Care | Payer: BLUE CROSS/BLUE SHIELD | Source: Home / Self Care | Attending: Family Medicine | Admitting: Family Medicine

## 2016-06-18 DIAGNOSIS — J069 Acute upper respiratory infection, unspecified: Secondary | ICD-10-CM

## 2016-06-18 DIAGNOSIS — B9789 Other viral agents as the cause of diseases classified elsewhere: Secondary | ICD-10-CM

## 2016-06-18 MED ORDER — BENZONATATE 200 MG PO CAPS: 200.0000 mg | ORAL_CAPSULE | Freq: Every day | ORAL | 0 refills | Status: DC

## 2016-06-18 MED ORDER — CEFDINIR 300 MG PO CAPS: 300.0000 mg | ORAL_CAPSULE | Freq: Two times a day (BID) | ORAL | 0 refills | Status: DC

## 2016-06-18 NOTE — ED Provider Notes (Signed)
[facility]    CSN: [id number] Arrival date & time: 06/18/16  [id number]     History   Chief Complaint Chief Complaint  Patient presents with  . Sinus Problem    HPI Kendra Nolan is a 25 y.o. female.   Patient complains of four day history of typical cold-like symptoms developing over several days, including mild sore throat, sinus congestion, headache, fatigue, and cough.  No fevers, chills, and sweats.  She feels tightness in her anterior chest.  No history of asthma or pneumonia.  She states that her husband has similar symptoms.  She has a history of mild seasonal rhinitis but was assymptomatic prior to present symptoms.   The history is provided by the patient.    Past Medical History:  Diagnosis Date  . Migraine     Patient Active Problem List   Diagnosis Date Noted  . Abdominal pain, epigastric 02/27/2016  . Infertility associated with anovulation 12/10/2015  . Irregular menstrual cycle 07/09/2015  . GERD (gastroesophageal reflux disease) 03/07/2015  . Plantar fasciitis, bilateral 05/14/2014  . Major depression 02/23/2013  . Preventive measure 11/16/2012  . Migraines 11/16/2012  . Obesity 11/16/2012  . Allergic rhinitis 11/16/2012    Past Surgical History:  Procedure Laterality Date  . TONSILLECTOMY    . TONSILLECTOMY AND ADENOIDECTOMY      OB History    No data available       Home Medications    Prior to Admission medications   Medication Sig Start Date End Date Taking? Authorizing Provider  atenolol (TENORMIN) 25 MG tablet TAKE 1 TABLET (25 MG TOTAL) BY MOUTH DAILY. 11/24/15   Monica Bectonhomas J Thekkekandam, MD  benzonatate (TESSALON) 200 MG capsule Take 1 capsule (200 mg total) by mouth at bedtime. Take as needed for cough 06/18/16   Lattie HawStephen A Beese, MD  buPROPion (WELLBUTRIN XL) 300 MG 24 hr tablet Take 1 tablet (300 mg total) by mouth daily. 02/10/16   Monica Bectonhomas J Thekkekandam, MD  cefdinir (OMNICEF) 300 MG capsule Take 1 capsule (300 mg total)  by mouth 2 (two) times daily. (Rx void after 06/26/16) 06/18/16   Lattie HawStephen A Beese, MD  EPIPEN 2-PAK 0.3 MG/0.3ML SOAJ injection INJECT AS DIRECTED 02/26/15   Monica Bectonhomas J Thekkekandam, MD  esomeprazole (NEXIUM) 40 MG capsule One tab by mouth BID 02/27/16   Monica Bectonhomas J Thekkekandam, MD  fluticasone [facility](FLONASE) 50 MCG/ACT nasal spray One spray in each nostril twice a day, use left hand for right nostril, and right hand for left nostril. 05/14/14   Monica Bectonhomas J Thekkekandam, MD  lansoprazole (PREVACID) 30 MG capsule Take 1 capsule (30 mg total) by mouth daily at 6 PM. 04/19/15   Monica Bectonhomas J Thekkekandam, MD  naltrexone (DEPADE) 50 MG tablet One quarter tab twice a day 12/12/15   Monica Bectonhomas J Thekkekandam, MD  ondansetron (ZOFRAN-ODT) 8 MG disintegrating tablet Take 1 tablet (8 mg total) by mouth every 8 (eight) hours as needed for nausea. 12/10/15   Monica Bectonhomas J Thekkekandam, MD  ranitidine (ZANTAC) 300 MG tablet Take 1 tablet (300 mg total) by mouth 2 (two) times daily. 04/15/16   Monica Bectonhomas J Thekkekandam, MD  TROKENDI XR 200 MG CP24 TAKE 1 TABLET BY MOUTH DAILY. 05/08/16   Monica Bectonhomas J Thekkekandam, MD  venlafaxine XR (EFFEXOR-XR) 150 MG 24 hr capsule TAKE ONE CAPSULE EVERY DAY 02/10/16   Monica Bectonhomas J Thekkekandam, MD    Family History Family History  Problem Relation Age of Onset  . Cancer Maternal Aunt   .  Hypertension Mother   . Hyperlipidemia Father   . Hyperlipidemia Maternal Grandmother   . Hyperlipidemia Maternal Grandfather   . Hypertension Maternal Grandfather   . Hyperlipidemia Paternal Grandmother   . Hyperlipidemia Paternal Grandfather     Social History Social History  Substance Use Topics  . Smoking status: Never Smoker  . Smokeless tobacco: Never Used  . Alcohol use No     Allergies   Aspartame and phenylalanine; Clarithromycin; and Penicillins   Review of Systems Review of Systems + sore throat + cough No pleuritic pain No wheezing + nasal congestion + post-nasal drainage + sinus pain/pressure No  itchy/red eyes ? earache No hemoptysis No SOB No fever/chills No nausea No vomiting No abdominal pain No diarrhea No urinary symptoms No skin rash + fatigue + myalgias No headache Used OTC meds without relief   Physical Exam Triage Vital Signs ED Triage Vitals  Enc Vitals Group     BP 06/18/16 0842 120/86     Pulse Rate 06/18/16 0842 100     Resp 06/18/16 0842 16     Temp 06/18/16 0842 98 F (36.7 C)     Temp Source 06/18/16 0842 Oral     SpO2 06/18/16 0842 96 %     Weight 06/18/16 0842 275 lb (124.7 kg)     Height --      Head Circumference --      Peak Flow --      Pain Score 06/18/16 0844 5     Pain Loc --      Pain Edu? --      Excl. in GC? --    No data found.   Updated Vital Signs BP 120/86 (BP Location: Left Arm)   Pulse 100   Temp 98 F (36.7 C) (Oral)   Resp 16   Wt 275 lb (124.7 kg)   LMP 06/02/2016   SpO2 96%   BMI 51.96 kg/m   Visual Acuity Right Eye Distance:   Left Eye Distance:   Bilateral Distance:    Right Eye Near:   Left Eye Near:    Bilateral Near:     Physical Exam Nursing notes and Vital Signs reviewed. Appearance:  Patient appears stated age, and in no acute distress Eyes:  Pupils are equal, round, and reactive to light and accomodation.  Extraocular movement is intact.  Conjunctivae are not inflamed  Ears:  Canals normal.  Tympanic membranes normal.  Nose:  Mildly congested turbinates.  No sinus tenderness.  Pharynx:  Normal Neck:  Supple.  Tender enlarged posterior/lateral nodes are palpated bilaterally  Lungs:  Clear to auscultation.  Breath sounds are equal.  Moving air well. Chest:  Distinct tenderness to palpation over the mid-sternum.  Heart:  Regular rate and rhythm without murmurs, rubs, or gallops.  Abdomen:  Nontender without masses or hepatosplenomegaly.  Bowel sounds are present.  No CVA or flank tenderness.  Extremities:  No edema.  Skin:  No rash present.    UC Treatments / Results  Labs (all labs  ordered are listed, but only abnormal results are displayed) Labs Reviewed - No data to display  EKG  EKG Interpretation None       Radiology No results found.  Procedures Procedures (including critical care time)  Medications Ordered in UC Medications - No data to display   Initial Impression / Assessment and Plan / UC Course  I have reviewed the triage vital signs and the nursing notes.  Pertinent labs & imaging results  that were available during my care of the patient were reviewed by me and considered in my medical decision making (see chart for details).  Clinical Course   There is no evidence of bacterial infection today.   Prescription written for Benzonatate [facility]) to take at bedtime for night-time cough.   Take plain guaifenesin (1200mg  extended release tabs such as Mucinex) twice daily, with plenty of water, for cough and congestion.  May add Pseudoephedrine (30mg , one or two every 4 to 6 hours) for sinus congestion.  Get adequate rest.   May use Afrin nasal spray (or generic oxymetazoline) twice daily for about 5 days and then discontinue.  Also recommend using saline nasal spray several times daily and saline nasal irrigation (AYR is a common brand).  Use Flonase nasal spray each morning after using Afrin nasal spray and saline nasal irrigation. Try warm salt water gargles for sore throat.  Stop all antihistamines for now, and other non-prescription cough/cold preparations. May take Ibuprofen 200mg , 4 tabs every 8 hours with food for chest/sternum discomfort, body aches, etc. Begin Omnicef (cefdinir) if not improving about one week or if persistent fever develops (Given a prescription to hold, with an expiration date)  Follow-up with family doctor if not improving about10 days.     Final Clinical Impressions(s) / UC Diagnoses   Final diagnoses:  Viral URI with cough    New Prescriptions New Prescriptions   BENZONATATE (TESSALON) 200 MG CAPSULE    Take 1  capsule (200 mg total) by mouth at bedtime. Take as needed for cough   CEFDINIR (OMNICEF) 300 MG CAPSULE    Take 1 capsule (300 mg total) by mouth 2 (two) times daily. (Rx void after 06/26/16)     Lattie Haw, MD 06/18/16 [phone]

## 2016-06-18 NOTE — ED Triage Notes (Signed)
Pt c/o 3 days of nasal congestion, facail pain , green nasal drainage and ear itching. Afebrile. Taken Mucinex Sinus.

## 2016-06-18 NOTE — Discharge Instructions (Signed)
Take plain guaifenesin (1200mg  extended release tabs such as Mucinex) twice daily, with plenty of water, for cough and congestion.  May add Pseudoephedrine (30mg , one or two every 4 to 6 hours) for sinus congestion.  Get adequate rest.   May use Afrin nasal spray (or generic oxymetazoline) twice daily for about 5 days and then discontinue.  Also recommend using saline nasal spray several times daily and saline nasal irrigation (AYR is a common brand).  Use Flonase nasal spray each morning after using Afrin nasal spray and saline nasal irrigation. Try warm salt water gargles for sore throat.  Stop all antihistamines for now, and other non-prescription cough/cold preparations. May take Ibuprofen 200mg , 4 tabs every 8 hours with food for chest/sternum discomfort, body aches, etc. Begin Omnicef (cefdinir) if not improving about one week or if persistent fever develops. Follow-up with family doctor if not improving about10 days.

## 2016-07-08 ENCOUNTER — Other Ambulatory Visit: Payer: Self-pay | Admitting: Sports Medicine

## 2016-07-08 DIAGNOSIS — K219 Gastro-esophageal reflux disease without esophagitis: Secondary | ICD-10-CM

## 2016-07-10 ENCOUNTER — Ambulatory Visit (INDEPENDENT_AMBULATORY_CARE_PROVIDER_SITE_OTHER): Payer: BLUE CROSS/BLUE SHIELD | Admitting: Sports Medicine

## 2016-07-10 ENCOUNTER — Encounter: Payer: Self-pay | Admitting: Sports Medicine

## 2016-07-10 VITALS — BP 116/78 | HR 95 | Temp 98.1°F | Resp 18 | Wt 272.0 lb

## 2016-07-10 DIAGNOSIS — J069 Acute upper respiratory infection, unspecified: Secondary | ICD-10-CM | POA: Diagnosis not present

## 2016-07-10 DIAGNOSIS — H6591 Unspecified nonsuppurative otitis media, right ear: Secondary | ICD-10-CM

## 2016-07-10 MED ORDER — SULFAMETHOXAZOLE-TRIMETHOPRIM 800-160 MG PO TABS: 1.0000 | ORAL_TABLET | Freq: Two times a day (BID) | ORAL | 0 refills | Status: DC

## 2016-07-10 MED ORDER — TRIAMCINOLONE ACETONIDE 55 MCG/ACT NA AERO: 2.0000 | INHALATION_SPRAY | Freq: Every day | NASAL | 12 refills | Status: DC

## 2016-07-10 NOTE — Patient Instructions (Signed)
Otitis Media, Adult Otitis media is redness, soreness, and inflammation of the middle ear. Otitis media may be caused by allergies or, most commonly, by infection. Often it occurs as a complication of the common cold. What are the signs or symptoms? Symptoms of otitis media may include:  Earache.  Fever.  Ringing in your ear.  Headache.  Leakage of fluid from the ear. How is this diagnosed? To diagnose otitis media, your health care provider will examine your ear with an otoscope. This is an instrument that allows your health care provider to see into your ear in order to examine your eardrum. Your health care provider also will ask you questions about your symptoms. How is this treated? Typically, otitis media resolves on its own within 3-5 days. Your health care provider may prescribe medicine to ease your symptoms of pain. If otitis media does not resolve within 5 days or is recurrent, your health care provider may prescribe antibiotic medicines if he or she suspects that a bacterial infection is the cause. Follow these instructions at home:  If you were prescribed an antibiotic medicine, finish it all even if you start to feel better.  Take medicines only as directed by your health care provider.  Keep all follow-up visits as directed by your health care provider. Contact a health care provider if:  You have otitis media only in one ear, or bleeding from your nose, or both.  You notice a lump on your neck.  You are not getting better in 3-5 days.  You feel worse instead of better. Get help right away if:  You have pain that is not controlled with medicine.  You have swelling, redness, or pain around your ear or stiffness in your neck.  You notice that part of your face is paralyzed.  You notice that the bone behind your ear (mastoid) is tender when you touch it. This information is not intended to replace advice given to you by your health care provider. Make sure you  discuss any questions you have with your health care provider. Document Released: 04/24/2004 Document Revised: 12/26/2015 Document Reviewed: 02/14/2013 Elsevier Interactive Patient Education  2017 [employer] Eustachian Tube Dysfunction Introduction The eustachian tube connects the middle ear to the back of the nose. It regulates air pressure in the middle ear by allowing air to move between the ear and nose. It also helps to drain fluid from the middle ear space. When the eustachian tube does not function properly, air pressure, fluid, or both can build up in the middle ear. Eustachian tube dysfunction can affect one or both ears. What are the causes? This condition happens when the eustachian tube becomes blocked or cannot open normally. This may result from:  Ear infections.  Colds and other upper respiratory infections.  Allergies.  Irritation, such as from cigarette smoke or acid from the stomach coming up into the esophagus (gastroesophageal reflux).  Sudden changes in air pressure, such as from descending in an airplane.  Abnormal growths in the nose or throat, such as nasal polyps, tumors, or enlarged tissue at the back of the throat (adenoids). What increases the risk? This condition may be more likely to develop in people who smoke and people who are overweight. Eustachian tube dysfunction may also be more likely to develop in children, especially children who have:  Certain birth defects of the mouth, such as cleft palate.  Large tonsils and adenoids. What are the signs or symptoms? Symptoms of this condition may include:  A feeling of fullness in the ear.  Ear pain.  Clicking or popping noises in the ear.  Ringing in the ear.  Hearing loss.  Loss of balance. Symptoms may get worse when the air pressure around you changes, such as when you travel to an area of high elevation or fly on an airplane. How is this diagnosed? This condition may be diagnosed based  on:  Your symptoms.  A physical exam of your ear, nose, and throat.  Tests, such as those that measure:  The movement of your eardrum (tympanogram).  Your hearing (audiometry). How is this treated? Treatment depends on the cause and severity of your condition. If your symptoms are mild, you may be able to relieve your symptoms by moving air into ("popping") your ears. If you have symptoms of fluid in your ears, treatment may include:  Decongestants.  Antihistamines.  Nasal sprays or ear drops that contain medicines that reduce swelling (steroids). In some cases, you may need to have a procedure to drain the fluid in your eardrum (myringotomy). In this procedure, a small tube is placed in the eardrum to:  Drain the fluid.  Restore the air in the middle ear space. Follow these instructions at home:  Take over-the-counter and prescription medicines only as told by your health care provider.  Use techniques to help pop your ears as recommended by your health care provider. These may include:  Chewing gum.  Yawning.  Frequent, forceful swallowing.  Closing your mouth, holding your nose closed, and gently blowing as if you are trying to blow air out of your nose.  Do not do any of the following until your health care provider approves:  Travel to high altitudes.  Fly in airplanes.  Work in a Estate agentpressurized cabin or room.  Scuba dive.  Keep your ears dry. Dry your ears completely after showering or bathing.  Do not smoke.  Keep all follow-up visits as told by your health care provider. This is important. Contact a health care provider if:  Your symptoms do not go away after treatment.  Your symptoms come back after treatment.  You are unable to pop your ears.  You have:  A fever.  Pain in your ear.  Pain in your head or neck.  Fluid draining from your ear.  Your hearing suddenly changes.  You become very dizzy.  You lose your balance. This information  is not intended to replace advice given to you by your health care provider. Make sure you discuss any questions you have with your health care provider. Document Released: 08/16/2015 Document Revised: 12/26/2015 Document Reviewed: 08/08/2014  2017 Elsevier

## 2016-07-10 NOTE — Progress Notes (Signed)
   Subjective:    Patient ID: Kendra Nolan, female    DOB: [birth year], 25 y.o.   MRN: [id number]  URI   This is a recurrent problem. Episode onset: beginning of November. There has been no fever. Associated symptoms include coughing (productive of mucopurulent sputum) and ear pain (right-sided). Pertinent negatives include no abdominal pain, headaches, rash, sinus pain or sore throat. Treatments tried: Mucinex. The treatment provided no relief.   History of stress-induced asthma in childhood S/p adenoidectomy and tonsillectomy Denies other pulmonary problems No recent travel, sick contacts, occupational exposure, or tobacco use/exposure   Review of Systems  HENT: Positive for ear pain (right-sided). Negative for sinus pain and sore throat.   Respiratory: Positive for cough (productive of mucopurulent sputum).   Gastrointestinal: Negative for abdominal pain.  Skin: Negative for rash.  Neurological: Negative for headaches.   Past Medical History:  Diagnosis Date  . Migraine    Past Surgical History:  Procedure Laterality Date  . TONSILLECTOMY    . TONSILLECTOMY AND ADENOIDECTOMY     Social History   Social History  . Marital status: Single    Spouse name: N/A  . Number of children: N/A  . Years of education: N/A   Occupational History  . Not on file.   Social History Main Topics  . Smoking status: Never Smoker  . Smokeless tobacco: Never Used  . Alcohol use No  . Drug use: No  . Sexual activity: No   Other Topics Concern  . Not on file   Social History Narrative  . No narrative on file      Objective:   Physical Exam  Constitutional: Vital signs are normal. She does not appear ill. No distress.  HENT:  Right Ear: Hearing normal. There is tenderness. No mastoid tenderness. A middle ear effusion is present.  Left Ear: Hearing normal.  Nose: Mucosal edema present. No septal deviation. No epistaxis. Right sinus exhibits no maxillary sinus tenderness and no  frontal sinus tenderness. Left sinus exhibits no maxillary sinus tenderness and no frontal sinus tenderness.  Mouth/Throat: Mucous membranes are normal. Posterior oropharyngeal erythema present. No oropharyngeal exudate.  Serous effusion bilaterally  Cardiovascular: Normal rate, regular rhythm and normal heart sounds.   Pulmonary/Chest: Effort normal and breath sounds normal.  Neurological: She is alert.  Skin: Skin is warm and dry. No rash noted.   Vitals:   07/10/16 1331  BP: 116/78  Pulse: 95  Resp: 18  Temp: 98.1 F (36.7 C)          Assessment & Plan:  1. Otitis media with effusion, right - sulfamethoxazole-trimethoprim (BACTRIM DS,SEPTRA DS) 800-160 MG tablet; Take 1 tablet by mouth 2 (two) times daily.  Dispense: 14 tablet; Refill: 0  2. Acute upper respiratory infection - triamcinolone (NASACORT AQ) 55 MCG/ACT AERO nasal inhaler; Place 2 sprays into the nose daily.  Dispense: 1 Inhaler; Refill: 12  Patient education and anticipatory guidance given Patient agrees with treatment plan Follow-up as needed or if symptoms worsen  I spent 25 minutes with this patient, greater than 50% was face-to-face time counseling regarding the above diagnoses

## 2016-08-03 NOTE — L&D Delivery Note (Signed)
Delivery Note At 12:36 PM a viable and healthy female was delivered via Vaginal, Spontaneous (Presentation: LOA  ).  APGAR: 9, 9; weight  pending .   Placenta status: spontaneous, intact.  Cord:  with the following complications: none.  Cord pH: na  Anesthesia:  Epidural and local Episiotomy: None Lacerations: 2nd degree Suture Repair: 2.0 vicryl vicryl rapide Est. Blood Loss (mL): 200  Mom to postpartum.  Baby to Couplet care / Skin to Skin.  Kendra Nolan 07/27/2017, 1:01 PM

## 2016-09-16 ENCOUNTER — Telehealth: Payer: Self-pay | Admitting: Sports Medicine

## 2016-09-16 NOTE — Telephone Encounter (Signed)
Called pt about flu shot (Declined) 09/16/16 @ 3:05pm

## 2016-09-17 NOTE — Telephone Encounter (Signed)
Documented

## 2016-10-26 ENCOUNTER — Emergency Department
Admission: EM | Admit: 2016-10-26 | Discharge: 2016-10-26 | Disposition: A | Discharge status: Home / Self Care | Payer: BLUE CROSS/BLUE SHIELD | Source: Home / Self Care | Attending: Family Medicine | Admitting: Family Medicine

## 2016-10-26 ENCOUNTER — Encounter: Payer: Self-pay | Admitting: *Deleted

## 2016-10-26 DIAGNOSIS — B9789 Other viral agents as the cause of diseases classified elsewhere: Secondary | ICD-10-CM | POA: Diagnosis not present

## 2016-10-26 DIAGNOSIS — J069 Acute upper respiratory infection, unspecified: Secondary | ICD-10-CM

## 2016-10-26 DIAGNOSIS — R110 Nausea: Secondary | ICD-10-CM

## 2016-10-26 LAB — POCT CBC W AUTO DIFF (K'VILLE URGENT CARE)

## 2016-10-26 MED ORDER — PREDNISONE 20 MG PO TABS: ORAL_TABLET | ORAL | 0 refills | Status: DC

## 2016-10-26 MED ORDER — CEFDINIR 300 MG PO CAPS: 300.0000 mg | ORAL_CAPSULE | Freq: Two times a day (BID) | ORAL | 0 refills | Status: DC

## 2016-10-26 NOTE — Discharge Instructions (Signed)
Begin clear liquids (Pedialyte while having diarrhea) until improved, then advance to a [employer] diet (Bananas, Rice, Applesauce, Toast).  Then gradually resume a regular diet when tolerated.  Avoid milk products until well.  May continue Zofran as needed for nausea.  Take plain guaifenesin (1200mg  extended release tabs such as Mucinex) twice daily, with plenty of water, for cough and congestion.  May add Pseudoephedrine (30mg , one or two every 4 to 6 hours) for sinus congestion.  Get adequate rest.   May use Afrin nasal spray (or generic oxymetazoline) each morning for about 5 days and then discontinue.  Also recommend using saline nasal spray several times daily and saline nasal irrigation (AYR is a common brand).  Use Flonase nasal spray each morning after using Afrin nasal spray and saline nasal irrigation. Try warm salt water gargles for sore throat.  Stop all antihistamines for now, and other non-prescription cough/cold preparations. May take Delsym Cough Suppressant at bedtime for nighttime cough.  Begin Omnicef if not improving about one week or if persistent fever develops   Follow-up with family doctor if not improving about10 days.

## 2016-10-26 NOTE — ED Triage Notes (Signed)
Pt c/o nasal congestion x 2 wks. She has taken flonase and zyrtec. She also c/o upper abd pain with diarrhea and vomiting x 1 day. Denies fever.

## 2016-10-26 NOTE — ED Provider Notes (Signed)
[facility]    CSN: [id number] Arrival date & time: 10/26/16  1435     History   Chief Complaint Chief Complaint  Patient presents with  . Abdominal Pain  . Diarrhea  . Nasal Congestion    HPI Kendra Nolan is a 26 y.o. female.   Patient has had sinus congestion and post-nasal drainage for two weeks, not responding to Zyrtec and Flonase.  She has now developed a mild cough.  Yesterday she developed nausea without vomiting and sharp upper abdominal pain, followed by diarrhea (one episode).  Her nausea has persisted.  She has also developed sweats and myalgias. She has a history of seasonal rhinitis and GERD.   The history is provided by the patient.    Past Medical History:  Diagnosis Date  . Migraine     Patient Active Problem List   Diagnosis Date Noted  . Otitis media with effusion, right 07/10/2016  . Abdominal pain, epigastric 02/27/2016  . Infertility associated with anovulation 12/10/2015  . Irregular menstrual cycle 07/09/2015  . GERD (gastroesophageal reflux disease) 03/07/2015  . Plantar fasciitis, bilateral 05/14/2014  . Major depression 02/23/2013  . Preventive measure 11/16/2012  . Migraines 11/16/2012  . Obesity 11/16/2012  . Allergic rhinitis 11/16/2012    Past Surgical History:  Procedure Laterality Date  . TONSILLECTOMY    . TONSILLECTOMY AND ADENOIDECTOMY      OB History    No data available       Home Medications    Prior to Admission medications   Medication Sig Start Date End Date Taking? Authorizing Provider  atenolol (TENORMIN) 25 MG tablet TAKE 1 TABLET (25 MG TOTAL) BY MOUTH DAILY. 11/24/15   Monica Becton, MD  buPROPion (WELLBUTRIN XL) 300 MG 24 hr tablet Take 1 tablet (300 mg total) by mouth daily. 02/10/16   Monica Becton, MD  cefdinir (OMNICEF) 300 MG capsule Take 1 capsule (300 mg total) by mouth 2 (two) times daily. (Rx void after 11/03/16) 10/26/16   Lattie Haw, MD  EPIPEN 2-PAK 0.3  MG/0.3ML SOAJ injection INJECT AS DIRECTED 02/26/15   Monica Becton, MD  esomeprazole (NEXIUM) 40 MG capsule One tab by mouth BID 02/27/16   Monica Becton, MD  fluticasone [facility]) 50 MCG/ACT nasal spray One spray in each nostril twice a day, use left hand for right nostril, and right hand for left nostril. 05/14/14   Monica Becton, MD  lansoprazole (PREVACID) 30 MG capsule TAKE 1 CAPSULE (30 MG TOTAL) BY MOUTH DAILY AT 6 PM. 07/08/16   Monica Becton, MD  naltrexone (DEPADE) 50 MG tablet One quarter tab twice a day 12/12/15   Monica Becton, MD  predniSONE (DELTASONE) 20 MG tablet Take one tab by mouth twice daily for 4 days, then one daily for 3 days. Take with food. 10/26/16   Lattie Haw, MD  ranitidine (ZANTAC) 300 MG tablet Take 1 tablet (300 mg total) by mouth 2 (two) times daily. 04/15/16   Monica Becton, MD  triamcinolone (NASACORT AQ) 55 MCG/ACT AERO nasal inhaler Place 2 sprays into the nose daily. 07/10/16   Charley Elizabeth Cummings, PA-C  TROKENDI XR 200 MG CP24 TAKE 1 TABLET BY MOUTH DAILY. 05/08/16   Monica Becton, MD  venlafaxine XR (EFFEXOR-XR) 150 MG 24 hr capsule TAKE ONE CAPSULE EVERY DAY 02/10/16   Monica Becton, MD    Family History Family History  Problem Relation Age of Onset  .  Cancer Maternal Aunt   . Hypertension Mother   . Hyperlipidemia Father   . Hyperlipidemia Maternal Grandmother   . Hyperlipidemia Maternal Grandfather   . Hypertension Maternal Grandfather   . Hyperlipidemia Paternal Grandmother   . Hyperlipidemia Paternal Grandfather     Social History Social History  Substance Use Topics  . Smoking status: Never Smoker  . Smokeless tobacco: Never Used  . Alcohol use No     Allergies   Aspartame and phenylalanine; Clarithromycin; and Penicillins   Review of Systems Review of Systems  + sore throat + cough No pleuritic pain No wheezing + nasal congestion + post-nasal drainage No  sinus pain/pressure No itchy/red eyes No earache No hemoptysis No SOB No fever, + chills/sweats + nausea No vomiting No abdominal pain + diarrhea No urinary symptoms No skin rash + fatigue + myalgias No headache Used OTC meds without relief    Physical Exam Triage Vital Signs ED Triage Vitals  Enc Vitals Group     BP 10/26/16 1500 131/83     Pulse Rate 10/26/16 1500 87     Resp 10/26/16 1500 18     Temp 10/26/16 1500 98.9 F (37.2 C)     Temp Source 10/26/16 1500 Oral     SpO2 10/26/16 1500 97 %     Weight 10/26/16 1501 260 lb (117.9 kg)     Height 10/26/16 1501 5\' 2"  (1.575 m)     Head Circumference --      Peak Flow --      Pain Score 10/26/16 1501 0     Pain Loc --      Pain Edu? --      Excl. in GC? --    No data found.   Updated Vital Signs BP 131/83 (BP Location: Left Arm)   Pulse 87   Temp 98.9 F (37.2 C) (Oral)   Resp 18   Ht 5\' 2"  (1.575 m)   Wt 260 lb (117.9 kg)   LMP 10/18/2016   SpO2 97%   BMI 47.55 kg/m   Visual Acuity Right Eye Distance:   Left Eye Distance:   Bilateral Distance:    Right Eye Near:   Left Eye Near:    Bilateral Near:     Physical Exam Nursing notes and Vital Signs reviewed. Appearance:  Patient appears stated age, and in no acute distress Eyes:  Pupils are equal, round, and reactive to light and accomodation.  Extraocular movement is intact.  Conjunctivae are not inflamed  Ears:  Canals normal.  Tympanic membranes normal.  Nose:  Mildly congested turbinates.  No sinus tenderness.  Pharynx:  Normal Neck:  Supple.  Tender enlarged posterior/lateral nodes are palpated bilaterally  Lungs:  Clear to auscultation.  Breath sounds are equal.  Moving air well. Heart:  Regular rate and rhythm without murmurs, rubs, or gallops.  Abdomen:  Mild tenderness to palpation sub-xiphoid area and right upper quadrant without masses or hepatosplenomegaly.  Bowel sounds are present.  No CVA or flank tenderness.  Extremities:  No  edema.  Skin:  No rash present.    UC Treatments / Results  Labs (all labs ordered are listed, but only abnormal results are displayed) Labs Reviewed  POCT CBC W AUTO DIFF ([facility]):  WBC 7.8; LY 42.1; MO 5.5; GR 52.4; Hgb 14.7; Platelets 422     EKG  EKG Interpretation None       Radiology No results found.  Procedures Procedures (including critical care time)  Medications Ordered in UC Medications - No data to display   Initial Impression / Assessment and Plan / UC Course  I have reviewed the triage vital signs and the nursing notes.  Pertinent labs & imaging results that were available during my care of the patient were reviewed by me and considered in my medical decision making (see chart for details).    There is no evidence of bacterial infection today.   Begin prednisone burst/taper. Begin clear liquids (Pedialyte while having diarrhea) until improved, then advance to a [employer] (Bananas, Rice, Applesauce, Toast).  Then gradually resume a regular diet when tolerated.  Avoid milk products until well.  May continue Zofran as needed for nausea.  Take plain guaifenesin (1200mg  extended release tabs such as Mucinex) twice daily, with plenty of water, for cough and congestion.  May add Pseudoephedrine (30mg , one or two every 4 to 6 hours) for sinus congestion.  Get adequate rest.   May use Afrin nasal spray (or generic oxymetazoline) each morning for about 5 days and then discontinue.  Also recommend using saline nasal spray several times daily and saline nasal irrigation (AYR is a common brand).  Use Flonase nasal spray each morning after using Afrin nasal spray and saline nasal irrigation. Try warm salt water gargles for sore throat.  Stop all antihistamines for now, and other non-prescription cough/cold preparations. May take Delsym Cough Suppressant at bedtime for nighttime cough.  Begin Omnicef if not improving about one week or if persistent fever  develops (Given a prescription to hold, with an expiration date)  Follow-up with family doctor if not improving about10 days.     Final Clinical Impressions(s) / UC Diagnoses   Final diagnoses:  Viral URI with cough  Nausea    New Prescriptions New Prescriptions   CEFDINIR (OMNICEF) 300 MG CAPSULE    Take 1 capsule (300 mg total) by mouth 2 (two) times daily. (Rx void after 11/03/16)   PREDNISONE (DELTASONE) 20 MG TABLET    Take one tab by mouth twice daily for 4 days, then one daily for 3 days. Take with food.     Lattie Haw, MD 11/05/16 [phone]

## 2017-02-04 ENCOUNTER — Other Ambulatory Visit: Payer: Self-pay | Admitting: Sports Medicine

## 2017-02-04 DIAGNOSIS — F320 Major depressive disorder, single episode, mild: Secondary | ICD-10-CM

## 2017-05-18 ENCOUNTER — Encounter: Payer: Self-pay | Admitting: Osteopathic Medicine

## 2017-05-18 ENCOUNTER — Ambulatory Visit (INDEPENDENT_AMBULATORY_CARE_PROVIDER_SITE_OTHER): Payer: BLUE CROSS/BLUE SHIELD | Admitting: Osteopathic Medicine

## 2017-05-18 VITALS — BP 112/75 | HR 102 | Temp 98.2°F | Wt 299.0 lb

## 2017-05-18 DIAGNOSIS — J329 Chronic sinusitis, unspecified: Secondary | ICD-10-CM | POA: Diagnosis not present

## 2017-05-18 DIAGNOSIS — R05 Cough: Secondary | ICD-10-CM

## 2017-05-18 DIAGNOSIS — B9789 Other viral agents as the cause of diseases classified elsewhere: Secondary | ICD-10-CM

## 2017-05-18 DIAGNOSIS — R059 Cough, unspecified: Secondary | ICD-10-CM

## 2017-05-18 MED ORDER — CEFDINIR 300 MG PO CAPS: 300.0000 mg | ORAL_CAPSULE | Freq: Two times a day (BID) | ORAL | 0 refills | Status: DC

## 2017-05-18 MED ORDER — IPRATROPIUM BROMIDE 0.06 % NA SOLN: 2.0000 | Freq: Four times a day (QID) | NASAL | 1 refills | Status: DC

## 2017-05-18 NOTE — Patient Instructions (Addendum)
Over-the-Counter Medications & Home Remedies for Upper Respiratory Illness  Note: the following list assumes no pregnancy, normal liver & kidney function and no other drug interactions. Dr. Lyn Hollingshead has highlighted medications which are safe for you to use, but these may not be appropriate for everyone. Always ask a pharmacist or qualified medical provider if you have any questions! If pregnant, consult pharmacist prior to taking any OTC medications.   Aches/Pains, Fever, Headache Acetaminophen (Tylenol) 500 mg tablets - take max 2 tablets (1000 mg) every 6 hours (4 times per day)  Ibuprofen (Motrin) 200 mg tablets - take max 4 tablets (800 mg) every 6 hours*  Sinus Congestion Prescription Atrovent as directed Cromolyn Nasal Spray (NasalCrom) 1 spray each nostril 3-4 times per day, max 6 imes per day Nasal Saline if desired Oxymetolazone (Afrin, others) sparing use due to rebound congestion, NEVER use in kids Phenylephrine (Sudafed) 10 mg tablets every 4 hours (or the 12-hour formulation)* Diphenhydramine (Benadryl) 25 mg tablets - take max 2 tablets every 4 hours  Cough & Sore Throat Prescription cough pills or syrups as directed Dextromethorphan (Robitussin, others) - cough suppressant Guaifenesin (Robitussin, Mucinex, others) - expectorant (helps cough up mucus) (Dextromethorphan and Guaifenesin also come in a combination tablet) Lozenges w/ Benzocaine + Menthol (Cepacol) Honey - as much as you want! Teas which "coat the throat" - look for ingredients Elm Bark, Licorice Root, Marshmallow Root  Other Antibiotics if these are prescribed - take ALL, even if you're feeling better  Zinc Lozenges within 24 hours of symptoms onset - mixed evidence this shortens the duration of the common cold Don't waste your money on Vitamin C or Echinacea  *Caution in patients with high blood pressure

## 2017-05-18 NOTE — Progress Notes (Signed)
HPI: Kendra Nolan is a 26 y.o. female  who presents to [facility] today, 05/18/17,  for chief complaint of:  Chief Complaint  Patient presents with  . Sinus Problem    Sinus congestion 4-5 days developing into dry cough, no fever/chills. Tried OTC Robitussin.    Past medical, surgical, social and family history reviewed: Patient Active Problem List   Diagnosis Date Noted  . Otitis media with effusion, right 07/10/2016  . Abdominal pain, epigastric 02/27/2016  . Infertility associated with anovulation 12/10/2015  . Irregular menstrual cycle 07/09/2015  . GERD (gastroesophageal reflux disease) 03/07/2015  . Plantar fasciitis, bilateral 05/14/2014  . Major depression 02/23/2013  . Preventive measure 11/16/2012  . Migraines 11/16/2012  . Obesity 11/16/2012  . Allergic rhinitis 11/16/2012   Past Surgical History:  Procedure Laterality Date  . TONSILLECTOMY    . TONSILLECTOMY AND ADENOIDECTOMY     Social History  Substance Use Topics  . Smoking status: Never Smoker  . Smokeless tobacco: Never Used  . Alcohol use No   Family History  Problem Relation Age of Onset  . Cancer Maternal Aunt   . Hypertension Mother   . Hyperlipidemia Father   . Hyperlipidemia Maternal Grandmother   . Hyperlipidemia Maternal Grandfather   . Hypertension Maternal Grandfather   . Hyperlipidemia Paternal Grandmother   . Hyperlipidemia Paternal Grandfather      Current medication list and allergy/intolerance information reviewed:   Current Outpatient Prescriptions  Medication Sig Dispense Refill  . EPIPEN 2-PAK 0.3 MG/0.3ML SOAJ injection INJECT AS DIRECTED 2 Device 0  . esomeprazole (NEXIUM) 40 MG capsule One tab by mouth BID 60 capsule 3  . venlafaxine XR (EFFEXOR-XR) 150 MG 24 hr capsule TAKE ONE CAPSULE EVERY DAY 90 capsule 3  . metFORMIN (GLUCOPHAGE) 500 MG tablet Take 500 mg by mouth 2 (two) times daily.  2   No current facility-administered  medications for this visit.    Allergies  Allergen Reactions  . Aspartame And Phenylalanine   . Clarithromycin Hives  . Penicillins Hives      Review of Systems:  Constitutional:  No  fever, no chills, +recent illness, No unintentional weight changes. No significant fatigue.   HEENT: +sinus headache, no vision change, no hearing change, +sore throat, No  sinus pressure  Cardiac: No  chest pain, No  pressure  Respiratory:  No  shortness of breath. +Cough  Gastrointestinal: No  abdominal pain, No  nausea, No  vomiting,  No  blood in stool, No  diarrhea   Exam:  BP 112/75   Pulse (!) 102   Temp 98.2 F (36.8 C) (Oral)   Wt 299 lb (135.6 kg)   BMI 54.69 kg/m   Constitutional: VS see above. General Appearance: alert, well-developed, well-nourished, NAD  Eyes: Normal lids and conjunctive, non-icteric sclera  Ears, Nose, Mouth, Throat: MMM, Normal external inspection ears/nares/mouth/lips/gums. TM normal bilaterally. Pharynx +erythema, tonsils surgically absent , no exudate. Nasal mucosa normal.   Neck: No masses, trachea midline. No thyroid enlargement. No tenderness/mass appreciated. No lymphadenopathy  Respiratory: Normal respiratory effort. no wheeze, no rhonchi, no rales  Cardiovascular: S1/S2 normal, no murmur, no rub/gallop auscultated. RRR. No lower extremity edema.   Musculoskeletal: Gait normal.   Skin: warm, dry, intact.     No results found for this or any previous visit (from the past 72 hour(s)).  No results found.   ASSESSMENT/PLAN: On patient instructions, portion highlighted for honey, Teas which coat the throat. Advised  would avoid Sudafed though the greatest danger of this medication appears to be first trimester use. Advised discuss any and all over-the-counter medications with pharmacist before taking these, or refer to list of safe medicines given to her by OB/GYN. At this point, try decreasing sinus inflammation with Atrovent, prescription  printed for abx take if symptoms persist 7-10 days or longer.  Viral sinusitis  Cough       Visit summary with medication list and pertinent instructions was printed for patient to review. All questions at time of visit were answered - patient instructed to contact office with any additional concerns. ER/RTC precautions were reviewed with the patient. Follow-up plan: Return if symptoms worsen or fail to improve.  Note: Total time spent 15 minutes, greater than 50% of the visit was spent face-to-face counseling and coordinating care for the following: The primary encounter diagnosis was Viral sinusitis. A diagnosis of Cough was also pertinent to this visit.Marland Kitchen

## 2017-07-05 LAB — OB RESULTS CONSOLE GBS: GBS: NEGATIVE

## 2017-07-26 ENCOUNTER — Encounter (HOSPITAL_COMMUNITY): Payer: Self-pay | Admitting: *Deleted

## 2017-07-26 ENCOUNTER — Inpatient Hospital Stay (HOSPITAL_COMMUNITY)
Admission: AD | Admit: 2017-07-26 | Discharge: 2017-07-26 | Disposition: A | Discharge status: Home / Self Care | Payer: BLUE CROSS/BLUE SHIELD | Source: Ambulatory Visit | Attending: Obstetrics & Gynecology | Admitting: Obstetrics & Gynecology

## 2017-07-26 ENCOUNTER — Other Ambulatory Visit: Payer: Self-pay

## 2017-07-26 DIAGNOSIS — O471 False labor at or after 37 completed weeks of gestation: Secondary | ICD-10-CM

## 2017-07-26 LAB — POCT FERN TEST: POCT Fern Test: NEGATIVE

## 2017-07-26 NOTE — MAU Provider Note (Signed)
S: Ms. Kendra Nolan is a 26 y.o. G2P0010 at 6136w0d  who presents to MAU today complaining of leaking of fluid since 0900. She denies vaginal bleeding. She endorses contractions. She reports normal fetal movement.    O: BP 135/86 (BP Location: Right Arm)   Pulse 98   Temp 98.3 F (36.8 C) (Oral)   Resp 20   Ht 5\' 3"  (1.6 m)   Wt (!) 324 lb 12 oz (147.3 kg)   LMP 10/18/2016   SpO2 100%   BMI 57.53 kg/m  GENERAL: Well-developed, well-nourished female in no acute distress.  HEAD: Normocephalic, atraumatic.  CHEST: Normal effort of breathing, regular heart rate ABDOMEN: Soft, nontender, gravid PELVIC: Normal external female genitalia. Vagina is pink and rugated. Cervix with normal contour, no lesions. Normal discharge.  Negative pooling.   Cervical exam:  Dilation: 1 Cervical Position: Posterior Station: -3 Presentation: Vertex Exam by:: Doreatha MassedF. Morris, RNC  Fetal Monitoring: Baseline: 140 Variability: moderate Accelerations: present  Decelerations: none Contractions: 2 UC over 1 hour   Results for orders placed or performed during the hospital encounter of 07/26/17 (from the past 24 hour(s))  POCT fern test     Status: None   Collection Time: 07/26/17  2:59 PM  Result Value Ref Range   POCT Fern Test Negative = intact amniotic membranes      A: SIUP at 5636w0d  Membranes intact  P: Report given to RN to contact MD on call for further instructions  Sharyon CableRogers, [place] Hakeem C, CNM 07/26/2017 3:08 PM

## 2017-07-26 NOTE — MAU Provider Note (Signed)
Pt here with contractions per RN. Exam c/w early labor. No ROM.  NST read- reactive -145/150 + accels,  No decels, mod variability, reactive Toco rate Discharge home.

## 2017-07-26 NOTE — Discharge Instructions (Signed)
Braxton Hicks Contractions °Contractions of the uterus can occur throughout pregnancy, but they are not always a sign that you are in labor. You may have practice contractions called Braxton Hicks contractions. These false labor contractions are sometimes confused with true labor. °What are Braxton Hicks contractions? °Braxton Hicks contractions are tightening movements that occur in the muscles of the uterus before labor. Unlike true labor contractions, these contractions do not result in opening (dilation) and thinning of the cervix. Toward the end of pregnancy (32-34 weeks), Braxton Hicks contractions can happen more often and may become stronger. These contractions are sometimes difficult to tell apart from true labor because they can be very uncomfortable. You should not feel embarrassed if you go to the hospital with false labor. °Sometimes, the only way to tell if you are in true labor is for your health care provider to look for changes in the cervix. The health care provider will do a physical exam and may monitor your contractions. If you are not in true labor, the exam should show that your cervix is not dilating and your water has not broken. °If there are other health problems associated with your pregnancy, it is completely safe for you to be sent home with false labor. You may continue to have Braxton Hicks contractions until you go into true labor. °How to tell the difference between true labor and false labor °True labor °· Contractions last 30-70 seconds. °· Contractions become very regular. °· Discomfort is usually felt in the top of the uterus, and it spreads to the lower abdomen and low back. °· Contractions do not go away with walking. °· Contractions usually become more intense and increase in frequency. °· The cervix dilates and gets thinner. °False labor °· Contractions are usually shorter and not as strong as true labor contractions. °· Contractions are usually irregular. °· Contractions  are often felt in the front of the lower abdomen and in the groin. °· Contractions may go away when you walk around or change positions while lying down. °· Contractions get weaker and are shorter-lasting as time goes on. °· The cervix usually does not dilate or become thin. °Follow these instructions at home: °· Take over-the-counter and prescription medicines only as told by your health care provider. °· Keep up with your usual exercises and follow other instructions from your health care provider. °· Eat and drink lightly if you think you are going into labor. °· If Braxton Hicks contractions are making you uncomfortable: °? Change your position from lying down or resting to walking, or change from walking to resting. °? Sit and rest in a tub of warm water. °? Drink enough fluid to keep your urine pale yellow. Dehydration may cause these contractions. °? Do slow and deep breathing several times an hour. °· Keep all follow-up prenatal visits as told by your health care provider. This is important. °Contact a health care provider if: °· You have a fever. °· You have continuous pain in your abdomen. °Get help right away if: °· Your contractions become stronger, more regular, and closer together. °· You have fluid leaking or gushing from your vagina. °· You pass blood-tinged mucus (bloody show). °· You have bleeding from your vagina. °· You have low back pain that you never had before. °· You feel your baby’s head pushing down and causing pelvic pressure. °· Your baby is not moving inside you as much as it used to. °Summary °· Contractions that occur before labor are called Braxton   Hicks contractions, false labor, or practice contractions. °· Braxton Hicks contractions are usually shorter, weaker, farther apart, and less regular than true labor contractions. True labor contractions usually become progressively stronger and regular and they become more frequent. °· Manage discomfort from Braxton Hicks contractions by  changing position, resting in a warm bath, drinking plenty of water, or practicing deep breathing. °This information is not intended to replace advice given to you by your health care provider. Make sure you discuss any questions you have with your health care provider. °Document Released: 12/03/2016 Document Revised: 12/03/2016 Document Reviewed: 12/03/2016 °Elsevier Interactive Patient Education © 2018 [employer] ° °Fetal Movement Counts °Patient Name: ________________________________________________ Patient Due Date: ____________________ °What is a fetal movement count? °A fetal movement count is the number of times that you feel your baby move during a certain amount of time. This may also be called a fetal kick count. A fetal movement count is recommended for every pregnant woman. You may be asked to start counting fetal movements as early as week 28 of your pregnancy. °Pay attention to when your baby is most active. You may notice your baby's sleep and wake cycles. You may also notice things that make your baby move more. You should do a fetal movement count: °· When your baby is normally most active. °· At the same time each day. ° °A good time to count movements is while you are resting, after having something to eat and drink. °How do I count fetal movements? °1. Find a quiet, comfortable area. Sit, or lie down on your side. °2. Write down the date, the start time and stop time, and the number of movements that you felt between those two times. Take this information with you to your health care visits. °3. For 2 hours, count kicks, flutters, swishes, rolls, and jabs. You should feel at least 10 movements during 2 hours. °4. You may stop counting after you have felt 10 movements. °5. If you do not feel 10 movements in 2 hours, have something to eat and drink. Then, keep resting and counting for 1 hour. If you feel at least 4 movements during that hour, you may stop counting. °Contact a health care  provider if: °· You feel fewer than 4 movements in 2 hours. °· Your baby is not moving like he or she usually does. °Date: ____________ Start time: ____________ Stop time: ____________ Movements: ____________ °Date: ____________ Start time: ____________ Stop time: ____________ Movements: ____________ °Date: ____________ Start time: ____________ Stop time: ____________ Movements: ____________ °Date: ____________ Start time: ____________ Stop time: ____________ Movements: ____________ °Date: ____________ Start time: ____________ Stop time: ____________ Movements: ____________ °Date: ____________ Start time: ____________ Stop time: ____________ Movements: ____________ °Date: ____________ Start time: ____________ Stop time: ____________ Movements: ____________ °Date: ____________ Start time: ____________ Stop time: ____________ Movements: ____________ °Date: ____________ Start time: ____________ Stop time: ____________ Movements: ____________ °This information is not intended to replace advice given to you by your health care provider. Make sure you discuss any questions you have with your health care provider. °Document Released: 08/19/2006 Document Revised: 03/18/2016 Document Reviewed: 08/29/2015 °Elsevier Interactive Patient Education © 2018 Elsevier Inc. ° °

## 2017-07-26 NOTE — Progress Notes (Signed)
I have communicated with Dr. Juliene PinaMody and reviewed vital signs:  Vitals:   07/26/17 1408 07/26/17 1513  BP: 135/86 (!) 133/92  Pulse: 98 91  Resp: 20   Temp: 98.3 F (36.8 C) 97.6 F (36.4 C)  SpO2: 100% 99%    Vaginal exam:  Dilation: 1 Cervical Position: Posterior Station: -3 Presentation: Vertex Exam by:: F. Ervin Rothbauer, RNC,   Also reviewed contraction pattern and that non-stress test is reactive.  It has been documented that patient is contracting irregularly with cervix exam of 1cm indicating no active labor.  Patient denies any other complaints.  Based on this report provider has given order for discharge.  A discharge order and diagnosis entered by a provider.   Labor discharge instructions reviewed with patient.

## 2017-07-26 NOTE — MAU Note (Signed)
Pt presents with c/o ctxs since last night.  Reports possible ROM.  Denies VB or bloody show.  Reports +FM.

## 2017-07-27 ENCOUNTER — Encounter (HOSPITAL_COMMUNITY): Payer: Self-pay | Admitting: Certified Registered Nurse Anesthetist

## 2017-07-27 ENCOUNTER — Inpatient Hospital Stay (HOSPITAL_COMMUNITY): Payer: BLUE CROSS/BLUE SHIELD | Admitting: Anesthesiology

## 2017-07-27 ENCOUNTER — Encounter (HOSPITAL_COMMUNITY): Payer: Self-pay

## 2017-07-27 ENCOUNTER — Inpatient Hospital Stay (HOSPITAL_COMMUNITY)
Admission: AD | Admit: 2017-07-27 | Discharge: 2017-07-28 | DRG: 807 | Disposition: A | Discharge status: Home / Self Care | Payer: BLUE CROSS/BLUE SHIELD | Source: Ambulatory Visit | Attending: Obstetrics and Gynecology | Admitting: Obstetrics and Gynecology

## 2017-07-27 DIAGNOSIS — Z3483 Encounter for supervision of other normal pregnancy, third trimester: Secondary | ICD-10-CM | POA: Diagnosis present

## 2017-07-27 DIAGNOSIS — F419 Anxiety disorder, unspecified: Secondary | ICD-10-CM | POA: Diagnosis present

## 2017-07-27 DIAGNOSIS — Z3490 Encounter for supervision of normal pregnancy, unspecified, unspecified trimester: Secondary | ICD-10-CM

## 2017-07-27 DIAGNOSIS — O43123 Velamentous insertion of umbilical cord, third trimester: Principal | ICD-10-CM | POA: Diagnosis present

## 2017-07-27 DIAGNOSIS — F329 Major depressive disorder, single episode, unspecified: Secondary | ICD-10-CM | POA: Diagnosis present

## 2017-07-27 DIAGNOSIS — Z3A39 39 weeks gestation of pregnancy: Secondary | ICD-10-CM | POA: Diagnosis not present

## 2017-07-27 DIAGNOSIS — O99344 Other mental disorders complicating childbirth: Secondary | ICD-10-CM | POA: Diagnosis present

## 2017-07-27 DIAGNOSIS — R1013 Epigastric pain: Secondary | ICD-10-CM

## 2017-07-27 DIAGNOSIS — O99214 Obesity complicating childbirth: Secondary | ICD-10-CM | POA: Diagnosis present

## 2017-07-27 LAB — CBC
HEMATOCRIT: 41.4 % (ref 36.0–46.0)
Hemoglobin: 14.3 g/dL (ref 12.0–15.0)
MCH: 31.7 pg (ref 26.0–34.0)
MCHC: 34.5 g/dL (ref 30.0–36.0)
MCV: 91.8 fL (ref 78.0–100.0)
Platelets: 398 10*3/uL (ref 150–400)
RBC: 4.51 MIL/uL (ref 3.87–5.11)
RDW: 13.7 % (ref 11.5–15.5)
WBC: 18.2 10*3/uL — ABNORMAL HIGH (ref 4.0–10.5)

## 2017-07-27 LAB — ABO/RH: ABO/RH(D): A POS

## 2017-07-27 LAB — TYPE AND SCREEN
ABO/RH(D): A POS
ANTIBODY SCREEN: NEGATIVE

## 2017-07-27 MED ORDER — FLEET ENEMA 7-19 GM/118ML RE ENEM: 1.0000 | ENEMA | RECTAL | Status: DC | PRN

## 2017-07-27 MED ORDER — LACTATED RINGERS IV SOLN: INTRAVENOUS | Status: DC

## 2017-07-27 MED ORDER — OXYTOCIN BOLUS FROM INFUSION
500.0000 mL | Freq: Once | INTRAVENOUS | Status: AC
  Administered 2017-07-27: 500 mL via INTRAVENOUS

## 2017-07-27 MED ORDER — ONDANSETRON HCL 4 MG/2ML IJ SOLN: 4.0000 mg | Freq: Four times a day (QID) | INTRAMUSCULAR | Status: DC | PRN

## 2017-07-27 MED ORDER — OXYCODONE-ACETAMINOPHEN 5-325 MG PO TABS: 1.0000 | ORAL_TABLET | ORAL | Status: DC | PRN

## 2017-07-27 MED ORDER — OXYCODONE-ACETAMINOPHEN 5-325 MG PO TABS: 2.0000 | ORAL_TABLET | ORAL | Status: DC | PRN

## 2017-07-27 MED ORDER — PRENATAL MULTIVITAMIN CH
1.0000 | ORAL_TABLET | Freq: Every day | ORAL | Status: DC
  Administered 2017-07-28: 1 via ORAL
  Filled 2017-07-27: qty 1

## 2017-07-27 MED ORDER — METHYLERGONOVINE MALEATE 0.2 MG PO TABS: 0.2000 mg | ORAL_TABLET | ORAL | Status: DC | PRN

## 2017-07-27 MED ORDER — SOD CITRATE-CITRIC ACID 500-334 MG/5ML PO SOLN: 30.0000 mL | ORAL | Status: DC | PRN

## 2017-07-27 MED ORDER — PROMETHAZINE HCL 25 MG/ML IJ SOLN
12.5000 mg | Freq: Once | INTRAMUSCULAR | Status: AC
  Administered 2017-07-27: 12.5 mg via INTRAMUSCULAR
  Filled 2017-07-27: qty 1

## 2017-07-27 MED ORDER — METHYLERGONOVINE MALEATE 0.2 MG/ML IJ SOLN: 0.2000 mg | INTRAMUSCULAR | Status: DC | PRN

## 2017-07-27 MED ORDER — BENZOCAINE-MENTHOL 20-0.5 % EX AERO
1.0000 "application " | INHALATION_SPRAY | CUTANEOUS | Status: DC | PRN
  Administered 2017-07-27: 1 via TOPICAL
  Filled 2017-07-27: qty 56

## 2017-07-27 MED ORDER — LACTATED RINGERS IV SOLN: 500.0000 mL | Freq: Once | INTRAVENOUS | Status: DC

## 2017-07-27 MED ORDER — BUTORPHANOL TARTRATE 1 MG/ML IJ SOLN: 1.0000 mg | Freq: Once | INTRAMUSCULAR | Status: DC

## 2017-07-27 MED ORDER — SENNOSIDES-DOCUSATE SODIUM 8.6-50 MG PO TABS
2.0000 | ORAL_TABLET | ORAL | Status: DC
  Administered 2017-07-28: 2 via ORAL
  Filled 2017-07-27: qty 2

## 2017-07-27 MED ORDER — FENTANYL 2.5 MCG/ML BUPIVACAINE 1/10 % EPIDURAL INFUSION (WH - ANES)
14.0000 mL/h | INTRAMUSCULAR | Status: DC | PRN
  Administered 2017-07-27: 14 mL/h via EPIDURAL
  Filled 2017-07-27: qty 100

## 2017-07-27 MED ORDER — PHENYLEPHRINE 40 MCG/ML (10ML) SYRINGE FOR IV PUSH (FOR BLOOD PRESSURE SUPPORT)
80.0000 ug | PREFILLED_SYRINGE | INTRAVENOUS | Status: DC | PRN
  Filled 2017-07-27: qty 10
  Filled 2017-07-27: qty 5

## 2017-07-27 MED ORDER — EPHEDRINE 5 MG/ML INJ
10.0000 mg | INTRAVENOUS | Status: DC | PRN
  Filled 2017-07-27: qty 2

## 2017-07-27 MED ORDER — LIDOCAINE HCL (PF) 1 % IJ SOLN
30.0000 mL | INTRAMUSCULAR | Status: DC | PRN
  Administered 2017-07-27: 30 mL via SUBCUTANEOUS
  Filled 2017-07-27: qty 30

## 2017-07-27 MED ORDER — DIPHENHYDRAMINE HCL 25 MG PO CAPS: 25.0000 mg | ORAL_CAPSULE | Freq: Four times a day (QID) | ORAL | Status: DC | PRN

## 2017-07-27 MED ORDER — TETANUS-DIPHTH-ACELL PERTUSSIS 5-2.5-18.5 LF-MCG/0.5 IM SUSP: 0.5000 mL | Freq: Once | INTRAMUSCULAR | Status: DC

## 2017-07-27 MED ORDER — ONDANSETRON HCL 4 MG PO TABS: 4.0000 mg | ORAL_TABLET | ORAL | Status: DC | PRN

## 2017-07-27 MED ORDER — DIBUCAINE 1 % RE OINT: 1.0000 "application " | TOPICAL_OINTMENT | RECTAL | Status: DC | PRN

## 2017-07-27 MED ORDER — BUTORPHANOL TARTRATE 1 MG/ML IJ SOLN
1.0000 mg | Freq: Once | INTRAMUSCULAR | Status: AC
  Administered 2017-07-27: 1 mg via INTRAMUSCULAR
  Filled 2017-07-27: qty 1

## 2017-07-27 MED ORDER — ZOLPIDEM TARTRATE 5 MG PO TABS: 5.0000 mg | ORAL_TABLET | Freq: Every evening | ORAL | Status: DC | PRN

## 2017-07-27 MED ORDER — SIMETHICONE 80 MG PO CHEW: 80.0000 mg | CHEWABLE_TABLET | ORAL | Status: DC | PRN

## 2017-07-27 MED ORDER — COCONUT OIL OIL: 1.0000 "application " | TOPICAL_OIL | Status: DC | PRN

## 2017-07-27 MED ORDER — DIPHENHYDRAMINE HCL 50 MG/ML IJ SOLN: 12.5000 mg | INTRAMUSCULAR | Status: DC | PRN

## 2017-07-27 MED ORDER — PHENYLEPHRINE 40 MCG/ML (10ML) SYRINGE FOR IV PUSH (FOR BLOOD PRESSURE SUPPORT)
80.0000 ug | PREFILLED_SYRINGE | INTRAVENOUS | Status: DC | PRN
  Filled 2017-07-27: qty 5

## 2017-07-27 MED ORDER — LIDOCAINE HCL (PF) 1 % IJ SOLN
INTRAMUSCULAR | Status: DC | PRN
  Administered 2017-07-27: 6 mL via EPIDURAL
  Administered 2017-07-27: 7 mL via EPIDURAL

## 2017-07-27 MED ORDER — BUTORPHANOL TARTRATE 1 MG/ML IJ SOLN
1.0000 mg | Freq: Once | INTRAMUSCULAR | Status: AC
  Administered 2017-07-27: 1 mg via INTRAVENOUS
  Filled 2017-07-27: qty 1

## 2017-07-27 MED ORDER — ONDANSETRON HCL 4 MG/2ML IJ SOLN: 4.0000 mg | INTRAMUSCULAR | Status: DC | PRN

## 2017-07-27 MED ORDER — ACETAMINOPHEN 325 MG PO TABS: 650.0000 mg | ORAL_TABLET | ORAL | Status: DC | PRN

## 2017-07-27 MED ORDER — LACTATED RINGERS IV SOLN: 500.0000 mL | INTRAVENOUS | Status: DC | PRN

## 2017-07-27 MED ORDER — IBUPROFEN 600 MG PO TABS
600.0000 mg | ORAL_TABLET | Freq: Four times a day (QID) | ORAL | Status: DC
  Administered 2017-07-27 – 2017-07-28 (×4): 600 mg via ORAL
  Filled 2017-07-27 (×4): qty 1

## 2017-07-27 MED ORDER — OXYTOCIN 40 UNITS IN LACTATED RINGERS INFUSION - SIMPLE MED
2.5000 [IU]/h | INTRAVENOUS | Status: DC
  Filled 2017-07-27: qty 1000

## 2017-07-27 MED ORDER — WITCH HAZEL-GLYCERIN EX PADS: 1.0000 "application " | MEDICATED_PAD | CUTANEOUS | Status: DC | PRN

## 2017-07-27 NOTE — MAU Note (Signed)
Pt states she began contracting around midnight; lost her mucous plug. Now cntx 2-3 mins apart. Denies LOF; +FM

## 2017-07-27 NOTE — Anesthesia Postprocedure Evaluation (Signed)
Anesthesia Post Note  Patient: Kendra Nolan  Procedure(s) Performed: AN AD HOC LABOR EPIDURAL     Patient location during evaluation: Mother Baby Anesthesia Type: Epidural Level of consciousness: awake and alert and oriented Pain management: pain level controlled Vital Signs Assessment: post-procedure vital signs reviewed and stable Respiratory status: spontaneous breathing and nonlabored ventilation Cardiovascular status: stable Postop Assessment: no headache, patient able to bend at knees, no backache, no apparent nausea or vomiting, epidural receding and adequate PO intake Anesthetic complications: no    Last Vitals:  Vitals:   07/27/17 1423 07/27/17 1453  BP: 121/66 137/75  Pulse: (!) 127 89  Resp: 18 18  Temp:  37.3 C  SpO2:      Last Pain:  Vitals:   07/27/17 1453  TempSrc: Oral  PainSc: 0-No pain   Pain Goal:                 [name],[name]

## 2017-07-27 NOTE — Anesthesia Procedure Notes (Signed)
Epidural Patient location during procedure: OB Start time: 07/27/2017 10:08 AM End time: 07/27/2017 10:16 AM  Staffing Anesthesiologist: Leilani AbleHatchett, Karlisa Gaubert, MD Performed: anesthesiologist   Preanesthetic Checklist Completed: patient identified, surgical consent, pre-op evaluation, timeout performed, IV checked, risks and benefits discussed and monitors and equipment checked  Epidural Patient position: sitting Prep: site prepped and draped and DuraPrep Patient monitoring: continuous pulse ox and blood pressure Approach: midline Location: L3-L4 Injection technique: LOR air  Needle:  Needle type: Tuohy  Needle gauge: 17 G Needle length: 9 cm and 9 Needle insertion depth: 9 cm Catheter type: closed end flexible Catheter size: 19 Gauge Catheter at skin depth: 15 cm Test dose: negative and Other  Assessment Sensory level: T10 Events: blood not aspirated, injection not painful, no injection resistance, negative IV test and paresthesia  Additional Notes L leg X 1Reason for block:procedure for pain

## 2017-07-27 NOTE — Progress Notes (Signed)
MOB was referred for history of depression/anxiety. * Referral screened out by Clinical Social Worker because none of the following criteria appear to apply: ~ History of anxiety/depression during this pregnancy, or of post-partum depression. ~ Diagnosis of anxiety and/or depression within last 3 years OR * MOB's symptoms currently being treated with medication and/or therapy. Please contact the Clinical Social Worker if needs arise, [facility], or if MOB scores greater than 9/yes to question 10 on [place] Postpartum Depression Screen.    CSW notes Rx for Effexor and [place] Postpartum Depression Screen score less than 10.

## 2017-07-27 NOTE — H&P (Signed)
OB ADMISSION/ HISTORY & PHYSICAL:  Admission Date: 07/27/2017  6:17 AM  Admit Diagnosis: 39+1 weeks normal labor  Kendra Nolan is a 26 y.o. female G2P0010 at 39+1 weeks presenting for labor.   Prenatal History: G2P0010   EDC : 08/02/2017, Date entered prior to episode creation  [facility]  Primary Ob Provider: Dr. Billy Coastaavon Prenatal course complicated by  - Marginal cord insertion  - Obesity - PCOS - on Metformin 500mg  BID - Infertility  - Depression/anxiety - on Effexor 150mg  daily - Migraines - UTI in pregnancy   Prenatal Labs: ABO, Rh:  A Positive  Antibody:  Negative Rubella:   Immune RPR:   NR HBsAg:   Negative HIV:   NR GTT: 131 GBS: Negative (12/03 0000)   Medical / Surgical History :  Past medical history:  Past Medical History:  Diagnosis Date  . Migraine      Past surgical history:  Past Surgical History:  Procedure Laterality Date  . TONSILLECTOMY    . TONSILLECTOMY AND ADENOIDECTOMY      Family History:  Family History  Problem Relation Age of Onset  . Cancer Maternal Aunt   . Hypertension Mother   . Hyperlipidemia Father   . Hyperlipidemia Maternal Grandmother   . Hyperlipidemia Maternal Grandfather   . Hypertension Maternal Grandfather   . Hyperlipidemia Paternal Grandmother   . Hyperlipidemia Paternal Grandfather      Social History:  reports that  has never smoked. she has never used smokeless tobacco. She reports that she does not drink alcohol or use drugs.   Allergies: Codeine; Clarithromycin; and Penicillins    Current Medications at time of admission:  Prior to Admission medications   Medication Sig Start Date End Date Taking? Authorizing Provider  cefdinir (OMNICEF) 300 MG capsule Take 1 capsule (300 mg total) by mouth 2 (two) times daily. (Rx void after 11/03/16) Patient not taking: Reported on 07/26/2017 05/18/17   Sunnie NielsenAlexander, Natalie, DO  cetirizine (ZYRTEC) 10 MG tablet Take 10 mg by mouth at  bedtime.    [provider]  EPIPEN 2-PAK 0.3 MG/0.3ML SOAJ injection INJECT AS DIRECTED 02/26/15   Monica Bectonhekkekandam, Thomas J, MD  esomeprazole (NEXIUM) 40 MG capsule One tab by mouth BID Patient taking differently: Take 40 mg by mouth at bedtime. One tab by mouth BID 02/27/16   Monica Bectonhekkekandam, Thomas J, MD  ipratropium (ATROVENT) 0.06 % nasal spray Place 2 sprays into both nostrils 4 (four) times daily. Patient not taking: Reported on 07/26/2017 05/18/17   Sunnie NielsenAlexander, Natalie, DO  metFORMIN (GLUCOPHAGE) 500 MG tablet Take 500 mg by mouth 2 (two) times daily. 05/07/17   [provider]  Prenatal Vit-Fe Fumarate-FA (PRENATAL MULTIVITAMIN) TABS tablet Take 1 tablet by mouth at bedtime.    [provider]  venlafaxine XR (EFFEXOR-XR) 150 MG 24 hr capsule TAKE ONE CAPSULE EVERY DAY Patient taking differently: TAKE ONE CAPSULE AT BEDTIME 02/04/17   Monica Bectonhekkekandam, Thomas J, MD     Review of Systems: Active FM onset of ctx @ midnight currently every 2-3 minutes No LOF  / SROM  No bloody show    Physical Exam:  VS: Temperature 97.9 F (36.6 C), temperature source Oral, resp. rate 20, last menstrual period 10/18/2016.  General: alert and oriented, grimacing with contractions Heart: RRR Lungs: Clear lung fields Abdomen: Gravid, soft and non-tender, non-distended, obese / uterus: gravid, non-tender Extremities: no   Genitalia / VE: Dilation: 4 Effacement (%): 90 Station: -2 Exam by:: Dorrene GermanJ. Lowe  RN  FHR: baseline rate 140 bpm / variability moderate / accelerations +15x15 / no decelerations TOCO: difficult to assess due to habitus  Assessment: 39+[redacted] weeks gestation Latent stage of labor FHR category 1 GBS Negative   Plan:  [facility]   - Routine labor and delivery orders   - Pain management: epidural  2. GBS Negative    - No prophylaxis indicated 3. Postpartum:   - Breast   - Contraception: unsure 4. Anticipate MOD: NSVD   - EFW by sono: AGA    Dr. Billy Coastaavon notified of admission / plan of care  Carlean JewsMeredith Xzaiver Vayda, MSN, [facility]

## 2017-07-27 NOTE — Progress Notes (Signed)
Kendra Nolan is a 26 y.o. G2P0010 at 5311w1d by LMP admitted for active labor  Subjective: Feels pressure  Objective: BP (!) 135/57   Pulse (!) 113   Temp 97.9 F (36.6 C) (Oral)   Resp 20   LMP 10/18/2016   SpO2 99%  No intake/output data recorded. No intake/output data recorded.  FHT:  FHR: 145 bpm, variability: moderate,  accelerations:  Present,  decelerations:  Present occ variable UC:   regular, every 2 minutes SVE:   Dilation: 10 Effacement (%): 100 Station: +1 Exam by:: lee  Labs: Lab Results  Component Value Date   WBC 18.2 (H) 07/27/2017   HGB 14.3 07/27/2017   HCT 41.4 07/27/2017   MCV 91.8 07/27/2017   PLT 398 07/27/2017    Assessment / Plan: Spontaneous labor, progressing normally  Marginal PCI BMI>40  Labor: Progressing normally Preeclampsia:  no signs or symptoms of toxicity Fetal Wellbeing:  Category I Pain Control:  Epidural I/D:  n/a Anticipated MOD:  Guarded, anticipate attempt at VD  Kendra Nolan 07/27/2017, 11:29 AM

## 2017-07-27 NOTE — Anesthesia Preprocedure Evaluation (Signed)
Anesthesia Evaluation  Patient identified by MRN, date of birth, ID band Patient awake    Reviewed: Allergy & Precautions, H&P , NPO status , Patient's Chart, lab work & pertinent test results  Airway Mallampati: III  TM Distance: >3 FB Neck ROM: full    Dental no notable dental hx. (+) Teeth Intact   Pulmonary neg pulmonary ROS,    Pulmonary exam normal breath sounds clear to auscultation       Cardiovascular negative cardio ROS Normal cardiovascular exam Rhythm:regular Rate:Normal     Neuro/Psych    GI/Hepatic Neg liver ROS, GERD  Medicated and Controlled,  Endo/Other  Morbid obesity  Renal/GU negative Renal ROS  negative genitourinary   Musculoskeletal negative musculoskeletal ROS (+)   Abdominal (+) + obese,   Peds  Hematology negative hematology ROS (+)   Anesthesia Other Findings   Reproductive/Obstetrics (+) Pregnancy                             Anesthesia Physical Anesthesia Plan  ASA: III  Anesthesia Plan: Epidural   Post-op Pain Management:    Induction:   PONV Risk Score and Plan:   Airway Management Planned:   Additional Equipment:   Intra-op Plan:   Post-operative Plan:   Informed Consent: I have reviewed the patients History and Physical, chart, labs and discussed the procedure including the risks, benefits and alternatives for the proposed anesthesia with the patient or authorized representative who has indicated his/her understanding and acceptance.     Plan Discussed with:   Anesthesia Plan Comments:         Anesthesia Quick Evaluation

## 2017-07-27 NOTE — Lactation Note (Addendum)
This note was copied from a baby's chart. Lactation Consultation Note  Patient Name: Kendra Nolan ZOXWR'UToday's Date: 07/27/2017 Reason for consult: Initial assessment   Initial assessment with first time mom of 3 hour old infant. Infant has not BF since birth. RN was assisting mom with pillows and latching infant.   Infant latched easily with flanged lips and rhythmic suckles with a few swallows noted. Mom denied pain/tenderness with latch. Nipple was round when infant came off. Infant is sleepy at times during feeding, enc mom to massage/compress breast with feedings and to keep infant stimulated as needed during feeding to maintain active suckling. Mom with soft compressible breasts and everted nipples. Mom reports minimal breast changes with pregnancy. Showed mom how to hand express, no colostrum noted at this time.   Reviewed BF basics, positioning, infant stomach size, hand expression, milk coming to volume, cluster feeding, STS, and pillow and head support. Enc mom to BF infant 8-12 x in 24 hours at first feeding cues. Reviewed normalcy of cluster feeding. Enc mom and dad to keep infant STS as much as possible.   BF Resources Handout and LC Brochure given, mom informed of IP/OP Services, and BF Support Groups. Mom has a [employer]ela Freestyle at home for use.   Mom reports she has no further questions/concerns at this time. Enc mom to call out for feeding assistance as needed.    Maternal Data Formula Feeding for Exclusion: No Has patient been taught Hand Expression?: Yes Does the patient have breastfeeding experience prior to this delivery?: No  Feeding Feeding Type: Breast Fed Length of feed: 15 min(Still BF when LC left room)  LATCH Score Latch: Repeated attempts needed to sustain latch, nipple held in mouth throughout feeding, stimulation needed to elicit sucking reflex.  Audible Swallowing: A few with stimulation  Type of Nipple: Everted at rest and after stimulation  Comfort  (Breast/Nipple): Soft / non-tender  Hold (Positioning): Assistance needed to correctly position infant at breast and maintain latch.  LATCH Score: 7  Interventions Interventions: Breast feeding basics reviewed;Support pillows;Assisted with latch;Position options;Skin to skin;Expressed milk;Breast compression;Breast massage  Lactation Tools Discussed/Used WIC Program: No   Consult Status Consult Status: Follow-up Date: 07/28/17 Follow-up type: In-patient    Silas FloodSharon S Sefora Tietje 07/27/2017, 3:44 PM

## 2017-07-28 ENCOUNTER — Ambulatory Visit: Payer: Self-pay

## 2017-07-28 LAB — CBC
HEMATOCRIT: 34.9 % — AB (ref 36.0–46.0)
Hemoglobin: 11.6 g/dL — ABNORMAL LOW (ref 12.0–15.0)
MCH: 31.4 pg (ref 26.0–34.0)
MCHC: 33.2 g/dL (ref 30.0–36.0)
MCV: 94.3 fL (ref 78.0–100.0)
PLATELETS: 339 10*3/uL (ref 150–400)
RBC: 3.7 MIL/uL — AB (ref 3.87–5.11)
RDW: 14.2 % (ref 11.5–15.5)
WBC: 16.8 10*3/uL — AB (ref 4.0–10.5)

## 2017-07-28 MED ORDER — IBUPROFEN 600 MG PO TABS: 600.0000 mg | ORAL_TABLET | Freq: Four times a day (QID) | ORAL | 0 refills | Status: DC

## 2017-07-28 NOTE — Progress Notes (Signed)
PPD 1 SVD with 2nd degree repair  S:  Reports feeling well - wants to go home this pm             Tolerating po/ No nausea or vomiting             Bleeding is light             Pain controlled with motrin             Up ad lib / ambulatory / voiding QS  Newborn breast feeding  O:  VS: BP 133/73   Pulse (!) 103   Temp 98.1 F (36.7 C) (Oral)   Resp 16   LMP 10/18/2016   SpO2 95%   Breastfeeding? Unknown   LABS:             Recent Labs    07/27/17 0900 07/28/17 0600  WBC 18.2* 16.8*  HGB 14.3 11.6*  PLT 398 339               Blood type: --/--/A POS, A POS (12/25 1122)  Rubella:   Immune             Flu and tdap -current 2018                    I&O: Intake/Output      12/25 0701 - 12/26 0700 12/26 0701 - 12/27 0700   Urine 550    Blood 375    Total Output 925    Net -925                     Physical Exam:             Alert and oriented X3  Abdomen: soft, non-tender, non-distended              Fundus: firm, non-tender, Ueven  Perineum: no edema  Lochia: light  Extremities: trace pedal edema, no calf pain or tenderness  A: PPD # 1 SVD with 2nd degree repair  Doing well - stable status  P: Routine post partum orders  DC home - awaiting Peds DC this PM  Marlinda MikeBAILEY, TANYA CNM, MSN, [facility] 07/28/2017, 9:14 AM

## 2017-07-28 NOTE — Discharge Summary (Signed)
Obstetric Discharge Summary Reason for Admission: onset of labor Prenatal Procedures: ultrasound for growth Intrapartum Procedures: spontaneous vaginal delivery and epidural Postpartum Procedures: none Complications-Operative and Postpartum: 2nd degree perineal laceration Hemoglobin  Date Value Ref Range Status  07/28/2017 11.6 (L) 12.0 - 15.0 g/dL Final   HCT  Date Value Ref Range Status  07/28/2017 34.9 (L) 36.0 - 46.0 % Final    Physical Exam:  General: alert, cooperative and no distress Lochia: appropriate Uterine Fundus: firm Perineum: 2nd degree repair DVT Evaluation: No evidence of DVT seen on physical exam.  Discharge Diagnoses: Term Pregnancy-delivered and PCOS (metformin pre-pregnancy dose)  Discharge Information: Date: 07/28/2017 Activity: pelvic rest Diet: routine Medications: PNV, Ibuprofen and METFORMIN AND EFFEXOR Condition: stable Instructions: refer to practice specific booklet Discharge to: home Follow-up Information    Olivia Mackieaavon, Richard, MD. Schedule an appointment as soon as possible for a visit in 6 week(s).   Specialty:  Obstetrics and Gynecology Contact information: [address] [place] [id number] [phone]           Newborn Data: Live born female  Birth Weight: 7 lb 6.9 oz (3370 g) APGAR: 9, 9  Newborn Delivery   Birth date/time:  07/27/2017 12:36:00 Delivery type:  Vaginal, Spontaneous     Home with mother.  Marlinda MikeBAILEY, Kendra 07/28/2017, 10:07 AM

## 2017-07-28 NOTE — Lactation Note (Signed)
This note was copied from a baby's chart. Lactation Consultation Note  Patient Name: Kendra PoloCharlee Volante UYQIH'KToday's Date: 07/28/2017 Reason for consult: Follow-up assessment   Follow up with mom of 28 hour old infant. Infant with 4 BF for 10-20 minutes, 4 BF attempts, Spoon fed 1 cc EBM, 3 voids and 6 stools in last 24 hours. [facility]. Infant weight 7 lb 3.7 oz with 3% weight loss since birth. LATCH scores 6-7.  Awakened infant to feed, infant awakened briefly and cried and would not latch. Hand expressed and spoon fed her 1 cc colostrum. Mom holding her STS at the breast.   Enc mom to offer the breast 8-12 x in 24 hours at first feeding cues. Enc mom to hand express with each feeding and offer infant EBM. Discussed with mom that infant may cluster feed tonight and to follow her lead. If infant does not begin feeding later this evening enc mom to to begin pumping and hand expressing. Mom voiced understanding.   [facility] Lelon MastSamantha to begin pumping tonight if infant does not increase feedings.   Mom to call out for feeding assistance as needed.    Maternal Data Formula Feeding for Exclusion: No Has patient been taught Hand Expression?: Yes Does the patient have breastfeeding experience prior to this delivery?: No  Feeding Feeding Type: Breast Fed Length of feed: 0 min  LATCH Score Latch: Too sleepy or reluctant, no latch achieved, no sucking elicited.  Audible Swallowing: None  Type of Nipple: Everted at rest and after stimulation  Comfort (Breast/Nipple): Soft / non-tender  Hold (Positioning): Assistance needed to correctly position infant at breast and maintain latch.  LATCH Score: 5  Interventions Interventions: Breast feeding basics reviewed;Adjust position;Assisted with latch;Support pillows;Skin to skin;Position options;Breast massage;Breast compression  Lactation Tools Discussed/Used     Consult Status Consult Status: Follow-up Date:  07/29/17 Follow-up type: In-patient    Kendra Nolan 07/28/2017, 5:06 PM

## 2017-07-29 ENCOUNTER — Ambulatory Visit: Payer: Self-pay

## 2017-07-29 LAB — RPR: RPR: NONREACTIVE

## 2017-07-29 NOTE — Lactation Note (Addendum)
This note was copied from a baby's chart. Lactation Consultation Note: Asked by RN to see mom. She has been crying because baby has not been feeding. Mom reports she will hold the breast or NS in her mouth and only take a few sucks then comes off the breast. Concerned that baby is not eating enough. Wants to give formula. Suggested using feeding tube/syringe to supplement while at the breast. Parents agreeable. Charlee latched well with NS and feeding tube/syringe and nursed for 10 min on first breast. Mom reports this is the best she has done. Latched to left breast and nursed for 5 min then going off to sleep. Waiting for Ped visit to determine if DC today. Mom has Medela pump for home. Encouraged to pump at least 4-6 times/day after nursing to promote milk supply. Can feed EBM to baby instead of formula in feeding tube/syringe. Reviewed our phone number and encouraged to make OP appointment for assist with latch without NS if she is still having trouble. #20 NS given to mom as #16 looks tight. No questions at present. Encouragement given.   Patient Name: Kendra Nolan ZOXWR'UToday's Date: 07/29/2017 Reason for consult: Follow-up assessment   Maternal Data Formula Feeding for Exclusion: No Has patient been taught Hand Expression?: Yes  Feeding Feeding Type: Breast Milk with Formula added Length of feed: 15 min  LATCH Score Latch: Grasps breast easily, tongue down, lips flanged, rhythmical sucking.  Audible Swallowing: Spontaneous and intermittent  Type of Nipple: Everted at rest and after stimulation  Comfort (Breast/Nipple): Soft / non-tender  Hold (Positioning): Assistance needed to correctly position infant at breast and maintain latch.  LATCH Score: 9  Interventions Interventions: Breast feeding basics reviewed;Assisted with latch;Support pillows;Breast massage;Breast compression  Lactation Tools Discussed/Used Tools: 64F feeding tube / Syringe Nipple shield size: 16 Breast pump  type: Manual WIC Program: No   Consult Status Consult Status: Follow-up Date: 07/30/17 Follow-up type: In-patient    Kendra Nolan, Kendra Nolan 07/29/2017, 10:11 AM

## 2017-07-29 NOTE — Lactation Note (Signed)
This note was copied from a baby's chart. Lactation Consultation Note Baby 2038 hrs old. Mom stated the baby has finally started BF well. Mom had pacifier in her mouth. Discouraged pacifiers for 2 weeks d/t nipple confusion. Baby having short feedings. Educated on newborn feeding habits, STS, cluster feedings, supply and demand.  Mom has everted nipples at the bottom of mom's breast turning inwards towards abd. Encouraged mom to assess breast before and afterwards. Mom has colostrum. Encouraged mom to occasionally massage breast during BF unless notices mom coughing when latched and feeding.  Discussed colostrum consistency, filling, transitional milk, mature milk 3-5 days.  Mom is spoon feeding colostrum d/t not latching at times, then having short feeding of 5-10 minutes. Explained to mom baby should be cluster feeding at this time and feeding 20-40 minutes every hours to establish milk supply.  LC not wanting baby going home w/short feedings and spoon feedings. Encouraged mom to burp baby if stops BF before 20 min. Stimulate baby to cont. To feed at least 20-30 minutes. Stressed I&O. Encouraged to call for assistance in feeding or questions.   Discussed breast filling, engorgement management, I&O. LC recommends that LC evaluates BF and I&O before d/c home.  Reminded of OP services.                                                                                                                                                                                                                                                         Patient Name: Kendra PoloCharlee Nolan JYNWG'NToday's Date: 07/29/2017 Reason for consult: Follow-up assessment   Maternal Data    Feeding Feeding  Type: Breast Fed Length of feed: 10 min  LATCH Score       Type of Nipple: Everted at rest and after stimulation  Comfort (Breast/Nipple): Filling, red/small blisters or bruises, mild/mod discomfort        Interventions Interventions: Breast feeding basics reviewed;Breast compression;Skin to skin;Breast massage;Position options;Hand express  Lactation Tools Discussed/Used     Consult Status Consult Status: Follow-up Date: 07/29/17 Follow-up type: In-patient    Charyl DancerCARVER, Barrington Worley G 07/29/2017, 2:36 AM

## 2017-07-29 NOTE — Lactation Note (Signed)
This note was copied from a baby's chart. Lactation Consultation Note RN called into rm. Baby 39 hrs old. Fussy.assisted in football hold. Arching, screaming will not suckle on breast. Noted some gas. LC sat up and patted, noted burps. Placed back into football position. Note screaming, but would cry. Baby would suckle a few times then stop. Spoon fed 1 ml colostrum.  Baby unable hold latch. Mom has small short shaft compressible nipple. Nipple to underside of breast. Mom holding breast up. Fitted #16 NS. Baby suckle a few times then cry. This goes on for a long while. Would suckle on gloved finger, then put onto NS. Baby mad, hungry acting. Encouraged mom not to stimulate baby, not to rub, just talk softly and hold on breast. Baby suckle occasionally, finally falling asleep. Encouraged mom to hold baby at breast w/NS in mouth as long as baby will hold it. Baby suckles occasionally. Patient Name: Kendra PoloCharlee Nolan ZOXWR'UToday's Date: 07/29/2017 Reason for consult: Follow-up assessment;Difficult latch;Mother's request   Maternal Data    Feeding Feeding Type: Breast Fed Length of feed: 0 min  LATCH Score Latch: Too sleepy or reluctant, no latch achieved, no sucking elicited.  Audible Swallowing: None  Type of Nipple: Everted at rest and after stimulation  Comfort (Breast/Nipple): Filling, red/small blisters or bruises, mild/mod discomfort  Hold (Positioning): Full assist, staff holds infant at breast  LATCH Score: 3  Interventions Interventions: Breast feeding basics reviewed;Breast compression;Assisted with latch;Hand pump;Adjust position;Support pillows;Skin to skin;Breast massage;Position options;Hand express;Expressed milk;Pre-pump if needed  Lactation Tools Discussed/Used Tools: Pump;Nipple Shields Nipple shield size: 16 Breast pump type: Manual Pump Review: Setup, frequency, and cleaning;Milk Storage Initiated by:: RN Date initiated:: 07/28/17   Consult Status Consult Status:  Follow-up Date: 07/29/17 Follow-up type: In-patient    Charyl DancerCARVER, Kendra Nolan 07/29/2017, 3:55 AM

## 2017-08-02 ENCOUNTER — Other Ambulatory Visit: Payer: Self-pay | Admitting: Physician Assistant

## 2017-08-02 ENCOUNTER — Other Ambulatory Visit: Payer: Self-pay

## 2017-08-02 DIAGNOSIS — F320 Major depressive disorder, single episode, mild: Secondary | ICD-10-CM

## 2017-08-02 MED ORDER — VENLAFAXINE HCL ER 150 MG PO CP24: 150.0000 mg | ORAL_CAPSULE | Freq: Every day | ORAL | 0 refills | Status: DC

## 2017-08-02 NOTE — Progress Notes (Signed)
Ok to restart effexor. Sent to pharmacy.

## 2017-08-02 NOTE — Progress Notes (Signed)
Ok to restart effexor.

## 2017-09-13 LAB — HM PAP SMEAR: HM Pap smear: NEGATIVE

## 2018-01-24 ENCOUNTER — Encounter: Payer: Self-pay | Admitting: Sports Medicine

## 2018-02-15 ENCOUNTER — Other Ambulatory Visit (HOSPITAL_COMMUNITY): Payer: Self-pay | Admitting: Surgery

## 2018-03-03 ENCOUNTER — Other Ambulatory Visit: Payer: Self-pay

## 2018-03-03 ENCOUNTER — Ambulatory Visit (HOSPITAL_COMMUNITY)
Admission: RE | Admit: 2018-03-03 | Discharge: 2018-03-03 | Disposition: A | Discharge status: Home / Self Care | Payer: BLUE CROSS/BLUE SHIELD | Source: Ambulatory Visit | Attending: Surgery | Admitting: Surgery

## 2018-03-03 DIAGNOSIS — Z01818 Encounter for other preprocedural examination: Secondary | ICD-10-CM | POA: Insufficient documentation

## 2018-03-03 DIAGNOSIS — K224 Dyskinesia of esophagus: Secondary | ICD-10-CM | POA: Insufficient documentation

## 2018-03-11 ENCOUNTER — Emergency Department
Admission: EM | Admit: 2018-03-11 | Discharge: 2018-03-11 | Disposition: A | Discharge status: Home / Self Care | Payer: BLUE CROSS/BLUE SHIELD | Source: Home / Self Care | Attending: Family Medicine | Admitting: Family Medicine

## 2018-03-11 ENCOUNTER — Encounter: Payer: Self-pay | Admitting: Emergency Medicine

## 2018-03-11 DIAGNOSIS — B9789 Other viral agents as the cause of diseases classified elsewhere: Secondary | ICD-10-CM | POA: Diagnosis not present

## 2018-03-11 DIAGNOSIS — J069 Acute upper respiratory infection, unspecified: Secondary | ICD-10-CM | POA: Diagnosis not present

## 2018-03-11 LAB — POCT RAPID STREP A (OFFICE): Rapid Strep A Screen: NEGATIVE

## 2018-03-11 NOTE — Discharge Instructions (Signed)
°  You may take 500mg  acetaminophen every 4-6 hours or in combination with ibuprofen 400-600mg  every 6-8 hours as needed for pain, inflammation, and fever.  Be sure to stay well hydrated and get at least 8 hours of sleep at night, preferably more while sick.   Please follow up with family medicine in 7-10 days if not improving, sooner if significantly worsening.

## 2018-03-11 NOTE — ED Provider Notes (Signed)
[facility]    CSN: [id number] Arrival date & time: 03/11/18  1239     History   Chief Complaint Chief Complaint  Patient presents with  . Sore Throat    HPI Kendra Nolan is a 27 y.o. female.   HPI  Kendra Nolan is a 27 y.o. female presenting to UC with c/o sore throat, bilateral ear pain, and mild cough for 2 weeks. She completed a course of doxycycline about 3 days ago. Her sinus congestion and pressure had resolved but now she feels her symptoms are returning. Throat pain is most bothersome, mild to moderate in severity. Able to keep down fluids. No difficulty breathing. No medication taken today. Denies fever, n/v/d.    Past Medical History:  Diagnosis Date  . Migraine     Patient Active Problem List   Diagnosis Date Noted  . Pregnancy 07/27/2017  . SVD (spontaneous vaginal delivery) 07/27/2017  . Postpartum care following vaginal delivery (12/25) 07/27/2017  . Second-degree perineal laceration, with delivery 07/27/2017  . Otitis media with effusion, right 07/10/2016  . Abdominal pain, epigastric 02/27/2016  . Infertility associated with anovulation 12/10/2015  . Irregular menstrual cycle 07/09/2015  . GERD (gastroesophageal reflux disease) 03/07/2015  . Plantar fasciitis, bilateral 05/14/2014  . Major depression 02/23/2013  . Preventive measure 11/16/2012  . Migraines 11/16/2012  . Obesity 11/16/2012  . Allergic rhinitis 11/16/2012    Past Surgical History:  Procedure Laterality Date  . TONSILLECTOMY    . TONSILLECTOMY AND ADENOIDECTOMY      OB History    Gravida  2   Para  1   Term  1   Preterm      AB  1   Living  1     SAB  1   TAB      Ectopic      Multiple  0   Live Births  1            Home Medications    Prior to Admission medications   Medication Sig Start Date End Date Taking? Authorizing Provider  acetaminophen (TYLENOL) 500 MG tablet Take 1,000 mg by mouth every 6 (six) hours as needed for mild  pain or headache.    [provider]  cetirizine (ZYRTEC) 10 MG tablet Take 10 mg by mouth at bedtime.    [provider]  esomeprazole (NEXIUM) 40 MG capsule One tab by mouth BID Patient taking differently: Take 40 mg by mouth at bedtime. One tab by mouth BID 02/27/16   Monica Bectonhekkekandam, Thomas J, MD  ibuprofen (ADVIL,MOTRIN) 600 MG tablet Take 1 tablet (600 mg total) by mouth every 6 (six) hours. 07/28/17   Marlinda MikeBailey, Tanya, CNM  metFORMIN (GLUCOPHAGE) 500 MG tablet Take 500 mg by mouth 2 (two) times daily. 05/07/17   [provider]  Prenatal Vit-Fe Fumarate-FA (PRENATAL MULTIVITAMIN) TABS tablet Take 1 tablet by mouth at bedtime.    [provider]  venlafaxine XR (EFFEXOR-XR) 150 MG 24 hr capsule Take 1 capsule (150 mg total) by mouth at bedtime. 08/02/17   Jomarie LongsBreeback, Jade L, PA-C    Family History Family History  Problem Relation Age of Onset  . Cancer Maternal Aunt   . Hypertension Mother   . Hyperlipidemia Father   . Hyperlipidemia Maternal Grandmother   . Hyperlipidemia Maternal Grandfather   . Hypertension Maternal Grandfather   . Hyperlipidemia Paternal Grandmother   . Hyperlipidemia Paternal Grandfather     Social History Social History  Tobacco Use  . Smoking status: Never Smoker  . Smokeless tobacco: Never Used  Substance Use Topics  . Alcohol use: No  . Drug use: No     Allergies   Codeine; Clarithromycin; and Penicillins   Review of Systems Review of Systems  Constitutional: Negative for chills and fever.  HENT: Positive for congestion, ear pain and sore throat. Negative for trouble swallowing and voice change.   Respiratory: Positive for cough. Negative for shortness of breath.   Cardiovascular: Negative for chest pain and palpitations.  Gastrointestinal: Negative for abdominal pain, diarrhea, nausea and vomiting.  Musculoskeletal: Negative for arthralgias, back pain and myalgias.  Skin: Negative for rash.     Physical  Exam Triage Vital Signs ED Triage Vitals  Enc Vitals Group     BP 03/11/18 1308 127/84     Pulse Rate 03/11/18 1308 92     Resp --      Temp 03/11/18 1308 98 F (36.7 C)     Temp Source 03/11/18 1308 Oral     SpO2 03/11/18 1308 96 %     Weight 03/11/18 1309 200 lb (90.7 kg)     Height --      Head Circumference --      Peak Flow --      Pain Score 03/11/18 1309 0     Pain Loc --      Pain Edu? --      Excl. in GC? --    No data found.  Updated Vital Signs BP 127/84 (BP Location: Right Arm)   Pulse 92   Temp 98 F (36.7 C) (Oral)   Wt 200 lb (90.7 kg)   SpO2 96%   BMI 35.43 kg/m   Visual Acuity Right Eye Distance:   Left Eye Distance:   Bilateral Distance:    Right Eye Near:   Left Eye Near:    Bilateral Near:     Physical Exam  Constitutional: She is oriented to person, place, and time. She appears well-developed and well-nourished.  Non-toxic appearance. She does not appear ill. No distress.  HENT:  Head: Normocephalic and atraumatic.  Right Ear: Tympanic membrane normal.  Left Ear: Tympanic membrane normal.  Nose: Nose normal.  Mouth/Throat: Uvula is midline and mucous membranes are normal. Posterior oropharyngeal erythema present. No oropharyngeal exudate, posterior oropharyngeal edema or tonsillar abscesses.  Eyes: EOM are normal.  Neck: Normal range of motion. Neck supple.  Cardiovascular: Normal rate and regular rhythm.  Pulmonary/Chest: Effort normal and breath sounds normal. No stridor. She has no rhonchi.  Musculoskeletal: Normal range of motion.  Lymphadenopathy:    She has no cervical adenopathy.  Neurological: She is alert and oriented to person, place, and time.  Skin: Skin is warm and dry.  Psychiatric: She has a normal mood and affect. Her behavior is normal.  Nursing note and vitals reviewed.    UC Treatments / Results  Labs (all labs ordered are listed, but only abnormal results are displayed) Labs Reviewed  POCT RAPID STREP A  (OFFICE)    EKG None  Radiology No results found.  Procedures Procedures (including critical care time)  Medications Ordered in UC Medications - No data to display  Initial Impression / Assessment and Plan / UC Course  I have reviewed the triage vital signs and the nursing notes.  Pertinent labs & imaging results that were available during my care of the patient were reviewed by me and considered in my medical decision making (see chart  for details).     Rapid strep: NEGATIVE Reassured pt. Symptoms appear viral at this time. Encouraged symptomatic treatment  Final Clinical Impressions(s) / UC Diagnoses   Final diagnoses:  Viral URI with cough     Discharge Instructions      You may take 500mg  acetaminophen every 4-6 hours or in combination with ibuprofen 400-600mg  every 6-8 hours as needed for pain, inflammation, and fever.  Be sure to stay well hydrated and get at least 8 hours of sleep at night, preferably more while sick.   Please follow up with family medicine in 7-10 days if not improving, sooner if significantly worsening.     ED Prescriptions    None     Controlled Substance Prescriptions [place] Controlled Substance Registry consulted? Not Applicable   Rolla Plate 03/11/18 1851

## 2018-03-11 NOTE — ED Triage Notes (Signed)
Pt c/o sore throat, ear pain and cough x2 weeks. States she took doxy and felt better then sxs returned this week.

## 2018-03-14 ENCOUNTER — Ambulatory Visit: Payer: BLUE CROSS/BLUE SHIELD | Admitting: Skilled Nursing Facility1

## 2018-03-15 ENCOUNTER — Ambulatory Visit (INDEPENDENT_AMBULATORY_CARE_PROVIDER_SITE_OTHER): Payer: BLUE CROSS/BLUE SHIELD | Admitting: Family Medicine

## 2018-03-15 ENCOUNTER — Encounter: Payer: Self-pay | Admitting: Family Medicine

## 2018-03-15 VITALS — BP 126/72 | HR 93 | Ht 61.0 in | Wt 294.0 lb

## 2018-03-15 DIAGNOSIS — J0101 Acute recurrent maxillary sinusitis: Secondary | ICD-10-CM | POA: Diagnosis not present

## 2018-03-15 MED ORDER — AZITHROMYCIN 250 MG PO TABS: ORAL_TABLET | ORAL | 0 refills | Status: AC

## 2018-03-15 NOTE — Progress Notes (Signed)
   Subjective:    Patient ID: Kendra Nolan, female    DOB: Mar 31, 1991, 27 y.o.   MRN: [id number]  HPI She was actually seen in urgent care on August 9 approximately 4 days ago for sore throat and bilateral ear pain and mild cough that she Artie had for about 2 weeks.  She just completed a course of doxycycline 3 days prior to that visit.  Rep was negative and they felt like her symptoms were more likely to be viral.  She is complaining particularly of left ear pain. Using flonase or Clairitin. No fever but has had some sweats.    Review of Systems     Objective:   Physical Exam  Constitutional: She is oriented to person, place, and time. She appears well-developed and well-nourished.  HENT:  Head: Normocephalic and atraumatic.  Right Ear: External ear normal.  Left Ear: External ear normal.  Nose: Nose normal.  Mouth/Throat: Oropharynx is clear and moist.  TMs and canals are clear.  She has a few bubbles behind the right tympanic membrane but no significant erythema or thickening of the membrane.  Eyes: Pupils are equal, round, and reactive to light. EOM are normal. Right eye exhibits no discharge. Left eye exhibits no discharge.  Right sclera is very injected.  The left is normal.  Neck: Neck supple. No thyromegaly present.  Cardiovascular: Normal rate, regular rhythm and normal heart sounds.  Pulmonary/Chest: Effort normal and breath sounds normal. She has no wheezes.  Lymphadenopathy:    She has no cervical adenopathy.  Neurological: She is alert and oriented to person, place, and time.  Skin: Skin is warm and dry.  Psychiatric: She has a normal mood and affect.       Assessment & Plan:  Acute right maxillary sinusitis with concomitant conjunctivitis -we will treat with azithromycin based on allergies.  She says she has taken it several times before even though she does have clarithromycin listed on her medication list and says she is done perfectly fine with it.  She is okay  with trying to take it.  If she starts to develop any symptoms whatsoever she needs to stop it immediately and take Benadryl.

## 2018-03-29 ENCOUNTER — Encounter: Payer: Self-pay | Admitting: Registered"

## 2018-03-29 ENCOUNTER — Encounter: Payer: BLUE CROSS/BLUE SHIELD | Attending: Surgery | Admitting: Registered"

## 2018-03-29 DIAGNOSIS — Z79899 Other long term (current) drug therapy: Secondary | ICD-10-CM | POA: Insufficient documentation

## 2018-03-29 DIAGNOSIS — Z8249 Family history of ischemic heart disease and other diseases of the circulatory system: Secondary | ICD-10-CM | POA: Insufficient documentation

## 2018-03-29 DIAGNOSIS — Z8261 Family history of arthritis: Secondary | ICD-10-CM | POA: Insufficient documentation

## 2018-03-29 DIAGNOSIS — G43909 Migraine, unspecified, not intractable, without status migrainosus: Secondary | ICD-10-CM | POA: Insufficient documentation

## 2018-03-29 DIAGNOSIS — Z6841 Body Mass Index (BMI) 40.0 and over, adult: Secondary | ICD-10-CM | POA: Insufficient documentation

## 2018-03-29 DIAGNOSIS — K219 Gastro-esophageal reflux disease without esophagitis: Secondary | ICD-10-CM | POA: Insufficient documentation

## 2018-03-29 DIAGNOSIS — F329 Major depressive disorder, single episode, unspecified: Secondary | ICD-10-CM | POA: Insufficient documentation

## 2018-03-29 DIAGNOSIS — Z713 Dietary counseling and surveillance: Secondary | ICD-10-CM | POA: Insufficient documentation

## 2018-03-29 DIAGNOSIS — E669 Obesity, unspecified: Secondary | ICD-10-CM

## 2018-03-29 DIAGNOSIS — F419 Anxiety disorder, unspecified: Secondary | ICD-10-CM | POA: Insufficient documentation

## 2018-03-29 NOTE — Progress Notes (Signed)
Pre-Op Assessment Visit: Pre-Operative Sleeve Gastrectomy Surgery  Medical Nutrition Therapy:  Appt start time: 3:10  End time:  3:50  Patient was seen on 03/29/2018 for Pre-Operative Nutrition Assessment. Assessment [facility] on 03/29/2018.   Pt expectation of surgery: healthier for daughter, be more active  Pt expectation of Dietitian: none stated  Start weight at [facility]: 290.6 BMI: 52.73   Pt states her aunts have had bariatric surgery. Pt states she has an 518 month old daughter. Pt states she has PCOS.   Per insurance, pt needs 1 SWL visits prior to surgery.    24 hr Dietary Recall: First Meal: pop tart  Snack: apples + peanut butter Second Meal: barbecue chips, [place]key sandwich Snack: peanuts  Third Meal: chicken tenders, fries Snack: none Beverages: diet Dr. Reino KentPepper, sweet tea, water,   Encouraged to engage in 75 minutes of moderate physical activity including cardiovascular and weight baring weekly  Handouts given during visit include:  . Pre-Op Goals . Bariatric Surgery Protein Shakes . Vitamin and Mineral Recommendations  During the appointment today the following Pre-Op Goals were reviewed with the patient: . Track your food and beverage: MyFitness Pal or Baritastic App . Make healthy food choices . Begin to limit portion sizes . Limited concentrated sugars and fried foods . Keep fat/sugar in the single digits per serving on             food labels . Practice CHEWING your food  (aim for 30 chews per bite or until applesauce consistency) . Practice not drinking 15 minutes before, during, and 30 minutes after each meal/snack . Avoid all carbonated beverages  . Avoid/limit caffeinated beverages  . Avoid all sugar-sweetened beverages . Avoid alcohol . Consume 3 meals per day; eat every 3-5 hours . Make a list of non-food related activities . Aim for 64-100 ounces of FLUID daily   . Aim for at least 60-80 grams of PROTEIN daily . Look for a liquid protein source that contain ?15 g protein and ?5 g carbohydrate  (ex: shakes, drinks, shots) . Physical activity is an important part of a healthy lifestyle so keep it moving!  Follow diet recommendations listed below Energy and Macronutrient Recommendations: Calories: 1800 Carbohydrate: 200 Protein: 135 Fat: 50  Demonstrated degree of understanding via:  Teach Back   Teaching Method Utilized:  Visual Auditory Hands on  Barriers to learning/adherence to lifestyle change: none identified  Patient to call the [facility] to enroll in Pre-Op and Post-Op Nutrition Education when surgery date is scheduled.

## 2018-04-08 ENCOUNTER — Encounter: Payer: Self-pay | Admitting: Sports Medicine

## 2018-04-28 ENCOUNTER — Encounter: Payer: Self-pay | Admitting: Registered"

## 2018-04-28 ENCOUNTER — Encounter: Payer: BLUE CROSS/BLUE SHIELD | Attending: Surgery | Admitting: Registered"

## 2018-04-28 DIAGNOSIS — G43909 Migraine, unspecified, not intractable, without status migrainosus: Secondary | ICD-10-CM | POA: Diagnosis not present

## 2018-04-28 DIAGNOSIS — Z713 Dietary counseling and surveillance: Secondary | ICD-10-CM | POA: Insufficient documentation

## 2018-04-28 DIAGNOSIS — E669 Obesity, unspecified: Secondary | ICD-10-CM

## 2018-04-28 DIAGNOSIS — Z8249 Family history of ischemic heart disease and other diseases of the circulatory system: Secondary | ICD-10-CM | POA: Diagnosis not present

## 2018-04-28 DIAGNOSIS — F329 Major depressive disorder, single episode, unspecified: Secondary | ICD-10-CM | POA: Diagnosis not present

## 2018-04-28 DIAGNOSIS — Z79899 Other long term (current) drug therapy: Secondary | ICD-10-CM | POA: Insufficient documentation

## 2018-04-28 DIAGNOSIS — Z8261 Family history of arthritis: Secondary | ICD-10-CM | POA: Diagnosis not present

## 2018-04-28 DIAGNOSIS — Z6841 Body Mass Index (BMI) 40.0 and over, adult: Secondary | ICD-10-CM | POA: Diagnosis not present

## 2018-04-28 DIAGNOSIS — F419 Anxiety disorder, unspecified: Secondary | ICD-10-CM | POA: Diagnosis not present

## 2018-04-28 DIAGNOSIS — K219 Gastro-esophageal reflux disease without esophagitis: Secondary | ICD-10-CM | POA: Diagnosis not present

## 2018-04-28 NOTE — Progress Notes (Signed)
Sleeve Gastrectomy Appt start time: 4:10 end time: 4:25  Assessment: 1st SWL Appointment.   Start Wt at [facility]: 290.6 Wt: 293.2 BMI: 53.20   Pt arrives having gained 2.6 lbs from previous visit.   Pt states she is doing well with pre-op goals. Pt states she has reduced intake of concentrated sugars and fried foods. Pt states she is drinking about 20-30 ounces of fluid a day. Pt states its hard to chew as many times as required. Pt states it is hard to not drink with meals and still working on it. Pt states she feels like she has to have something to drink with meals.   Pt states she feels that she is ready for surgery. Per insurance, pt needs 1 SWL visits prior to surgery.    MEDICATIONS: See list   DIETARY INTAKE:  24-hr recall:  B ( AM): blueberry muffins  Snk ( AM): peanuts  L ( PM): [employer] + chips (sometimes) Snk ( PM): almond protein bars D ( PM): baked spaghetti + salad Snk ( PM): none Beverages: diet Dr. Reino Kent, water, sweet tea (once/day)  Usual physical activity: ADLs  Diet to Follow: 1800 calories 200 g carbohydrates 135 g protein 50 g fat  Preferred Learning Style:   No preference indicated   Learning Readiness:   Ready  Change in progress     Nutritional Diagnosis:  [place]-3.3 Overweight/obesity related to past poor dietary habits and physical inactivity as evidenced by patient w/ planned sleeve gastrectomy surgery following dietary guidelines for continued weight loss.    Intervention:  Nutrition counseling for upcoming  Bariatric Surgery. Pt was educated and counseled on the importance of not drinking around eating, drinking at least 64 ounces of fluid a day, chewing at least 30 times per bite, and ways to decrease sweet tea consumption. Pt was encouraged to have another SWL visit prior to surgery to prepare. Pt states she will verify insurance coverage to potential SWL visit. Pt was in agreement with goals listed.  Goals:  - Aim for 150 minutes  of physical activity including cardio and weight bearing every week - Aim to reduce sweet tea. Replace with sweet tea flavor packs or flavoring.  - Aim to increase fluid intake to at least 64 ounces of fluid a day.  - Aim to sip throughout the day.  - Continue to aim to chew at least 30 times per bite.  - Continue to aim to not drink 15 minutes before eating, not while eating, and waiting 30 minutes after eating to drink.  Teaching Method Utilized:  Visual Auditory Hands on  Handouts given during visit include:  none  Barriers to learning/adherence to lifestyle change: none identified  Demonstrated degree of understanding via:  Teach Back   Monitoring/Evaluation:  Dietary intake, exercise, and body weight prn.

## 2018-04-28 NOTE — Patient Instructions (Signed)
-   Aim to reduce sweet tea. Replace with sweet tea flavor packs or flavoring.   - Aim to increase fluid intake to at least 64 ounces of fluid a day.   - Aim to sip throughout the day.   - Continue to aim to chew at least 30 times per bite.   - Continue to aim to not drink 15 minutes before eating, not while eating, and waiting 30 minutes after eating to drink.

## 2018-05-30 ENCOUNTER — Encounter: Payer: Self-pay | Admitting: Skilled Nursing Facility1

## 2018-05-30 ENCOUNTER — Encounter: Payer: BLUE CROSS/BLUE SHIELD | Attending: Surgery | Admitting: Skilled Nursing Facility1

## 2018-05-30 DIAGNOSIS — Z713 Dietary counseling and surveillance: Secondary | ICD-10-CM | POA: Insufficient documentation

## 2018-05-30 DIAGNOSIS — F419 Anxiety disorder, unspecified: Secondary | ICD-10-CM | POA: Insufficient documentation

## 2018-05-30 DIAGNOSIS — Z8261 Family history of arthritis: Secondary | ICD-10-CM | POA: Insufficient documentation

## 2018-05-30 DIAGNOSIS — Z8249 Family history of ischemic heart disease and other diseases of the circulatory system: Secondary | ICD-10-CM | POA: Insufficient documentation

## 2018-05-30 DIAGNOSIS — K219 Gastro-esophageal reflux disease without esophagitis: Secondary | ICD-10-CM | POA: Diagnosis not present

## 2018-05-30 DIAGNOSIS — Z6841 Body Mass Index (BMI) 40.0 and over, adult: Secondary | ICD-10-CM | POA: Diagnosis not present

## 2018-05-30 DIAGNOSIS — F329 Major depressive disorder, single episode, unspecified: Secondary | ICD-10-CM | POA: Insufficient documentation

## 2018-05-30 DIAGNOSIS — G43909 Migraine, unspecified, not intractable, without status migrainosus: Secondary | ICD-10-CM | POA: Insufficient documentation

## 2018-05-30 DIAGNOSIS — E669 Obesity, unspecified: Secondary | ICD-10-CM

## 2018-05-30 DIAGNOSIS — Z79899 Other long term (current) drug therapy: Secondary | ICD-10-CM | POA: Diagnosis not present

## 2018-05-30 NOTE — Progress Notes (Signed)
Pre-Operative Nutrition Class:  Appt start time: 2505   End time:  1830.  Patient was seen on 05/30/2018 for Pre-Operative Bariatric Surgery Education at [facility].   Surgery date:  Surgery type: sleeve Start weight at [facility]: 290.6 Weight today: 293  Samples given per MNT protocol. Patient educated on appropriate usage: Bariatric Advantage Multivitamin Lot # [id number] Exp: 10/20  Bariatric Advantage Calcium  Lot # [id number] Exp:aug-23-2020  Unjury Protein Powder  Shake Lot # [id number] Exp: march 8 20  The following the learning objectives were met by the patient during this course:  Identify Pre-Op Dietary Goals and will begin 2 weeks pre-operatively  Identify appropriate sources of fluids and proteins   State protein recommendations and appropriate sources pre and post-operatively  Identify Post-Operative Dietary Goals and will follow for 2 weeks post-operatively  Identify appropriate multivitamin and calcium sources  Describe the need for physical activity post-operatively and will follow MD recommendations  State when to call healthcare provider regarding medication questions or post-operative complications  Handouts given during class include:  Pre-Op Bariatric Surgery Diet Handout  Protein Shake Handout  Post-Op Bariatric Surgery Nutrition Handout  BELT Program Information Flyer  Support Group Information Flyer  [facility] Bariatric Supplements Price List  Follow-[facility] Plan: [facility] diet advancement per MD.

## 2018-06-15 ENCOUNTER — Ambulatory Visit (INDEPENDENT_AMBULATORY_CARE_PROVIDER_SITE_OTHER): Payer: BLUE CROSS/BLUE SHIELD | Admitting: Physician Assistant

## 2018-06-15 DIAGNOSIS — Z23 Encounter for immunization: Secondary | ICD-10-CM

## 2018-06-20 ENCOUNTER — Other Ambulatory Visit: Payer: Self-pay | Admitting: Physician Assistant

## 2018-06-20 DIAGNOSIS — F320 Major depressive disorder, single episode, mild: Secondary | ICD-10-CM

## 2018-07-13 ENCOUNTER — Encounter (HOSPITAL_COMMUNITY): Payer: Self-pay

## 2018-07-13 ENCOUNTER — Ambulatory Visit: Payer: Self-pay | Admitting: Surgery

## 2018-07-13 NOTE — H&P (View-Only) (Signed)
Surgical H&P  CC: morbid obesity  HPI:She returns today for her preoperative appointment, surgery is this Monday the 16th. She has completed the preoperative workup with no obstacles identified.  She has some insightful questions today that she would like to discuss.  Her weight today is down to 286 pounds, BMI 51.48.  Psych-Dr. Cyndia SkeetersLurey 03/22/18- approved, she scored 3 standard deviations above the mean for diffuse emotional eating, specific emotional eating and external food Q eating on the duct behaviors questionnaire and therefore is recommended for further follow-up to address this she scored 2 standard deviations below the mean for use of cognitive restraint rules and regulations eating.  She also scored fairly high on the back depression inventory, some scored significantly high on the symptoms checklist 90 including aspirin hostility and paranoid ideation, reflecting a tendency to suppress rather than express complaints regarding any psychologic or physical accident and she may experience. Nutrition- approved  CXR/UGI 03/03/18- mild esophageal dysmotility, no hiatal hernia or reflux Labs-  Initial visit 02/10/18: Otherwise relatively healthy young woman is here today to discuss bariatric surgery. She has been overweight essentially since starting high school but her weight had been steady and the 260s to 80s for many years until she recently had a baby and then the postpartum weight has been difficult to lose. She has several family members who have had either gastric bypass or sleeve gastrectomy and has seen good results. Her main goal for pursuing bariatric surgery is to be healthier so that she can be there for her daughter who is now about 67 months old. In the past she has tried various diets and exercise programs, phentermine, supervised weight loss programs, and may lose a few pounds but then ultimately gained back. Her current lifestyle she states is fairly high carb. She does consume  limited sodas and tea. She does not exercise currently. She does have some reflux which is relieved with over-the-counter daily medication. She is not a smoker. 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 Initial date weight 302.25lb/ BMI 54.4  Allergies  Allergen Reactions  . Codeine Hives    Codeine cough syrup  . Clarithromycin Hives  . Penicillins Hives    Has patient had a PCN reaction causing immediate rash, facial/tongue/throat swelling, SOB or lightheadedness with hypotension: Yes Has patient had a PCN reaction causing severe rash involving mucus membranes or skin necrosis: Yes Has patient had a PCN reaction that required hospitalization: No Has patient had a PCN reaction occurring within the last 10 years: No If all of the above answers are "NO", then may proceed with Cephalosporin use.     Past Medical History:  Diagnosis Date  . Migraine     Past Surgical History:  Procedure Laterality Date  . TONSILLECTOMY    . TONSILLECTOMY AND ADENOIDECTOMY      Family History  Problem Relation Age of Onset  . Cancer Maternal Aunt   . Hypertension Mother   . Hyperlipidemia Father   . Hyperlipidemia Maternal Grandmother   . Hyperlipidemia Maternal Grandfather   . Hypertension Maternal Grandfather   . Hyperlipidemia Paternal Grandmother   . Hyperlipidemia Paternal Grandfather   . Asthma Other   . COPD Other     Social History   Socioeconomic History  . Marital status: Married    Spouse name: Not on file  . Number of children: Not  on file  . Years of education: Not on file  . Highest education level: Not on file  Occupational History  . Not on file  Social Needs  . Financial resource strain: Not on file  . Food insecurity:    Worry: Not on file    Inability: Not on file  . Transportation needs:    Medical: Not on file    Non-medical: Not on file   Tobacco Use  . Smoking status: Never Smoker  . Smokeless tobacco: Never Used  Substance and Sexual Activity  . Alcohol use: No  . Drug use: No  . Sexual activity: Never  Lifestyle  . Physical activity:    Days per week: Not on file    Minutes per session: Not on file  . Stress: Not on file  Relationships  . Social connections:    Talks on phone: Not on file    Gets together: Not on file    Attends religious service: Not on file    Active member of club or organization: Not on file    Attends meetings of clubs or organizations: Not on file    Relationship status: Not on file  Other Topics Concern  . Not on file  Social History Narrative  . Not on file    Current Outpatient Medications on File Prior to Visit  Medication Sig Dispense Refill  . esomeprazole (NEXIUM) 40 MG capsule One tab by mouth BID (Patient not taking: Reported on 07/08/2018) 60 capsule 3  . Multiple Vitamin (MULTIVITAMIN WITH MINERALS) TABS tablet Take 1 tablet by mouth daily.    Marland Kitchen omeprazole (PRILOSEC) 20 MG capsule Take 20 mg by mouth daily.    . TAYTULLA 1-20 MG-MCG(24) CAPS Take 1 tablet by mouth daily.  11  . venlafaxine XR (EFFEXOR-XR) 150 MG 24 hr capsule TAKE 1 CAPSULE (150 MG TOTAL) BY MOUTH AT BEDTIME. 90 capsule 0   No current facility-administered medications on file prior to visit.     Review of Systems: a complete, 10pt review of systems was completed with pertinent positives and negatives as documented in the HPI  Physical Exam: There were no vitals filed for this visit. Gen: A&Ox3, no distress  Head: normocephalic, atraumatic Eyes: extraocular motions intact, anicteric.  Neck: supple without mass or thyromegaly Chest: unlabored respirations, symmetrical air entry, clear bilaterally   Cardiovascular: RRR with palpable distal pulses, no pedal edema Abdomen: soft, nondistended, nontender. No mass or organomegaly.  Extremities: warm, without edema, no deformities  Neuro: grossly  intact Psych: appropriate mood and affect, normal insight  Skin: warm and dry   CBC Latest Ref Rng & Units 07/28/2017 07/27/2017 02/27/2016  WBC 4.0 - 10.5 K/uL 16.8(H) 18.2(H) 12.7(H)  Hemoglobin 12.0 - 15.0 g/dL 11.6(L) 14.3 15.5  Hematocrit 36.0 - 46.0 % 34.9(L) 41.4 45.4(H)  Platelets 150 - 400 K/uL 339 398 358    CMP Latest Ref Rng & Units 02/27/2016 09/04/2014 01/20/2013  Glucose 65 - 99 mg/dL 84 98 -  BUN 7 - 25 mg/dL 11 13 15   Creatinine 0.50 - 1.10 mg/dL 1.61 0.96 0.7  Sodium 045 - 146 mmol/L 139 140 142  Potassium 3.5 - 5.3 mmol/L 3.9 3.9 4.1  Chloride 98 - 110 mmol/L 108 110 -  CO2 20 - 31 mmol/L 19(L) 20 -  Calcium 8.6 - 10.2 mg/dL 9.1 8.9 -  Total Protein 6.1 - 8.1 g/dL 6.5 6.3 -  Total Bilirubin 0.2 - 1.2 mg/dL 0.4 0.3 -  Alkaline Phos 33 -  115 U/L 99 89 69  AST 10 - 30 U/L 16 22 16   ALT 6 - 29 U/L 21 36(H) 17    No results found for: INR, PROTIME  Imaging: No results found.  A/P:  MORBID OBESITY (E66.01) Story: She remains an appropriate candidate for sleeve gastrectomy and has completed the preoperative workup. We have previously discussed the surgery including technical aspects, the risks of bleeding, infection, pain, scarring, injury to intra-abdominal structures, staple line leak or abscess, chronic abdominal pain or nausea, new onset or worsened GERD, DVT/PE, pneumonia, heart attack, stroke, death, failure to reach weight loss goals and weight regain, hernia. Discussed the typical peri- and postoperative course. Discussed the importance of lifelong behavioral changes to combat the chronic and relapsing disease which is obesity. She had several insightful questions WERE answered to her satisfaction. Plan to proceed as scheduled with laparoscopic sleeve gastrectomy.   Phylliss Blakes, MD [facility], [place] Pager [phone]

## 2018-07-13 NOTE — Patient Instructions (Signed)
Kendra JockCaitlin B Nolan  07/13/2018   Your procedure is scheduled on: 07-18-18  [facility]  Report to admitting at    0530 AM    Call this number if you have problems the morning of surgery [phone]    Remember:NO SOLID FOOD AFTER MIDNIGHT THE NIGHT PRIOR TO SURGERY. NOTHING BY MOUTH EXCEPT CLEAR LIQUIDS UNTIL 3 HOURS PRIOR TO SCHEULED SURGERY. PLEASE FINISH ENSURE DRINK PER SURGEON ORDER 3 HOURS PRIOR TO SCHEDULED SURGERY TIME WHICH NEEDS TO BE COMPLETED AT __0430 am then nothing by mouth__________.   BRUSH YOUR TEETH MORNING OF SURGERY AND RINSE YOUR MOUTH OUT, NO CHEWING GUM CANDY OR MINTS.     Take these medicines the morning of surgery with A SIP OF WATER: omeprazole                                You may not have any metal on your body including hair pins and              piercings  Do not wear jewelry, make-up, lotions, powders or perfumes, deodorant             Do not wear nail polish.  Do not shave  48 hours prior to surgery.              Do not bring valuables to the hospital. [facility] IS NOT             RESPONSIBLE   FOR VALUABLES.  Contacts, dentures or bridgework may not be worn into surgery.  Leave suitcase in the car. After surgery it may be brought to your room.                Please read over the following fact sheets you were given: _____________________________________________________________________           [facility] - Pre[facility] Before surgery, you can play an important role.  Because skin is not sterile, your skin needs to be as free of germs as possible.  You can reduce the number of germs on your skin by washing with CHG (chlorahexidine gluconate) soap before surgery.  CHG is an antiseptic cleaner which kills germs and bonds with the skin to continue killing germs even after washing. Please DO NOT use if you have an allergy to CHG or antibacterial soaps.  If your skin becomes reddened/irritated  stop using the CHG and inform your nurse when you arrive at Short Stay. Do not shave (including legs and underarms) for at least 48 hours prior to the first CHG shower.  You may shave your face/neck. Please follow these instructions carefully:  1.  Shower with CHG Soap the night before surgery and the  morning of Surgery.  2.  If you choose to wash your hair, wash your hair first as usual with your  normal  shampoo.  3.  After you shampoo, rinse your hair and body thoroughly to remove the  shampoo.                           4.  Use CHG as you would any other liquid soap.  You can apply chg directly  to the skin and wash  Gently with a scrungie or clean washcloth.  5.  Apply the CHG Soap to your body ONLY FROM THE NECK DOWN.   Do not use on face/ open                           Wound or open sores. Avoid contact with eyes, ears mouth and genitals (private parts).                       Wash face,  Genitals (private parts) with your normal soap.             6.  Wash thoroughly, paying special attention to the area where your surgery  will be performed.  7.  Thoroughly rinse your body with warm water from the neck down.  8.  DO NOT shower/wash with your normal soap after using and rinsing off  the CHG Soap.                9.  Pat yourself dry with a clean towel.            10.  Wear clean pajamas.            11.  Place clean sheets on your bed the night of your first shower and do not  sleep with pets. Day of Surgery : Do not apply any lotions/deodorants the morning of surgery.  Please wear clean clothes to the hospital/surgery center.  FAILURE TO FOLLOW THESE INSTRUCTIONS MAY RESULT IN THE CANCELLATION OF YOUR SURGERY PATIENT SIGNATURE_________________________________  NURSE SIGNATURE__________________________________  ________________________________________________________________________  WHAT IS A BLOOD TRANSFUSION? Blood Transfusion Information  A transfusion is the  replacement of blood or some of its parts. Blood is made up of multiple cells which provide different functions.  Red blood cells carry oxygen and are used for blood loss replacement.  White blood cells fight against infection.  Platelets control bleeding.  Plasma helps clot blood.  Other blood products are available for specialized needs, such as hemophilia or other clotting disorders. BEFORE THE TRANSFUSION  Who gives blood for transfusions?   Healthy volunteers who are fully evaluated to make sure their blood is safe. This is blood bank blood. Transfusion therapy is the safest it has ever been in the practice of medicine. Before blood is taken from a donor, a complete history is taken to make sure that person has no history of diseases nor engages in risky social behavior (examples are intravenous drug use or sexual activity with multiple partners). The donor's travel history is screened to minimize risk of transmitting infections, such as malaria. The donated blood is tested for signs of infectious diseases, such as HIV and hepatitis. The blood is then tested to be sure it is compatible with you in order to minimize the chance of a transfusion reaction. If you or a relative donates blood, this is often done in anticipation of surgery and is not appropriate for emergency situations. It takes many days to process the donated blood. RISKS AND COMPLICATIONS Although transfusion therapy is very safe and saves many lives, the main dangers of transfusion include:   Getting an infectious disease.  Developing a transfusion reaction. This is an allergic reaction to something in the blood you were given. Every precaution is taken to prevent this. The decision to have a blood transfusion has been considered carefully by your caregiver before blood is given. Blood is not given unless the benefits outweigh  the risks. AFTER THE TRANSFUSION  Right after receiving a blood transfusion, you will usually  feel much better and more energetic. This is especially true if your red blood cells have gotten low (anemic). The transfusion raises the level of the red blood cells which carry oxygen, and this usually causes an energy increase.  The nurse administering the transfusion will monitor you carefully for complications. HOME CARE INSTRUCTIONS  No special instructions are needed after a transfusion. You may find your energy is better. Speak with your caregiver about any limitations on activity for underlying diseases you may have. SEEK MEDICAL CARE IF:   Your condition is not improving after your transfusion.  You develop redness or irritation at the intravenous (IV) site. SEEK IMMEDIATE MEDICAL CARE IF:  Any of the following symptoms occur over the next 12 hours:  Shaking chills.  You have a temperature by mouth above 102 F (38.9 C), not controlled by medicine.  Chest, back, or muscle pain.  People around you feel you are not acting correctly or are confused.  Shortness of breath or difficulty breathing.  Dizziness and fainting.  You get a rash or develop hives.  You have a decrease in urine output.  Your urine turns a dark color or changes to pink, red, or brown. Any of the following symptoms occur over the next 10 days:  You have a temperature by mouth above 102 F (38.9 C), not controlled by medicine.  Shortness of breath.  Weakness after normal activity.  The white part of the eye turns yellow (jaundice).  You have a decrease in the amount of urine or are urinating less often.  Your urine turns a dark color or changes to pink, red, or brown. Document Released: 07/17/2000 Document Revised: 10/12/2011 Document Reviewed: 03/05/2008 ExitCare Patient Information 2014 [place].  _______________________________________________________________________  Incentive Spirometer  An incentive spirometer is a tool that can help keep your lungs clear and active. This tool  measures how well you are filling your lungs with each breath. Taking long deep breaths may help reverse or decrease the chance of developing breathing (pulmonary) problems (especially infection) following:  A long period of time when you are unable to move or be active. BEFORE THE PROCEDURE   If the spirometer includes an indicator to show your best effort, your nurse or respiratory therapist will set it to a desired goal.  If possible, sit up straight or lean slightly forward. Try not to slouch.  Hold the incentive spirometer in an upright position. INSTRUCTIONS FOR USE  1. Sit on the edge of your bed if possible, or sit up as far as you can in bed or on a chair. 2. Hold the incentive spirometer in an upright position. 3. Breathe out normally. 4. Place the mouthpiece in your mouth and seal your lips tightly around it. 5. Breathe in slowly and as deeply as possible, raising the piston or the ball toward the top of the column. 6. Hold your breath for 3-5 seconds or for as long as possible. Allow the piston or ball to fall to the bottom of the column. 7. Remove the mouthpiece from your mouth and breathe out normally. 8. Rest for a few seconds and repeat Steps 1 through 7 at least 10 times every 1-2 hours when you are awake. Take your time and take a few normal breaths between deep breaths. 9. The spirometer may include an indicator to show your best effort. Use the indicator as a goal to work  toward during each repetition. 10. After each set of 10 deep breaths, practice coughing to be sure your lungs are clear. If you have an incision (the cut made at the time of surgery), support your incision when coughing by placing a pillow or rolled up towels firmly against it. Once you are able to get out of bed, walk around indoors and cough well. You may stop using the incentive spirometer when instructed by your caregiver.  RISKS AND COMPLICATIONS  Take your time so you do not get dizzy or  light-headed.  If you are in pain, you may need to take or ask for pain medication before doing incentive spirometry. It is harder to take a deep breath if you are having pain. AFTER USE  Rest and breathe slowly and easily.  It can be helpful to keep track of a log of your progress. Your caregiver can provide you with a simple table to help with this. If you are using the spirometer at home, follow these instructions: [place] IF:   You are having difficultly using the spirometer.  You have trouble using the spirometer as often as instructed.  Your pain medication is not giving enough relief while using the spirometer.  You develop fever of 100.5 F (38.1 C) or higher. SEEK IMMEDIATE MEDICAL CARE IF:   You cough up bloody sputum that had not been present before.  You develop fever of 102 F (38.9 C) or greater.  You develop worsening pain at or near the incision site. MAKE SURE YOU:   Understand these instructions.  Will watch your condition.  Will get help right away if you are not doing well or get worse. Document Released: 11/30/2006 Document Revised: 10/12/2011 Document Reviewed: 01/31/2007 [facility] Patient Information 2014 [place], [place].   ________________________________________________________________________

## 2018-07-13 NOTE — H&P (Signed)
Surgical H&P  CC: morbid obesity  HPI:She returns today for her preoperative appointment, surgery is this Monday the 16th. She has completed the preoperative workup with no obstacles identified.  She has some insightful questions today that she would like to discuss.  Her weight today is down to 286 pounds, BMI 51.48.  Psych-Dr. Lurey 03/22/18- approved, she scored 3 standard deviations above the mean for diffuse emotional eating, specific emotional eating and external food Q eating on the duct behaviors questionnaire and therefore is recommended for further follow-up to address this she scored 2 standard deviations below the mean for use of cognitive restraint rules and regulations eating.  She also scored fairly high on the back depression inventory, some scored significantly high on the symptoms checklist 90 including aspirin hostility and paranoid ideation, reflecting a tendency to suppress rather than express complaints regarding any psychologic or physical accident and she may experience. Nutrition- approved  CXR/UGI 03/03/18- mild esophageal dysmotility, no hiatal hernia or reflux Labs-  Initial visit 02/10/18: Otherwise relatively healthy young woman is here today to discuss bariatric surgery. She has been overweight essentially since starting high school but her weight had been steady and the 260s to 80s for many years until she recently had a baby and then the postpartum weight has been difficult to lose. She has several family members who have had either gastric bypass or sleeve gastrectomy and has seen good results. Her main goal for pursuing bariatric surgery is to be healthier so that she can be there for her daughter who is now about 7 months old. In the past she has tried various diets and exercise programs, phentermine, supervised weight loss programs, and may lose a few pounds but then ultimately gained back. Her current lifestyle she states is fairly high carb. She does consume  limited sodas and tea. She does not exercise currently. She does have some reflux which is relieved with over-the-counter daily medication. She is not a smoker. 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 Initial date weight 302.25lb/ BMI 54.4  Allergies  Allergen Reactions  . Codeine Hives    Codeine cough syrup  . Clarithromycin Hives  . Penicillins Hives    Has patient had a PCN reaction causing immediate rash, facial/tongue/throat swelling, SOB or lightheadedness with hypotension: Yes Has patient had a PCN reaction causing severe rash involving mucus membranes or skin necrosis: Yes Has patient had a PCN reaction that required hospitalization: No Has patient had a PCN reaction occurring within the last 10 years: No If all of the above answers are "NO", then may proceed with Cephalosporin use.     Past Medical History:  Diagnosis Date  . Migraine     Past Surgical History:  Procedure Laterality Date  . TONSILLECTOMY    . TONSILLECTOMY AND ADENOIDECTOMY      Family History  Problem Relation Age of Onset  . Cancer Maternal Aunt   . Hypertension Mother   . Hyperlipidemia Father   . Hyperlipidemia Maternal Grandmother   . Hyperlipidemia Maternal Grandfather   . Hypertension Maternal Grandfather   . Hyperlipidemia Paternal Grandmother   . Hyperlipidemia Paternal Grandfather   . Asthma Other   . COPD Other     Social History   Socioeconomic History  . Marital status: Married    Spouse name: Not on file  . Number of children: Not   on file  . Years of education: Not on file  . Highest education level: Not on file  Occupational History  . Not on file  Social Needs  . Financial resource strain: Not on file  . Food insecurity:    Worry: Not on file    Inability: Not on file  . Transportation needs:    Medical: Not on file    Non-medical: Not on file   Tobacco Use  . Smoking status: Never Smoker  . Smokeless tobacco: Never Used  Substance and Sexual Activity  . Alcohol use: No  . Drug use: No  . Sexual activity: Never  Lifestyle  . Physical activity:    Days per week: Not on file    Minutes per session: Not on file  . Stress: Not on file  Relationships  . Social connections:    Talks on phone: Not on file    Gets together: Not on file    Attends religious service: Not on file    Active member of club or organization: Not on file    Attends meetings of clubs or organizations: Not on file    Relationship status: Not on file  Other Topics Concern  . Not on file  Social History Narrative  . Not on file    Current Outpatient Medications on File Prior to Visit  Medication Sig Dispense Refill  . esomeprazole (NEXIUM) 40 MG capsule One tab by mouth BID (Patient not taking: Reported on 07/08/2018) 60 capsule 3  . Multiple Vitamin (MULTIVITAMIN WITH MINERALS) TABS tablet Take 1 tablet by mouth daily.    . omeprazole (PRILOSEC) 20 MG capsule Take 20 mg by mouth daily.    . TAYTULLA 1-20 MG-MCG(24) CAPS Take 1 tablet by mouth daily.  11  . venlafaxine XR (EFFEXOR-XR) 150 MG 24 hr capsule TAKE 1 CAPSULE (150 MG TOTAL) BY MOUTH AT BEDTIME. 90 capsule 0   No current facility-administered medications on file prior to visit.     Review of Systems: a complete, 10pt review of systems was completed with pertinent positives and negatives as documented in the HPI  Physical Exam: There were no vitals filed for this visit. Gen: A&Ox3, no distress  Head: normocephalic, atraumatic Eyes: extraocular motions intact, anicteric.  Neck: supple without mass or thyromegaly Chest: unlabored respirations, symmetrical air entry, clear bilaterally   Cardiovascular: RRR with palpable distal pulses, no pedal edema Abdomen: soft, nondistended, nontender. No mass or organomegaly.  Extremities: warm, without edema, no deformities  Neuro: grossly  intact Psych: appropriate mood and affect, normal insight  Skin: warm and dry   CBC Latest Ref Rng & Units 07/28/2017 07/27/2017 02/27/2016  WBC 4.0 - 10.5 K/uL 16.8(H) 18.2(H) 12.7(H)  Hemoglobin 12.0 - 15.0 g/dL 11.6(L) 14.3 15.5  Hematocrit 36.0 - 46.0 % 34.9(L) 41.4 45.4(H)  Platelets 150 - 400 K/uL 339 398 358    CMP Latest Ref Rng & Units 02/27/2016 09/04/2014 01/20/2013  Glucose 65 - 99 mg/dL 84 98 -  BUN 7 - 25 mg/dL 11 13 15  Creatinine 0.50 - 1.10 mg/dL 0.79 0.66 0.7  Sodium 135 - 146 mmol/L 139 140 142  Potassium 3.5 - 5.3 mmol/L 3.9 3.9 4.1  Chloride 98 - 110 mmol/L 108 110 -  CO2 20 - 31 mmol/L 19(L) 20 -  Calcium 8.6 - 10.2 mg/dL 9.1 8.9 -  Total Protein 6.1 - 8.1 g/dL 6.5 6.3 -  Total Bilirubin 0.2 - 1.2 mg/dL 0.4 0.3 -  Alkaline Phos 33 -   115 U/L 99 89 69  AST 10 - 30 U/L 16 22 16  ALT 6 - 29 U/L 21 36(H) 17    No results found for: INR, PROTIME  Imaging: No results found.  A/P:  MORBID OBESITY (E66.01) Story: She remains an appropriate candidate for sleeve gastrectomy and has completed the preoperative workup. We have previously discussed the surgery including technical aspects, the risks of bleeding, infection, pain, scarring, injury to intra-abdominal structures, staple line leak or abscess, chronic abdominal pain or nausea, new onset or worsened GERD, DVT/PE, pneumonia, heart attack, stroke, death, failure to reach weight loss goals and weight regain, hernia. Discussed the typical peri- and postoperative course. Discussed the importance of lifelong behavioral changes to combat the chronic and relapsing disease which is obesity. She had several insightful questions WERE answered to her satisfaction. Plan to proceed as scheduled with laparoscopic sleeve gastrectomy.   Philo Kurtz, MD [facility] Pager [phone]   

## 2018-07-14 ENCOUNTER — Other Ambulatory Visit: Payer: Self-pay

## 2018-07-14 ENCOUNTER — Encounter (HOSPITAL_COMMUNITY)
Admission: RE | Admit: 2018-07-14 | Discharge: 2018-07-14 | Disposition: A | Discharge status: Home / Self Care | Payer: BLUE CROSS/BLUE SHIELD | Source: Ambulatory Visit | Attending: Surgery | Admitting: Surgery

## 2018-07-14 ENCOUNTER — Encounter (HOSPITAL_COMMUNITY): Payer: Self-pay

## 2018-07-14 DIAGNOSIS — Z01812 Encounter for preprocedural laboratory examination: Secondary | ICD-10-CM | POA: Insufficient documentation

## 2018-07-14 DIAGNOSIS — Z6841 Body Mass Index (BMI) 40.0 and over, adult: Secondary | ICD-10-CM | POA: Diagnosis not present

## 2018-07-14 HISTORY — DX: Gastro-esophageal reflux disease without esophagitis: K21.9

## 2018-07-14 LAB — CBC WITH DIFFERENTIAL/PLATELET
Abs Immature Granulocytes: 0.03 10*3/uL (ref 0.00–0.07)
Basophils Absolute: 0 10*3/uL (ref 0.0–0.1)
Basophils Relative: 0 %
Eosinophils Absolute: 0.2 10*3/uL (ref 0.0–0.5)
Eosinophils Relative: 2 %
HCT: 44.2 % (ref 36.0–46.0)
Hemoglobin: 14.5 g/dL (ref 12.0–15.0)
Immature Granulocytes: 0 %
Lymphocytes Relative: 30 %
Lymphs Abs: 2.8 10*3/uL (ref 0.7–4.0)
MCH: 30.9 pg (ref 26.0–34.0)
MCHC: 32.8 g/dL (ref 30.0–36.0)
MCV: 94 fL (ref 80.0–100.0)
Monocytes Absolute: 0.9 10*3/uL (ref 0.1–1.0)
Monocytes Relative: 9 %
Neutro Abs: 5.3 10*3/uL (ref 1.7–7.7)
Neutrophils Relative %: 59 %
Platelets: 342 10*3/uL (ref 150–400)
RBC: 4.7 MIL/uL (ref 3.87–5.11)
RDW: 12.6 % (ref 11.5–15.5)
WBC: 9.2 10*3/uL (ref 4.0–10.5)
nRBC: 0 % (ref 0.0–0.2)

## 2018-07-14 LAB — COMPREHENSIVE METABOLIC PANEL
ALT: 65 U/L — ABNORMAL HIGH (ref 0–44)
AST: 47 U/L — ABNORMAL HIGH (ref 15–41)
Albumin: 4.1 g/dL (ref 3.5–5.0)
Alkaline Phosphatase: 91 U/L (ref 38–126)
Anion gap: 9 (ref 5–15)
BUN: 13 mg/dL (ref 6–20)
CO2: 25 mmol/L (ref 22–32)
Calcium: 9.1 mg/dL (ref 8.9–10.3)
Chloride: 104 mmol/L (ref 98–111)
Creatinine, Ser: 0.82 mg/dL (ref 0.44–1.00)
GFR calc Af Amer: >60 mL/min (ref >60)
GFR calc non Af Amer: >60 mL/min (ref >60)
Glucose, Bld: 85 mg/dL (ref 70–99)
Potassium: 3.8 mmol/L (ref 3.5–5.1)
Sodium: 138 mmol/L (ref 135–145)
Total Bilirubin: 0.6 mg/dL (ref 0.3–1.2)
Total Protein: 7.2 g/dL (ref 6.5–8.1)

## 2018-07-15 LAB — ABO/RH: ABO/RH(D): A POS

## 2018-07-17 MED ORDER — BUPIVACAINE LIPOSOME 1.3 % IJ SUSP
20.0000 mL | Freq: Once | INTRAMUSCULAR | Status: DC
  Filled 2018-07-17: qty 20

## 2018-07-18 ENCOUNTER — Encounter (HOSPITAL_COMMUNITY)
Admission: RE | Disposition: A | Discharge status: Home / Self Care | Payer: Self-pay | Source: Home / Self Care | Attending: Surgery

## 2018-07-18 ENCOUNTER — Inpatient Hospital Stay (HOSPITAL_COMMUNITY)
Admission: RE | Admit: 2018-07-18 | Discharge: 2018-07-19 | DRG: 621 | Disposition: A | Discharge status: Home / Self Care | Payer: BLUE CROSS/BLUE SHIELD | Attending: Surgery | Admitting: Surgery

## 2018-07-18 ENCOUNTER — Encounter (HOSPITAL_COMMUNITY): Payer: Self-pay | Admitting: *Deleted

## 2018-07-18 ENCOUNTER — Inpatient Hospital Stay (HOSPITAL_COMMUNITY): Payer: BLUE CROSS/BLUE SHIELD | Admitting: Certified Registered Nurse Anesthetist

## 2018-07-18 DIAGNOSIS — Z6841 Body Mass Index (BMI) 40.0 and over, adult: Secondary | ICD-10-CM

## 2018-07-18 DIAGNOSIS — Z825 Family history of asthma and other chronic lower respiratory diseases: Secondary | ICD-10-CM

## 2018-07-18 DIAGNOSIS — Z880 Allergy status to penicillin: Secondary | ICD-10-CM

## 2018-07-18 DIAGNOSIS — Z8349 Family history of other endocrine, nutritional and metabolic diseases: Secondary | ICD-10-CM

## 2018-07-18 DIAGNOSIS — Z885 Allergy status to narcotic agent status: Secondary | ICD-10-CM

## 2018-07-18 DIAGNOSIS — Z8249 Family history of ischemic heart disease and other diseases of the circulatory system: Secondary | ICD-10-CM

## 2018-07-18 DIAGNOSIS — Z79899 Other long term (current) drug therapy: Secondary | ICD-10-CM | POA: Diagnosis not present

## 2018-07-18 DIAGNOSIS — K224 Dyskinesia of esophagus: Secondary | ICD-10-CM | POA: Diagnosis present

## 2018-07-18 DIAGNOSIS — Z881 Allergy status to other antibiotic agents status: Secondary | ICD-10-CM | POA: Diagnosis not present

## 2018-07-18 DIAGNOSIS — F329 Major depressive disorder, single episode, unspecified: Secondary | ICD-10-CM | POA: Diagnosis present

## 2018-07-18 DIAGNOSIS — K219 Gastro-esophageal reflux disease without esophagitis: Secondary | ICD-10-CM | POA: Diagnosis present

## 2018-07-18 HISTORY — PX: LAPAROSCOPIC GASTRIC SLEEVE RESECTION: SHX5895

## 2018-07-18 LAB — TYPE AND SCREEN
ABO/RH(D): A POS
Antibody Screen: NEGATIVE

## 2018-07-18 SURGERY — GASTRECTOMY, SLEEVE, LAPAROSCOPIC
Anesthesia: General | Site: Abdomen

## 2018-07-18 MED ORDER — MIDAZOLAM HCL 2 MG/2ML IJ SOLN
INTRAMUSCULAR | Status: AC
  Filled 2018-07-18: qty 2

## 2018-07-18 MED ORDER — LIDOCAINE HCL 2 % IJ SOLN
INTRAMUSCULAR | Status: AC
  Filled 2018-07-18: qty 20

## 2018-07-18 MED ORDER — LACTATED RINGERS IV SOLN
INTRAVENOUS | Status: DC
  Administered 2018-07-18 (×2): via INTRAVENOUS

## 2018-07-18 MED ORDER — CHLORHEXIDINE GLUCONATE 4 % EX LIQD: 60.0000 mL | Freq: Once | CUTANEOUS | Status: DC

## 2018-07-18 MED ORDER — OXYCODONE HCL 5 MG PO TABS: 5.0000 mg | ORAL_TABLET | Freq: Once | ORAL | Status: DC | PRN

## 2018-07-18 MED ORDER — ACETAMINOPHEN 500 MG PO TABS
1000.0000 mg | ORAL_TABLET | ORAL | Status: AC
  Administered 2018-07-18: 1000 mg via ORAL
  Filled 2018-07-18: qty 2

## 2018-07-18 MED ORDER — HYDROMORPHONE HCL 1 MG/ML IJ SOLN: 0.5000 mg | Freq: Four times a day (QID) | INTRAMUSCULAR | Status: DC | PRN

## 2018-07-18 MED ORDER — ONDANSETRON HCL 4 MG/2ML IJ SOLN
INTRAMUSCULAR | Status: AC
  Filled 2018-07-18: qty 2

## 2018-07-18 MED ORDER — ALBUTEROL SULFATE HFA 108 (90 BASE) MCG/ACT IN AERS
INHALATION_SPRAY | RESPIRATORY_TRACT | Status: AC
  Filled 2018-07-18: qty 6.7

## 2018-07-18 MED ORDER — FENTANYL CITRATE (PF) 250 MCG/5ML IJ SOLN
INTRAMUSCULAR | Status: DC | PRN
  Administered 2018-07-18 (×3): 50 ug via INTRAVENOUS

## 2018-07-18 MED ORDER — EPHEDRINE SULFATE-NACL 50-0.9 MG/10ML-% IV SOSY
PREFILLED_SYRINGE | INTRAVENOUS | Status: DC | PRN
  Administered 2018-07-18: 10 mg via INTRAVENOUS

## 2018-07-18 MED ORDER — LIDOCAINE 2% (20 MG/ML) 5 ML SYRINGE
INTRAMUSCULAR | Status: DC | PRN
  Administered 2018-07-18: 100 mg via INTRAVENOUS

## 2018-07-18 MED ORDER — BUPIVACAINE-EPINEPHRINE (PF) 0.25% -1:200000 IJ SOLN
INTRAMUSCULAR | Status: AC
  Filled 2018-07-18: qty 30

## 2018-07-18 MED ORDER — LIDOCAINE 2% (20 MG/ML) 5 ML SYRINGE
INTRAMUSCULAR | Status: AC
  Filled 2018-07-18: qty 5

## 2018-07-18 MED ORDER — PROPOFOL 10 MG/ML IV BOLUS
INTRAVENOUS | Status: DC | PRN
  Administered 2018-07-18: 200 mg via INTRAVENOUS

## 2018-07-18 MED ORDER — MIDAZOLAM HCL 5 MG/5ML IJ SOLN
INTRAMUSCULAR | Status: DC | PRN
  Administered 2018-07-18: 2 mg via INTRAVENOUS

## 2018-07-18 MED ORDER — METOCLOPRAMIDE HCL 5 MG/ML IJ SOLN: 10.0000 mg | Freq: Four times a day (QID) | INTRAMUSCULAR | Status: DC | PRN

## 2018-07-18 MED ORDER — LACTATED RINGERS IR SOLN
Status: DC | PRN
  Administered 2018-07-18: 1000 mL

## 2018-07-18 MED ORDER — SIMETHICONE 80 MG PO CHEW: 80.0000 mg | CHEWABLE_TABLET | Freq: Four times a day (QID) | ORAL | Status: DC | PRN

## 2018-07-18 MED ORDER — LIDOCAINE 2% (20 MG/ML) 5 ML SYRINGE
INTRAMUSCULAR | Status: DC | PRN
  Administered 2018-07-18: 1.5 mg/kg/h via INTRAVENOUS

## 2018-07-18 MED ORDER — ALBUTEROL SULFATE HFA 108 (90 BASE) MCG/ACT IN AERS
INHALATION_SPRAY | RESPIRATORY_TRACT | Status: DC | PRN
  Administered 2018-07-18: 5 via RESPIRATORY_TRACT

## 2018-07-18 MED ORDER — PROPOFOL 10 MG/ML IV BOLUS
INTRAVENOUS | Status: AC
  Filled 2018-07-18: qty 40

## 2018-07-18 MED ORDER — DEXAMETHASONE SODIUM PHOSPHATE 10 MG/ML IJ SOLN
INTRAMUSCULAR | Status: AC
  Filled 2018-07-18: qty 1

## 2018-07-18 MED ORDER — SODIUM CHLORIDE 0.9 % IV SOLN
INTRAVENOUS | Status: DC
  Administered 2018-07-18 – 2018-07-19 (×3): via INTRAVENOUS

## 2018-07-18 MED ORDER — METHOCARBAMOL 1000 MG/10ML IJ SOLN
500.0000 mg | Freq: Four times a day (QID) | INTRAVENOUS | Status: DC | PRN
  Filled 2018-07-18: qty 5

## 2018-07-18 MED ORDER — FENTANYL CITRATE (PF) 100 MCG/2ML IJ SOLN: 25.0000 ug | INTRAMUSCULAR | Status: DC | PRN

## 2018-07-18 MED ORDER — PANTOPRAZOLE SODIUM 40 MG IV SOLR
40.0000 mg | Freq: Every day | INTRAVENOUS | Status: DC
  Administered 2018-07-18: 40 mg via INTRAVENOUS
  Filled 2018-07-18: qty 40

## 2018-07-18 MED ORDER — SUGAMMADEX SODIUM 500 MG/5ML IV SOLN
INTRAVENOUS | Status: AC
  Filled 2018-07-18: qty 5

## 2018-07-18 MED ORDER — TRAMADOL HCL 50 MG PO TABS: 50.0000 mg | ORAL_TABLET | Freq: Four times a day (QID) | ORAL | Status: DC | PRN

## 2018-07-18 MED ORDER — OXYCODONE HCL 5 MG/5ML PO SOLN
5.0000 mg | Freq: Once | ORAL | Status: DC | PRN
  Filled 2018-07-18: qty 5

## 2018-07-18 MED ORDER — PREMIER PROTEIN SHAKE: 2.0000 [oz_av] | ORAL | Status: DC

## 2018-07-18 MED ORDER — DIPHENHYDRAMINE HCL 12.5 MG/5ML PO ELIX: 25.0000 mg | ORAL_SOLUTION | Freq: Four times a day (QID) | ORAL | Status: DC | PRN

## 2018-07-18 MED ORDER — ENOXAPARIN SODIUM 30 MG/0.3ML ~~LOC~~ SOLN
30.0000 mg | Freq: Two times a day (BID) | SUBCUTANEOUS | Status: DC
  Administered 2018-07-18 – 2018-07-19 (×2): 30 mg via SUBCUTANEOUS
  Filled 2018-07-18 (×2): qty 0.3

## 2018-07-18 MED ORDER — ONDANSETRON HCL 4 MG/2ML IJ SOLN
INTRAMUSCULAR | Status: DC | PRN
  Administered 2018-07-18: 4 mg via INTRAVENOUS

## 2018-07-18 MED ORDER — ACETAMINOPHEN 160 MG/5ML PO SOLN
650.0000 mg | Freq: Four times a day (QID) | ORAL | Status: DC
  Administered 2018-07-19 (×2): 650 mg via ORAL
  Filled 2018-07-18 (×2): qty 20.3

## 2018-07-18 MED ORDER — CELECOXIB 200 MG PO CAPS
400.0000 mg | ORAL_CAPSULE | ORAL | Status: AC
  Administered 2018-07-18: 400 mg via ORAL
  Filled 2018-07-18: qty 2

## 2018-07-18 MED ORDER — FENTANYL CITRATE (PF) 250 MCG/5ML IJ SOLN
INTRAMUSCULAR | Status: AC
  Filled 2018-07-18: qty 5

## 2018-07-18 MED ORDER — SUGAMMADEX SODIUM 200 MG/2ML IV SOLN
INTRAVENOUS | Status: DC | PRN
  Administered 2018-07-18: 300 mg via INTRAVENOUS

## 2018-07-18 MED ORDER — KETAMINE HCL 10 MG/ML IJ SOLN
INTRAMUSCULAR | Status: DC | PRN
  Administered 2018-07-18: 25 mg via INTRAVENOUS

## 2018-07-18 MED ORDER — ONDANSETRON HCL 4 MG/2ML IJ SOLN
4.0000 mg | INTRAMUSCULAR | Status: DC | PRN
  Administered 2018-07-18 (×2): 4 mg via INTRAVENOUS
  Filled 2018-07-18 (×2): qty 2

## 2018-07-18 MED ORDER — BUPIVACAINE LIPOSOME 1.3 % IJ SUSP
INTRAMUSCULAR | Status: DC | PRN
  Administered 2018-07-18: 20 mL

## 2018-07-18 MED ORDER — DEXAMETHASONE SODIUM PHOSPHATE 4 MG/ML IJ SOLN: 4.0000 mg | INTRAMUSCULAR | Status: DC

## 2018-07-18 MED ORDER — OXYCODONE HCL 5 MG/5ML PO SOLN: 5.0000 mg | ORAL | Status: DC | PRN

## 2018-07-18 MED ORDER — HYDRALAZINE HCL 20 MG/ML IJ SOLN: 10.0000 mg | INTRAMUSCULAR | Status: DC | PRN

## 2018-07-18 MED ORDER — KETAMINE HCL 10 MG/ML IJ SOLN
INTRAMUSCULAR | Status: AC
  Filled 2018-07-18: qty 1

## 2018-07-18 MED ORDER — DEXAMETHASONE SODIUM PHOSPHATE 10 MG/ML IJ SOLN
INTRAMUSCULAR | Status: DC | PRN
  Administered 2018-07-18: 5 mg via INTRAVENOUS

## 2018-07-18 MED ORDER — EPHEDRINE 5 MG/ML INJ
INTRAVENOUS | Status: AC
  Filled 2018-07-18: qty 10

## 2018-07-18 MED ORDER — ROCURONIUM BROMIDE 50 MG/5ML IV SOSY
PREFILLED_SYRINGE | INTRAVENOUS | Status: DC | PRN
  Administered 2018-07-18: 60 mg via INTRAVENOUS

## 2018-07-18 MED ORDER — SCOPOLAMINE 1 MG/3DAYS TD PT72
1.0000 | MEDICATED_PATCH | TRANSDERMAL | Status: DC
  Administered 2018-07-18: 1.5 mg via TRANSDERMAL
  Filled 2018-07-18: qty 1

## 2018-07-18 MED ORDER — PHENYLEPHRINE 40 MCG/ML (10ML) SYRINGE FOR IV PUSH (FOR BLOOD PRESSURE SUPPORT)
PREFILLED_SYRINGE | INTRAVENOUS | Status: DC | PRN
  Administered 2018-07-18: 120 ug via INTRAVENOUS

## 2018-07-18 MED ORDER — SODIUM CHLORIDE 0.9 % IV SOLN
2.0000 g | INTRAVENOUS | Status: AC
  Administered 2018-07-18: 2 g via INTRAVENOUS
  Filled 2018-07-18: qty 2

## 2018-07-18 MED ORDER — ROCURONIUM BROMIDE 10 MG/ML (PF) SYRINGE
PREFILLED_SYRINGE | INTRAVENOUS | Status: AC
  Filled 2018-07-18: qty 10

## 2018-07-18 MED ORDER — DOCUSATE SODIUM 100 MG PO CAPS
100.0000 mg | ORAL_CAPSULE | Freq: Two times a day (BID) | ORAL | Status: DC
  Administered 2018-07-19: 100 mg via ORAL
  Filled 2018-07-18 (×2): qty 1

## 2018-07-18 MED ORDER — METOPROLOL TARTRATE 5 MG/5ML IV SOLN: 5.0000 mg | Freq: Four times a day (QID) | INTRAVENOUS | Status: DC | PRN

## 2018-07-18 MED ORDER — PHENYLEPHRINE 40 MCG/ML (10ML) SYRINGE FOR IV PUSH (FOR BLOOD PRESSURE SUPPORT)
PREFILLED_SYRINGE | INTRAVENOUS | Status: AC
  Filled 2018-07-18: qty 10

## 2018-07-18 MED ORDER — SUCCINYLCHOLINE CHLORIDE 200 MG/10ML IV SOSY
PREFILLED_SYRINGE | INTRAVENOUS | Status: AC
  Filled 2018-07-18: qty 10

## 2018-07-18 MED ORDER — VENLAFAXINE HCL ER 150 MG PO CP24
150.0000 mg | ORAL_CAPSULE | Freq: Every day | ORAL | Status: DC
  Administered 2018-07-18: 150 mg via ORAL
  Filled 2018-07-18 (×2): qty 1

## 2018-07-18 MED ORDER — APREPITANT 40 MG PO CAPS
40.0000 mg | ORAL_CAPSULE | ORAL | Status: AC
  Administered 2018-07-18: 40 mg via ORAL
  Filled 2018-07-18: qty 1

## 2018-07-18 MED ORDER — BUPIVACAINE-EPINEPHRINE 0.25% -1:200000 IJ SOLN
INTRAMUSCULAR | Status: DC | PRN
  Administered 2018-07-18: 30 mL

## 2018-07-18 MED ORDER — GABAPENTIN 100 MG PO CAPS
200.0000 mg | ORAL_CAPSULE | Freq: Two times a day (BID) | ORAL | Status: DC
  Administered 2018-07-19: 200 mg via ORAL
  Filled 2018-07-18 (×3): qty 2

## 2018-07-18 MED ORDER — SUCCINYLCHOLINE CHLORIDE 200 MG/10ML IV SOSY
PREFILLED_SYRINGE | INTRAVENOUS | Status: DC | PRN
  Administered 2018-07-18: 120 mg via INTRAVENOUS

## 2018-07-18 MED ORDER — ENOXAPARIN SODIUM 40 MG/0.4ML ~~LOC~~ SOLN
40.0000 mg | SUBCUTANEOUS | Status: AC
  Administered 2018-07-18: 40 mg via SUBCUTANEOUS
  Filled 2018-07-18: qty 0.4

## 2018-07-18 MED ORDER — GABAPENTIN 300 MG PO CAPS
300.0000 mg | ORAL_CAPSULE | ORAL | Status: AC
  Administered 2018-07-18: 300 mg via ORAL
  Filled 2018-07-18: qty 1

## 2018-07-18 MED ORDER — ONDANSETRON HCL 4 MG/2ML IJ SOLN
4.0000 mg | Freq: Four times a day (QID) | INTRAMUSCULAR | Status: AC | PRN
  Administered 2018-07-18: 4 mg via INTRAVENOUS

## 2018-07-18 SURGICAL SUPPLY — 67 items
APPLICATOR COTTON TIP 6 STRL (MISCELLANEOUS) IMPLANT
APPLICATOR COTTON TIP 6IN STRL (MISCELLANEOUS)
APPLIER CLIP ROT 10 11.4 M/L (STAPLE)
APPLIER CLIP ROT 13.4 12 LRG (CLIP)
BAG LAPAROSCOPIC 12 15 PORT 16 (BASKET) IMPLANT
BAG RETRIEVAL 12/15 (BASKET)
BAG RETRIEVAL 12/15MM (BASKET)
BANDAGE ADH SHEER 1  50/CT (GAUZE/BANDAGES/DRESSINGS) ×18 IMPLANT
BENZOIN TINCTURE PRP APPL 2/3 (GAUZE/BANDAGES/DRESSINGS) ×3 IMPLANT
BLADE SURG SZ11 CARB STEEL (BLADE) ×3 IMPLANT
CABLE HIGH FREQUENCY MONO STRZ (ELECTRODE) IMPLANT
CHLORAPREP W/TINT 26ML (MISCELLANEOUS) ×6 IMPLANT
CLIP APPLIE ROT 10 11.4 M/L (STAPLE) IMPLANT
CLIP APPLIE ROT 13.4 12 LRG (CLIP) IMPLANT
CLOSURE WOUND 1/2 X4 (GAUZE/BANDAGES/DRESSINGS) ×1
COVER SURGICAL LIGHT HANDLE (MISCELLANEOUS) ×3 IMPLANT
COVER WAND RF STERILE (DRAPES) ×3 IMPLANT
DECANTER SPIKE VIAL GLASS SM (MISCELLANEOUS) IMPLANT
DEVICE SUT QUICK LOAD TK 5 (STAPLE) IMPLANT
DEVICE SUT TI-KNOT TK 5X26 (MISCELLANEOUS) IMPLANT
DEVICE TI KNOT TK5 (MISCELLANEOUS)
DRAPE UTILITY XL STRL (DRAPES) ×6 IMPLANT
ELECT REM PT RETURN 15FT ADLT (MISCELLANEOUS) ×3 IMPLANT
GAUZE SPONGE 4X4 12PLY STRL (GAUZE/BANDAGES/DRESSINGS) IMPLANT
GLOVE BIO SURGEON STRL SZ 6 (GLOVE) ×3 IMPLANT
GLOVE INDICATOR 6.5 STRL GRN (GLOVE) ×3 IMPLANT
GOWN STRL REUS W/TWL LRG LVL3 (GOWN DISPOSABLE) ×9 IMPLANT
GOWN STRL REUS W/TWL XL LVL3 (GOWN DISPOSABLE) ×3 IMPLANT
GRASPER SUT TROCAR 14GX15 (MISCELLANEOUS) ×3 IMPLANT
HOVERMATT SINGLE USE (MISCELLANEOUS) ×3 IMPLANT
KIT BASIN OR (CUSTOM PROCEDURE TRAY) ×3 IMPLANT
MARKER SKIN DUAL TIP RULER LAB (MISCELLANEOUS) ×3 IMPLANT
NEEDLE SPNL 22GX3.5 QUINCKE BK (NEEDLE) ×3 IMPLANT
PACK UNIVERSAL I (CUSTOM PROCEDURE TRAY) ×3 IMPLANT
QUICK LOAD TK 5 (STAPLE)
RELOAD ENDO STITCH (ENDOMECHANICALS) IMPLANT
RELOAD STAPLER BLUE 60MM (STAPLE) ×3 IMPLANT
RELOAD STAPLER GOLD 60MM (STAPLE) ×1 IMPLANT
RELOAD STAPLER GREEN 60MM (STAPLE) ×2 IMPLANT
SCISSORS LAP 5X45 EPIX DISP (ENDOMECHANICALS) IMPLANT
SET IRRIG TUBING LAPAROSCOPIC (IRRIGATION / IRRIGATOR) ×3 IMPLANT
SHEARS HARMONIC ACE PLUS 45CM (MISCELLANEOUS) ×3 IMPLANT
SLEEVE ADV FIXATION 5X100MM (TROCAR) ×6 IMPLANT
SLEEVE GASTRECTOMY 40FR VISIGI (MISCELLANEOUS) ×3 IMPLANT
SOLUTION ANTI FOG 6CC (MISCELLANEOUS) ×3 IMPLANT
SPONGE LAP 18X18 RF (DISPOSABLE) ×3 IMPLANT
STAPLER ECHELON BIOABSB 60 FLE (MISCELLANEOUS) ×15 IMPLANT
STAPLER ECHELON LONG 60 440 (INSTRUMENTS) ×3 IMPLANT
STAPLER RELOAD BLUE 60MM (STAPLE) ×9
STAPLER RELOAD GOLD 60MM (STAPLE) ×3
STAPLER RELOAD GREEN 60MM (STAPLE) ×6
STRIP CLOSURE SKIN 1/2X4 (GAUZE/BANDAGES/DRESSINGS) ×2 IMPLANT
SUT MNCRL AB 4-0 PS2 18 (SUTURE) ×3 IMPLANT
SUT SURGIDAC NAB ES-9 0 48 120 (SUTURE) IMPLANT
SUT VICRYL 0 TIES 12 18 (SUTURE) ×3 IMPLANT
SYR 10ML ECCENTRIC (SYRINGE) ×3 IMPLANT
SYR 20CC LL (SYRINGE) ×3 IMPLANT
SYR 50ML LL SCALE MARK (SYRINGE) ×3 IMPLANT
TOWEL OR 17X26 10 PK STRL BLUE (TOWEL DISPOSABLE) ×3 IMPLANT
TOWEL OR NON WOVEN STRL DISP B (DISPOSABLE) ×3 IMPLANT
TROCAR ADV FIXATION 5X100MM (TROCAR) ×3 IMPLANT
TROCAR BLADELESS 15MM (ENDOMECHANICALS) ×3 IMPLANT
TROCAR BLADELESS OPT 5 100 (ENDOMECHANICALS) ×3 IMPLANT
TUBING CONNECTING 10 (TUBING) ×2 IMPLANT
TUBING CONNECTING 10' (TUBING) ×1
TUBING ENDO SMARTCAP (MISCELLANEOUS) ×3 IMPLANT
TUBING INSUF HEATED (TUBING) ×3 IMPLANT

## 2018-07-18 NOTE — Progress Notes (Signed)
Patient alert and oriented.  Mother at bedside.  Has ambulated, demonstrated IS, and started fluids.  Questions answered.

## 2018-07-18 NOTE — Discharge Instructions (Signed)
° ° ° °GASTRIC BYPASS/SLEEVE ° Home Care Instructions ° ° These instructions are to help you care for yourself when you go home. ° °Call: If you have any problems. °• Call [phone] and ask for the surgeon on call °• If you need immediate help, come to the ER at [facility].  °• Tell the ER staff that you are a new post-op gastric bypass or gastric sleeve patient °  °Signs and symptoms to report: • Severe vomiting or nausea °o If you cannot keep down clear liquids for longer than 1 day, call your surgeon  °• Abdominal pain that does not get better after taking your pain medication °• Fever over 100.4° F with chills °• Heart beating over 100 beats a minute °• Shortness of breath at rest °• Chest pain °•  Redness, swelling, drainage, or foul odor at incision (surgical) sites °•  If your incisions open or pull apart °• Swelling or pain in calf (lower leg) °• Diarrhea (Loose bowel movements that happen often), frequent watery, uncontrolled bowel movements °• Constipation, (no bowel movements for 3 days) if this happens: Pick one °o Milk of Magnesia, 2 tablespoons by mouth, 3 times a day for 2 days if needed °o Stop taking Milk of Magnesia once you have a bowel movement °o Call your doctor if constipation continues °Or °o Miralax  (instead of Milk of Magnesia) following the label instructions °o Stop taking Miralax once you have a bowel movement °o Call your doctor if constipation continues °• Anything you think is not normal °  °Normal side effects after surgery: • Unable to sleep at night or unable to focus °• Irritability or moody °• Being tearful (crying) or depressed °These are common complaints, possibly related to your anesthesia medications that put you to sleep, stress of surgery, and change in lifestyle.  This usually goes away a few weeks after surgery.  If these feelings continue, call your primary care doctor. °  °Wound Care: You may have surgical glue, steri-strips, or staples over your incisions after  surgery °• Surgical glue:  Looks like a clear film over your incisions and will wear off a little at a time °• Steri-strips: Strips of tape over your incisions. You may notice a yellowish color on the skin under the steri-strips. This is used to make the   steri-strips stick better. Do not pull the steri-strips off - let them fall off °• Staples: Staples may be removed before you leave the hospital °o If you go home with staples, call [facility], [phone] at for an appointment with your surgeon’s nurse to have staples removed 10 days after surgery. °• Showering: You may shower two (2) days after your surgery unless your surgeon tells you differently °o Wash gently around incisions with warm soapy water, rinse well, and gently pat dry  °o No tub baths until staples are removed, steri-strips fall off or glue is gone.  °  °Medications: • Medications should be liquid or crushed if larger than the size of a dime °• Extended release pills (medication that release a little bit at a time through the day) should NOT be crushed or cut. (examples include XL, ER, DR, SR) °• Depending on the size and number of medications you take, you may need to space (take a few throughout the day)/change the time you take your medications so that you do not over-fill your pouch (smaller stomach) °• Make sure you follow-up with your primary care doctor to   make medication changes needed during rapid weight loss and life-style changes °• If you have diabetes, follow up with the doctor that orders your diabetes medication(s) within one week after surgery and check your blood sugar regularly. °• Do not drive while taking prescription pain medication  °• It is ok to take Tylenol by the bottle instructions with your pain medicine or instead of your pain medicine as needed.  DO NOT TAKE NSAIDS (EXAMPLES OF NSAIDS:  IBUPROFREN/ NAPROXEN)  °Diet:                    First 2 Weeks ° You will see the dietician t about two (2) weeks  after your surgery. The dietician will increase the types of foods you can eat if you are handling liquids well: °• If you have severe vomiting or nausea and cannot keep down clear liquids lasting longer than 1 day, call your surgeon @ ([phone]) °Protein Shake °• Drink at least 2 ounces of shake 5-6 times per day °• Each serving of protein shakes (usually 8 - 12 ounces) should have: °o 15 grams of protein  °o And no more than 5 grams of carbohydrate  °• Goal for protein each day: °o Men = 80 grams per day °o Women = 60 grams per day °• Protein powder may be added to fluids such as non-fat milk or Lactaid milk or unsweetened Soy/Almond milk (limit to 35 grams added protein powder per serving) ° °Hydration °• Slowly increase the amount of water and other clear liquids as tolerated (See Acceptable Fluids) °• Slowly increase the amount of protein shake as tolerated  °•  Sip fluids slowly and throughout the day.  Do not use straws. °• May use sugar substitutes in small amounts (no more than 6 - 8 packets per day; i.e. Splenda) ° °Fluid Goal °• The first goal is to drink at least 8 ounces of protein shake/drink per day (or as directed by the nutritionist); some examples of protein shakes are Syntrax Nectar, Adkins Advantage, EAS Edge HP, and Unjury. See handout from pre-op Bariatric Education Class: °o Slowly increase the amount of protein shake you drink as tolerated °o You may find it easier to slowly sip shakes throughout the day °o It is important to get your proteins in first °• Your fluid goal is to drink 64 - 100 ounces of fluid daily °o It may take a few weeks to build up to this °• 32 oz (or more) should be clear liquids  °And  °• 32 oz (or more) should be full liquids (see below for examples) °• Liquids should not contain sugar, caffeine, or carbonation ° °Clear Liquids: °• Water or Sugar-free flavored water (i.e. Fruit H2O, Propel) °• Decaffeinated coffee or tea (sugar-free) °• Crystal Lite, Wyler’s Lite,  Minute Maid Lite °• Sugar-free Jell-O °• Bouillon or broth °• Sugar-free Popsicle:   *Less than 20 calories each; Limit 1 per day ° °Full Liquids: °Protein Shakes/Drinks + 2 choices per day of other full liquids °• Full liquids must be: °o No More Than 15 grams of Carbs per serving  °o No More Than 3 grams of Fat per serving °• Strained low-fat cream soup (except Cream of Potato or Tomato) °• Non-Fat milk °• Fat-free Lactaid Milk °• Unsweetened Soy Or Unsweetened Almond Milk °• Low Sugar yogurt (Dannon Lite & Fit, Greek yogurt; Oikos Triple Zero; Chobani Simply 100; Yoplait 100 calorie Greek - No Fruit on the Bottom) ° °  °Vitamins   and Minerals • Start 1 day after surgery unless otherwise directed by your surgeon °• 2 Chewable Bariatric Specific Multivitamin / Multimineral Supplement with iron (Example: Bariatric Advantage Multi EA) °• Chewable Calcium with Vitamin D-3 °(Example: 3 Chewable Calcium Plus 600 with Vitamin D-3) °o Take 500 mg three (3) times a day for a total of 1500 mg each day °o Do not take all 3 doses of calcium at one time as it may cause constipation, and you can only absorb 500 mg  at a time  °o Do not mix multivitamins containing iron with calcium supplements; take 2 hours apart °• Menstruating women and those with a history of anemia (a blood disease that causes weakness) may need extra iron °o Talk with your doctor to see if you need more iron °• Do not stop taking or change any vitamins or minerals until you talk to your dietitian or surgeon °• Your Dietitian and/or surgeon must approve all vitamin and mineral supplements °  °Activity and Exercise: Limit your physical activity as instructed by your doctor.  It is important to continue walking at home.  During this time, use these guidelines: °• Do not lift anything greater than ten (10) pounds for at least two (2) weeks °• Do not go back to work or drive until your surgeon says you can °• You may have sex when you feel comfortable  °o It is  VERY important for female patients to use a reliable birth control method; fertility often increases after surgery  °o All hormonal birth control will be ineffective for 30 days after surgery due to medications given during surgery a barrier method must be used. °o Do not get pregnant for at least 18 months °• Start exercising as soon as your doctor tells you that you can °o Make sure your doctor approves any physical activity °• Start with a simple walking program °• Walk 5-15 minutes each day, 7 days per week.  °• Slowly increase until you are walking 30-45 minutes per day °Consider joining our BELT program. [phone] or email [email or web address] °  °Special Instructions Things to remember: °• Use your CPAP when sleeping if this applies to you ° °• [facility] has two free Bariatric Surgery Support Groups that meet monthly °o The 3rd Thursday of each month, 6 pm, [facility] [facility]  °o The 2nd Friday of each month, 11:45 am in the private dining room in the basement of [place] °• It is very important to keep all follow up appointments with your surgeon, dietitian, primary care physician, and behavioral health practitioner °• Routine follow up schedule with your surgeon include appointments at 2-3 weeks, 6-8 weeks, 6 months, and 1 year at a minimum.  Your surgeon may request to see you more often.   °o After the first year, please follow up with your bariatric surgeon and dietitian at least once a year in order to maintain best weight loss results °[facility]: [phone] °[facility]: [phone] °Bariatric Nurse Coordinator: [phone] °  °   Reviewed and Endorsed  °by [place] Patient Education Committee, June, 2016 °Edits Approved: Aug, 2018 ° ° ° °

## 2018-07-18 NOTE — Progress Notes (Signed)
Pt started Incentive spirometer.

## 2018-07-18 NOTE — Transfer of Care (Signed)
Immediate Anesthesia Transfer of Care Note  Patient: Kendra Nolan  Procedure(s) Performed: LAPAROSCOPIC GASTRIC SLEEVE RESECTION, UPPER ENDO, ERAS Pathway (N/A Abdomen)  Patient Location: PACU  Anesthesia Type:General  Level of Consciousness: awake, alert  and oriented  Airway & Oxygen Therapy: Patient Spontanous Breathing and Patient connected to face mask oxygen  Post-op Assessment: Report given to RN and Post -op Vital signs reviewed and stable  Post vital signs: Reviewed and stable  Last Vitals:  Vitals Value Taken Time  BP    Temp    Pulse    Resp    SpO2      Last Pain:  Vitals:   07/18/18 0642  TempSrc:   PainSc: 0-No pain         Complications: No apparent anesthesia complications

## 2018-07-18 NOTE — Anesthesia Procedure Notes (Signed)
Procedure Name: Intubation Date/Time: 07/18/2018 7:42 AM Performed by: Maxwell Caul, CRNA Pre-anesthesia Checklist: Patient identified, Emergency Drugs available, Suction available and Patient being monitored Patient Re-evaluated:Patient Re-evaluated prior to induction Oxygen Delivery Method: Circle system utilized Preoxygenation: Pre-oxygenation with 100% oxygen Induction Type: IV induction Ventilation: Mask ventilation without difficulty and Oral airway inserted - appropriate to patient size Laryngoscope Size: Mac and 4 Grade View: Grade I Tube type: Oral Tube size: 7.5 mm Number of attempts: 1 Airway Equipment and Method: Stylet and Oral airway Placement Confirmation: ETT inserted through vocal cords under direct vision,  positive ETCO2 and breath sounds checked- equal and bilateral Secured at: 21 cm Tube secured with: Tape Dental Injury: Teeth and Oropharynx as per pre-operative assessment

## 2018-07-18 NOTE — Progress Notes (Addendum)
PHARMACY CONSULT FOR:  Risk Assessment for Post-Discharge VTE Following Bariatric Surgery  Post-Discharge VTE Risk Assessment: This patient's probability of 30-day post-discharge VTE is increased due to the factors marked:   Female    Age >/=60 years   x BMI >/=50 kg/m2    CHF    Dyspnea at Rest    Paraplegia  x  Non-gastric-band surgery    Operation Time >/=3 hr    Return to OR     Length of Stay >/= 3 d   Predicted probability of 30-day post-discharge VTE: 0.27%  Other patient-specific factors to consider:   Recommendation for Discharge: No pharmacologic prophylaxis post-discharge       Kendra Nolan is a 27 y.o. female who underwent  laparoscopic sleeve gastrectomy, upper endoscopy on 07/18/2018   Case start: 0758 Case end: 0904   Allergies  Allergen Reactions  . Codeine Hives    Codeine cough syrup  . Clarithromycin Hives  . Penicillins Hives    Has patient had a PCN reaction causing immediate rash, facial/tongue/throat swelling, SOB or lightheadedness with hypotension: Yes Has patient had a PCN reaction causing severe rash involving mucus membranes or skin necrosis: Yes Has patient had a PCN reaction that required hospitalization: No Has patient had a PCN reaction occurring within the last 10 years: No If all of the above answers are "NO", then may proceed with Cephalosporin use.     Patient Measurements: Height: 5\' 1"  (154.9 cm) Weight: 284 lb (128.8 kg) IBW/kg (Calculated) : 47.8 Body mass index is 53.66 kg/m.  No results for input(s): WBC, HGB, HCT, PLT, APTT, CREATININE, LABCREA, CREATININE, CREAT24HRUR, MG, PHOS, ALBUMIN, PROT, ALBUMIN, AST, ALT, ALKPHOS, BILITOT, BILIDIR, IBILI in the last 72 hours. Estimated Creatinine Clearance: 129.3 mL/min (by C-G formula based on SCr of 0.82 mg/dL).    Past Medical History:  Diagnosis Date  . GERD (gastroesophageal reflux disease)   . Migraine      Medications Prior to Admission  Medication Sig  Dispense Refill Last Dose  . Multiple Vitamin (MULTIVITAMIN WITH MINERALS) TABS tablet Take 1 tablet by mouth daily.   07/17/2018 at Unknown time  . omeprazole (PRILOSEC) 20 MG capsule Take 20 mg by mouth daily.   07/17/2018 at Unknown time  . venlafaxine XR (EFFEXOR-XR) 150 MG 24 hr capsule TAKE 1 CAPSULE (150 MG TOTAL) BY MOUTH AT BEDTIME. 90 capsule 0 07/17/2018 at Unknown time  . TAYTULLA 1-20 MG-MCG(24) CAPS Take 1 tablet by mouth daily.  11 More than a month at Unknown time       Adalberto Coleikola Hero Kulish, PharmD, BCPS Pager [phone] 07/18/2018 12:59 PM

## 2018-07-18 NOTE — Op Note (Signed)
Procedure: Upper GI endoscopy  Description of procedure: Upper GI endoscopy is performed at the completion of laparoscopic sleeve gastrectomy by Dr.  Fredricka Bonineonnor.  The video endoscope was introduced into the upper esophagus and then passed to the EG junction at about 38 cm. The esophagus appeared normal. The gastric sleeve was entered. The sleeve was tensely distended with air while the outlet was obstructed under saline irrigation by the operating surgeon. There was no evidence of leak. There was quite a bit of solid food residue in the sleeve.  The staple line was intact and without bleeding. The scope was advanced to the antrum and pylorus visualized. There was no stricture or twisting or mucosal abnormality, and particularly no narrowing noted at the incisura.  The pouch was then desufflated and the scope withdrawn.  Mariella SaaBenjamin T Rayli Wiederhold MD, FACS  07/18/2018, 9:20 AM

## 2018-07-18 NOTE — Anesthesia Preprocedure Evaluation (Signed)
Anesthesia Evaluation  Patient identified by MRN, date of birth, ID band Patient awake    Reviewed: Allergy & Precautions, H&P , NPO status , Patient's Chart, lab work & pertinent test results  Airway Mallampati: II   Neck ROM: full    Dental   Pulmonary neg pulmonary ROS,    breath sounds clear to auscultation       Cardiovascular negative cardio ROS   Rhythm:regular Rate:Normal     Neuro/Psych  Headaches, PSYCHIATRIC DISORDERS Depression    GI/Hepatic GERD  ,  Endo/Other  Morbid obesity  Renal/GU      Musculoskeletal   Abdominal   Peds  Hematology   Anesthesia Other Findings   Reproductive/Obstetrics                             Anesthesia Physical Anesthesia Plan  ASA: II  Anesthesia Plan: General   Post-op Pain Management:    Induction: Intravenous  PONV Risk Score and Plan: 3 and Ondansetron, Dexamethasone, Midazolam, Treatment may vary due to age or medical condition and Scopolamine patch - Pre-op  Airway Management Planned: Oral ETT  Additional Equipment:   Intra-op Plan:   Post-operative Plan: Extubation in OR  Informed Consent: I have reviewed the patients History and Physical, chart, labs and discussed the procedure including the risks, benefits and alternatives for the proposed anesthesia with the patient or authorized representative who has indicated his/her understanding and acceptance.     Plan Discussed with: CRNA, Anesthesiologist and Surgeon  Anesthesia Plan Comments:         Anesthesia Quick Evaluation

## 2018-07-18 NOTE — Progress Notes (Signed)
Pt started on ice and water.

## 2018-07-18 NOTE — Interval H&P Note (Signed)
History and Physical Interval Note:  07/18/2018 7:08 AM  Kendra Nolan  has presented today for surgery, with the diagnosis of Morbid Obesity, GERD, Anxiety/Panic Disorder, HTN  The various methods of treatment have been discussed with the patient and family. After consideration of risks, benefits and other options for treatment, the patient has consented to  Procedure(s): LAPAROSCOPIC GASTRIC SLEEVE RESECTION, UPPER ENDO, ERAS Pathway (N/A) as a surgical intervention .  The patient's history has been reviewed, patient examined, no change in status, stable for surgery.  I have reviewed the patient's chart and labs.  Questions were answered to the patient's satisfaction.     [name] Lollie SailsA Telissa Palmisano

## 2018-07-18 NOTE — Op Note (Signed)
Operative Note  Kendra Kendra Nolan  [id number]  [id number]  07/18/2018   Surgeon: Lady Deutscherhelsea A ConnorMD  Assistant: Jaclynn GuarneriBen Hoxworth MD, Hedda SladePuja Gosai PA-C  Procedure performed: laparoscopic sleeve gastrectomy, upper endoscopy  Preop diagnosis: Morbid obesity Body mass index is 53.66 kg/m., intermittent reflux Post-op diagnosis/intraop findings: same. Retained food in the stomach noted on endoscopy.  Specimens: fundus Retained items: none EBL: minimal cc Complications: none  Description of procedure: After obtaining informed consent and administration of prophylactic lovenox in holding, the patient was taken to the operating room and placed supine on operating room table wheregeneral endotracheal anesthesia was initiated, preoperative antibiotics were administered, SCDs applied, and a formal timeout was performed. The abdomen was prepped and draped in usual sterile fashion. Peritoneal access was gained using a Visiport technique in the left upper quadrant and insufflation to 15 mmHg ensued without issue. Gross inspection revealed no evidence of injury or other abnormality. Under direct visualization three more 5 mm trochars were placed in the right and left hemiabdomen and the 15mm trocar in the right paramedian upper abdomen. Bilateral laparoscopic assisted TAPS blocks were performed with Exparel diluted with 0.25 percent Marcaine with epinephrine. The patient was placed in steep Trendelenburg and the liver retractor was introduced through an incision in the upper midline and secured to the post externally to maintain the left lobe retracted anteriorly. The hiatus was inspected, there was no evidence of hiatal hernia. Using the Harmonic scalpel, the greater curvature of the stomach was dissected away from the greater omentum and short gastric vessels were divided. This began 6 cm from the pylorus, and dissection proceeded until the left crus was clearly exposed. The 26[address] [place] VisiGi was then  introduced and directed down towards the pylorus. This was placed to suction against the lesser curve. Serial fires of the linear cutting stapler with seamguards were then employed to create our sleeve. The first 2 fires used green loads and ensured adequate room at the angularis incisura. One gold load and then 3 blue loads were then employed to create a narrow tubular stomach up to the angle of His, staying just outside the fat pad. The excised stomach was then removed through our 15 mm trocar site within an Endo Catch bag. The visigi was taken off of suction and a few puffs of air were introduced, inflating the sleeve. No bubbles were observed in the irrigation fluid around the stomach and the shape was noted to be a nice smooth tube without any narrowing at the angularis. The visigi was then removed. Upper endoscopy was performed by the assistant surgeon and the sleeve was noted to be airtight, the staple line was hemostatic. There was noted to be retained food material in the stomach. Please see his separate note. The endoscope was removed. The abdomen was inspected once more confirming hemostasis. The 15 mm trocar site fascia in the right upper abdomen was closed with 2 interrupted sutures of 0 Vicryl using the laparoscopic suture passer under direct visualization. The liver retractor was removed under direct visualization. The abdomen was then desufflated and all remaining trochars removed. The skin incisions were closed with running subcuticular Monocryl; benzoin, Steri-Strips and Band-Aids were applied The patient was then awakened, extubated and taken to PACU in stable condition.    All counts were correct at the completion of the case.

## 2018-07-19 ENCOUNTER — Encounter (HOSPITAL_COMMUNITY): Payer: Self-pay | Admitting: Surgery

## 2018-07-19 ENCOUNTER — Other Ambulatory Visit: Payer: Self-pay

## 2018-07-19 LAB — COMPREHENSIVE METABOLIC PANEL
ALT: 102 U/L — ABNORMAL HIGH (ref 0–44)
AST: 62 U/L — ABNORMAL HIGH (ref 15–41)
Albumin: 4 g/dL (ref 3.5–5.0)
Alkaline Phosphatase: 85 U/L (ref 38–126)
Anion gap: 9 (ref 5–15)
BILIRUBIN TOTAL: 0.7 mg/dL (ref 0.3–1.2)
BUN: 7 mg/dL (ref 6–20)
CO2: 24 mmol/L (ref 22–32)
Calcium: 8.9 mg/dL (ref 8.9–10.3)
Chloride: 108 mmol/L (ref 98–111)
Creatinine, Ser: 0.74 mg/dL (ref 0.44–1.00)
GFR calc non Af Amer: >60 mL/min (ref >60)
Glucose, Bld: 113 mg/dL — ABNORMAL HIGH (ref 70–99)
Potassium: 3.8 mmol/L (ref 3.5–5.1)
Sodium: 141 mmol/L (ref 135–145)
TOTAL PROTEIN: 6.9 g/dL (ref 6.5–8.1)

## 2018-07-19 LAB — CBC WITH DIFFERENTIAL/PLATELET
Abs Immature Granulocytes: 0.05 10*3/uL (ref 0.00–0.07)
Basophils Absolute: 0 10*3/uL (ref 0.0–0.1)
Basophils Relative: 0 %
Eosinophils Absolute: 0 10*3/uL (ref 0.0–0.5)
Eosinophils Relative: 0 %
HCT: 45.6 % (ref 36.0–46.0)
Hemoglobin: 14.8 g/dL (ref 12.0–15.0)
Immature Granulocytes: 0 %
LYMPHS ABS: 1.8 10*3/uL (ref 0.7–4.0)
Lymphocytes Relative: 15 %
MCH: 30.1 pg (ref 26.0–34.0)
MCHC: 32.5 g/dL (ref 30.0–36.0)
MCV: 92.9 fL (ref 80.0–100.0)
MONO ABS: 1.2 10*3/uL — AB (ref 0.1–1.0)
MONOS PCT: 9 %
Neutro Abs: 9.3 10*3/uL — ABNORMAL HIGH (ref 1.7–7.7)
Neutrophils Relative %: 76 %
Platelets: 372 10*3/uL (ref 150–400)
RBC: 4.91 MIL/uL (ref 3.87–5.11)
RDW: 12.9 % (ref 11.5–15.5)
WBC: 12.3 10*3/uL — ABNORMAL HIGH (ref 4.0–10.5)
nRBC: 0 % (ref 0.0–0.2)

## 2018-07-19 LAB — MAGNESIUM: Magnesium: 2.2 mg/dL (ref 1.7–2.4)

## 2018-07-19 MED ORDER — DOCUSATE SODIUM 100 MG PO CAPS: 100.0000 mg | ORAL_CAPSULE | Freq: Two times a day (BID) | ORAL | 0 refills | Status: DC

## 2018-07-19 MED ORDER — PANTOPRAZOLE SODIUM 40 MG PO TBEC: 40.0000 mg | DELAYED_RELEASE_TABLET | Freq: Every day | ORAL | 0 refills | Status: DC

## 2018-07-19 MED ORDER — ONDANSETRON 4 MG PO TBDP: 4.0000 mg | ORAL_TABLET | Freq: Four times a day (QID) | ORAL | 0 refills | Status: DC | PRN

## 2018-07-19 MED ORDER — ACETAMINOPHEN 160 MG/5ML PO SOLN: 650.0000 mg | Freq: Four times a day (QID) | ORAL | 0 refills | Status: DC | PRN

## 2018-07-19 MED ORDER — TRAMADOL HCL 50 MG PO TABS: 50.0000 mg | ORAL_TABLET | Freq: Four times a day (QID) | ORAL | 0 refills | Status: DC | PRN

## 2018-07-19 NOTE — Progress Notes (Signed)
Patient alert and oriented, Post op day 1.  Provided support and encouragement.  Encouraged pulmonary toilet, ambulation and small sips of liquids.  All questions answered.  Will continue to monitor. 

## 2018-07-19 NOTE — Plan of Care (Signed)

## 2018-07-19 NOTE — Progress Notes (Addendum)

## 2018-07-19 NOTE — Discharge Summary (Signed)
Physician Discharge Summary  Kendra Nolan:[id number] DOB: May 24, 1991 DOA: 07/18/2018  PCP: Silverio Decamp, MD  Admit date: 07/18/2018 Discharge date: 07/19/2018 07/19/2018   Recommendations for Outpatient Follow-up:   Follow-up Information    Clovis Riley, MD. Go on 08/18/2018.   Specialty:  General Surgery Why:  at 1120 Contact information: [address] [place] [address] [phone]        Carlena Hurl, [place]. Go on 09/09/2018.   Specialty:  General Surgery Why:  at [address] information: [place] [address] [phone]          Discharge Diagnoses:  Active Problems:   Morbid obesity ([place])   Surgical Procedure: Laparoscopic Sleeve Gastrectomy, upper endoscopy  Discharge Condition: Good Disposition: Home  Diet recommendation: Postoperative sleeve gastrectomy diet (liquids only)  Filed Weights   07/18/18 0608  Weight: 128.8 kg     Hospital Course:  The patient was admitted for a planned laparoscopic sleeve gastrectomy. Please see operative note. Preoperatively the patient was given lovenox for DVT prophylaxis. Postoperative prophylactic Lovenox dosing was started on the evening of postoperative day 0. ERAS protocol was used. On the evening of postoperative day 0, the patient was started on water and ice chips. On postoperative day 1 the patient had no fever or tachycardia and was tolerating water in their diet was gradually advanced throughout the day. The patient was ambulating without difficulty. Their vital signs are stable without fever or tachycardia. Their hemoglobin had remained stable. The patient had received discharge instructions and counseling. They were deemed stable for discharge and had met discharge criteria   Discharge Instructions  Discharge Instructions    Ambulate hourly while awake   Complete by:  As directed    Call MD for:  difficulty breathing, headache or visual  disturbances   Complete by:  As directed    Call MD for:  persistant dizziness or light-headedness   Complete by:  As directed    Call MD for:  persistant nausea and vomiting   Complete by:  As directed    Call MD for:  redness, tenderness, or signs of infection (pain, swelling, redness, odor or green/yellow discharge around incision site)   Complete by:  As directed    Call MD for:  severe uncontrolled pain   Complete by:  As directed    Call MD for:  temperature >101 F   Complete by:  As directed    Incentive spirometry   Complete by:  As directed    Perform hourly while awake     Allergies as of 07/19/2018      Reactions   Codeine Hives   Codeine cough syrup   Clarithromycin Hives   Penicillins Hives   Has patient had a PCN reaction causing immediate rash, facial/tongue/throat swelling, SOB or lightheadedness with hypotension: Yes Has patient had a PCN reaction causing severe rash involving mucus membranes or skin necrosis: Yes Has patient had a PCN reaction that required hospitalization: No Has patient had a PCN reaction occurring within the last 10 years: No If all of the above answers are "NO", then may proceed with Cephalosporin use.      Medication List    STOP taking these medications   omeprazole 20 MG capsule Commonly known as:  PRILOSEC   TAYTULLA 1-20 MG-MCG(24) Caps Generic drug:  Norethin Ace-Eth Estrad-FE     TAKE these medications   acetaminophen 160 MG/5ML solution Commonly known as:  TYLENOL Take 20.3 mLs (650 mg total) by mouth every 6 (six) hours as needed.   docusate sodium 100 MG capsule Commonly known as:  COLACE Take 1 capsule (100 mg total) by mouth 2 (two) times daily. OK to stop if having loose bowel movements   multivitamin with minerals Tabs tablet Take 1 tablet by mouth daily.   ondansetron 4 MG disintegrating tablet Commonly known as:  ZOFRAN-ODT Take 1 tablet (4 mg total) by mouth every 6 (six) hours as needed for nausea or  vomiting.   pantoprazole 40 MG tablet Commonly known as:  PROTONIX Take 1 tablet (40 mg total) by mouth daily.   traMADol 50 MG tablet Commonly known as:  ULTRAM Take 1 tablet (50 mg total) by mouth every 6 (six) hours as needed (pain).   venlafaxine XR 150 MG 24 hr capsule Commonly known as:  EFFEXOR-XR TAKE 1 CAPSULE (150 MG TOTAL) BY MOUTH AT BEDTIME.      Follow-up Information    Clovis Riley, MD. Go on 08/18/2018.   Specialty:  General Surgery Why:  at 1120 Contact information: [address] [place] [address] [phone]        Carlena Hurl, [place]. Go on 09/09/2018.   Specialty:  General Surgery Why:  at [address] Contact information: [place] [address] [phone]            The results of significant diagnostics from this hospitalization (including imaging, microbiology, ancillary and laboratory) are listed below for reference.    Significant Diagnostic Studies: No results found.  Labs: Basic Metabolic Panel: Recent Labs  Lab 07/14/18 1539 07/19/18 0412  NA 138 141  K 3.8 3.8  CL 104 108  CO2 25 24  GLUCOSE 85 113*  BUN 13 7  CREATININE 0.82 0.74  CALCIUM 9.1 8.9  MG  --  2.2   Liver Function Tests: Recent Labs  Lab 07/14/18 1539 07/19/18 0412  AST 47* 62*  ALT 65* 102*  ALKPHOS 91 85  BILITOT 0.6 0.7  PROT 7.2 6.9  ALBUMIN 4.1 4.0    CBC: Recent Labs  Lab 07/14/18 1539 07/19/18 0412  WBC 9.2 12.3*  NEUTROABS 5.3 9.3*  HGB 14.5 14.8  HCT 44.2 45.6  MCV 94.0 92.9  PLT 342 372    CBG: No results for input(s): GLUCAP in the last 168 hours.  Active Problems:   Morbid obesity ([place])    Signed:  [facility], [place] [phone] 07/19/2018, 2:11 PM

## 2018-07-19 NOTE — Anesthesia Postprocedure Evaluation (Signed)
Anesthesia Post Note  Patient: Kendra Nolan  Procedure(s) Performed: LAPAROSCOPIC GASTRIC SLEEVE RESECTION, UPPER ENDO, ERAS Pathway (N/A Abdomen)     Patient location during evaluation: PACU Anesthesia Type: General Level of consciousness: awake and alert Pain management: pain level controlled Vital Signs Assessment: post-procedure vital signs reviewed and stable Respiratory status: spontaneous breathing, nonlabored ventilation, respiratory function stable and patient connected to nasal cannula oxygen Cardiovascular status: blood pressure returned to baseline and stable Postop Assessment: no apparent nausea or vomiting Anesthetic complications: no    Last Vitals:  Vitals:   07/19/18 0148 07/19/18 0605  BP: 123/69 132/83  Pulse: 80 80  Resp: 18 18  Temp: (!) 36.4 C 37.2 C  SpO2: 94% 96%    Last Pain:  Vitals:   07/19/18 0607  TempSrc:   PainSc: 5                  Mervin Ramires S

## 2018-07-19 NOTE — Progress Notes (Signed)
Pt alert, oriented, tolerating liquids. D/C papers given and pt was d/cd home.

## 2018-07-19 NOTE — Progress Notes (Signed)
S: had some nausea and a couple low volume emesis yesterday afternoon, none since and feels well this morning. Walking and using IS. Finishing last of her water just now.   Vitals, labs, intake/output, and orders reviewed at this time. Afebrile, no tachycardia, sats 94-100 RA. CMP unremarkable, WBC 12.3 (9.2 preop), Hgb 14.8 (14.5), plt 372 (342). PO 210, UOP 3650  Gen: A&Ox3, no distress  H&N: EOMI, atraumatic, neck supple Chest: unlabored respirations, RRR Abd: soft, tender, nondistended, incisions c/d/i with steris/ bandaids Ext: warm, no edema Neuro: grossly normal  Lines/tubes/drains: PIV  A/P:  POD 1 sleeve gastrectomy, doing well -Continue clears, protein shakes -Continue ambulation and IS -Continue lovenox and SCDs while in bed  Plan discharge today   Phylliss Blakeshelsea Yeilyn Gent, MD [facility], [place] Pager [phone]

## 2018-07-25 ENCOUNTER — Telehealth (HOSPITAL_COMMUNITY): Payer: Self-pay

## 2018-07-25 NOTE — Telephone Encounter (Signed)
Left message to discuss post bariatric surgery follow up questions.  See below:   1.  Tell me about your pain and pain management?  2.  Let's talk about fluid intake.  How much total fluid are you taking in?  3.  How much protein have you taken in the last 2 days?  4.  Have you had nausea?  Tell me about when have experienced nausea and what you did to help?  5.  Has the frequency or color changed with your urine?  6.  Tell me what your incisions look like?  7.  Have you been passing gas? BM?  8.  If a problem or question were to arise who would you call?  Do you know contact numbers for [facility], [facility], and [facility]?  9.  How has the walking going?  10.  How are your vitamins and calcium going?  How are you taking them? 

## 2018-07-28 ENCOUNTER — Telehealth (HOSPITAL_COMMUNITY): Payer: Self-pay

## 2018-07-28 NOTE — Telephone Encounter (Signed)
Patient called to discuss post bariatric surgery follow up questions.  See below:   1.  Tell me about your pain and pain management?denies  2.  Let's talk about fluid intake.  How much total fluid are you taking in?52 ounces  3.  How much protein have you taken in the last 2 days?60  4.  Have you had nausea?  Tell me about when have experienced nausea and what you did to help?denies  5.  Has the frequency or color changed with your urine?urine clear no problems with dehydration  6.  Tell me what your incisions look like?looks good  7.  Have you been passing gas? BM?passing gas bm  8.  If a problem or question were to arise who would you call?  Do you know contact numbers for [facility], [facility], and [facility]?aware of how to contact all services  9.  How has the walking going?walking regularly  10.  How are your vitamins and calcium going?  How are you taking them?mvi and calcium without difficulty  Reminded of appointments and follow up with [facility].  NO questions

## 2018-08-01 ENCOUNTER — Encounter: Payer: BLUE CROSS/BLUE SHIELD | Attending: Surgery | Admitting: Skilled Nursing Facility1

## 2018-08-01 DIAGNOSIS — Z6841 Body Mass Index (BMI) 40.0 and over, adult: Secondary | ICD-10-CM | POA: Insufficient documentation

## 2018-08-01 DIAGNOSIS — F329 Major depressive disorder, single episode, unspecified: Secondary | ICD-10-CM | POA: Insufficient documentation

## 2018-08-01 DIAGNOSIS — Z713 Dietary counseling and surveillance: Secondary | ICD-10-CM | POA: Insufficient documentation

## 2018-08-01 DIAGNOSIS — E669 Obesity, unspecified: Secondary | ICD-10-CM

## 2018-08-01 DIAGNOSIS — Z79899 Other long term (current) drug therapy: Secondary | ICD-10-CM | POA: Insufficient documentation

## 2018-08-01 DIAGNOSIS — Z8261 Family history of arthritis: Secondary | ICD-10-CM | POA: Insufficient documentation

## 2018-08-01 DIAGNOSIS — Z8249 Family history of ischemic heart disease and other diseases of the circulatory system: Secondary | ICD-10-CM | POA: Diagnosis not present

## 2018-08-01 DIAGNOSIS — G43909 Migraine, unspecified, not intractable, without status migrainosus: Secondary | ICD-10-CM | POA: Insufficient documentation

## 2018-08-01 DIAGNOSIS — K219 Gastro-esophageal reflux disease without esophagitis: Secondary | ICD-10-CM | POA: Insufficient documentation

## 2018-08-01 DIAGNOSIS — F419 Anxiety disorder, unspecified: Secondary | ICD-10-CM | POA: Diagnosis not present

## 2018-08-02 ENCOUNTER — Encounter: Payer: Self-pay | Admitting: Skilled Nursing Facility1

## 2018-08-02 NOTE — Progress Notes (Signed)
Bariatric Class:  Appt start time: 1530 end time:  1630.  2 Week Post-Operative Nutrition Class  Patient was seen on 08/01/2018 for Post-Operative Nutrition education at [facility].   Surgery date: 07/18/2018 Surgery type: sleeve Start weight at [facility]: 290.6 Weight today: 268.6  TANITA  BODY COMP RESULTS  08/01/2018   BMI (kg/m^2) 50.8   Fat Mass (lbs) 144.8   Fat Free Mass (lbs) 123.8   Total Body Water (lbs) 92.4   The following the learning objectives were met by the patient during this course:  Identifies Phase 3A (Soft, High Proteins) Dietary Goals and will begin from 2 weeks post-operatively to 2 months post-operatively  Identifies appropriate sources of fluids and proteins   States protein recommendations and appropriate sources post-operatively  Identifies the need for appropriate texture modifications, mastication, and bite sizes when consuming solids  Identifies appropriate multivitamin and calcium sources post-operatively  Describes the need for physical activity post-operatively and will follow MD recommendations  States when to call healthcare provider regarding medication questions or post-operative complications  Handouts given during class include:  Phase 3A: Soft, High Protein Diet Handout  Follow-Up Plan: Patient will follow-up at [facility] in 6 weeks for 2 month post-op nutrition visit for diet advancement per MD.

## 2018-08-08 ENCOUNTER — Telehealth: Payer: Self-pay | Admitting: Skilled Nursing Facility1

## 2018-08-08 NOTE — Telephone Encounter (Signed)
RD called pt to verify fluid intake once starting soft, solid proteins 2 week post-bariatric surgery.   Daily Fluid intake: 40-50 Daily Protein intake: 60+  Concerns/issues:   Pt was encouraged to aim to get in 64 fluid ounces a day. Take your multi later in the day.

## 2018-09-12 ENCOUNTER — Encounter: Payer: BLUE CROSS/BLUE SHIELD | Attending: Surgery | Admitting: Dietician

## 2018-09-12 VITALS — Wt 253.6 lb

## 2018-09-12 DIAGNOSIS — F419 Anxiety disorder, unspecified: Secondary | ICD-10-CM | POA: Diagnosis not present

## 2018-09-12 DIAGNOSIS — Z8249 Family history of ischemic heart disease and other diseases of the circulatory system: Secondary | ICD-10-CM | POA: Diagnosis not present

## 2018-09-12 DIAGNOSIS — K219 Gastro-esophageal reflux disease without esophagitis: Secondary | ICD-10-CM | POA: Insufficient documentation

## 2018-09-12 DIAGNOSIS — F329 Major depressive disorder, single episode, unspecified: Secondary | ICD-10-CM | POA: Diagnosis not present

## 2018-09-12 DIAGNOSIS — Z79899 Other long term (current) drug therapy: Secondary | ICD-10-CM | POA: Diagnosis not present

## 2018-09-12 DIAGNOSIS — G43909 Migraine, unspecified, not intractable, without status migrainosus: Secondary | ICD-10-CM | POA: Diagnosis not present

## 2018-09-12 DIAGNOSIS — Z6841 Body Mass Index (BMI) 40.0 and over, adult: Secondary | ICD-10-CM | POA: Insufficient documentation

## 2018-09-12 DIAGNOSIS — Z8261 Family history of arthritis: Secondary | ICD-10-CM | POA: Diagnosis not present

## 2018-09-12 DIAGNOSIS — E669 Obesity, unspecified: Secondary | ICD-10-CM

## 2018-09-12 DIAGNOSIS — Z713 Dietary counseling and surveillance: Secondary | ICD-10-CM | POA: Diagnosis not present

## 2018-09-12 NOTE — Patient Instructions (Signed)
.   Continue to aim for a minimum of 64 fluid ounces 7 days a week with at least 30 ounces being plain water . Eat non-starchy vegetables 2 times a day 7 days a week . Start out with soft cooked vegetables today and tomorrow; if tolerated, begin to eat raw vegetables including salads . Per meal/snack, eat 3 ounces of protein first then start on non-starchy vegetables; once you understand how much of your meal leads to satisfaction (and not full) while still eating 3 ounces of protein and non-starchy vegetables, you can eat them in any order (figure out how much you can eat at a time to get enough but not too much)  . Continue to aim for 30 minutes of physical activity at least 5 times a week  

## 2018-09-12 NOTE — Progress Notes (Signed)
Bariatric Follow-Up Visit Medical Nutrition Therapy  Appt Start Time: 4:45pm End Time: 5:10pm  2 Months Post-Operative Sleeve Gastrectomy Surgery  Primary Concerns Today: Bariatric Nutrition Follow Up   NUTRITION ASSESSMENT  Anthropometrics  Start weight at [facility]: 290.6 lbs (date: 03/29/2018) Today's weight: 253.6 lbs Weight change: -15 lbs (since previous visit on: 08/01/2018)  TANITA  Body Composition Results  08/01/2018 09/12/2018   BMI (kg/m2) 50.8 46.4   Fat Mass (lbs) 144.8 125.2   Fat Free Mass (lbs) 123.8 128.4   Total Body Water (lbs) 92.4 95   Total Body Water (%) - 37.5   Clinical Medical Hx: obesity, PCOS Medications: see list  24-Hr Dietary Recall First Meal: Premier Protein shake Snack: pepperoni (or cheese cubes)   Second Meal: baked teriyaki chicken  Snack: cheese cubes   Third Meal: broiled shrimp Snack: none stated Beverages: water + sugar-free flavor packet/drops  Food & Nutrition Related Hx Dietary Hx: Pt states she drinks a protein shake every morning for breakfast because she can't tolerate foods first thing (same as before surgery.) Pt states she has not had any major concerns with eating or tolerating food/fluid.  Supplements: bariatric MVI  Estimated Daily Fluid Intake: 50 oz Estimated Daily Protein Intake: 60+ g  Physical Activity  Current average weekly physical activity: walking at work (plans to [employer] to do at home while she watches her 29 year old daughter after work)    Post-Op Goals Using straws: no Drinking while eating: no Chewing/swallowing difficulties: no Changes in vision: no Changes to mood/headaches: no Hair loss/changes to skin/nails: no Difficulty focusing/concentrating: no Sweating: no Dizziness/lightheadedness: no Palpitations: no  Carbonated/caffeinated beverages: no N/V/D/C/Gas: constipation  Abdominal pain: no Dumping syndrome: no Last Lap-Band fill: N/A   NUTRITION DIAGNOSIS   Overweight/obesity ([place]-3.3) related to past poor dietary habits and physical inactivity as evidenced by patient w/ completed Sleeve Gastrectomy surgery following dietary guidelines for continued weight loss.   NUTRITION INTERVENTION Nutrition counseling (C-1) and education (E-2) to facilitate bariatric surgery goals, including: . Diet advancement to the next phase (phase IV) now including non-starchy vegetables . The importance of consuming adequate calories as well as certain nutrients daily due to the body's need for essential vitamins, minerals, and fats . The importance of daily physical activity and to reach a goal of at least 150 minutes of moderate to vigorous physical activity weekly (or as directed by their physician) due to benefits such as increased musculature and improved lab values  Handouts Provided Include   Phase IV: Protein + Non-Starchy Vegetables  Learning Style & Readiness for Change Teaching method utilized: Visual & Auditory  Demonstrated degree of understanding via: Teach Back  Barriers to learning/adherence to lifestyle change: None Identified   MONITORING & EVALUATION Dietary intake, weekly physical activity, and body weight in 4 months.  Next Steps Patient is to follow-up in 4 months for 6 month post-op class.

## 2018-09-16 ENCOUNTER — Other Ambulatory Visit: Payer: Self-pay | Admitting: Sports Medicine

## 2018-09-16 DIAGNOSIS — F320 Major depressive disorder, single episode, mild: Secondary | ICD-10-CM

## 2018-12-12 ENCOUNTER — Other Ambulatory Visit: Payer: Self-pay | Admitting: Sports Medicine

## 2018-12-12 DIAGNOSIS — F320 Major depressive disorder, single episode, mild: Secondary | ICD-10-CM

## 2018-12-12 NOTE — Telephone Encounter (Signed)
Please contact to schedule

## 2018-12-12 NOTE — Telephone Encounter (Signed)
Has not had an official appointment since 2017, but set her up for either in person or a virtual visit

## 2018-12-12 NOTE — Telephone Encounter (Signed)
Attempted to reach patient. No voicemail set up.

## 2018-12-28 ENCOUNTER — Other Ambulatory Visit: Payer: Self-pay | Admitting: Sports Medicine

## 2018-12-28 DIAGNOSIS — F320 Major depressive disorder, single episode, mild: Secondary | ICD-10-CM

## 2018-12-29 ENCOUNTER — Encounter: Payer: Self-pay | Admitting: Sports Medicine

## 2018-12-29 ENCOUNTER — Ambulatory Visit (INDEPENDENT_AMBULATORY_CARE_PROVIDER_SITE_OTHER): Payer: BLUE CROSS/BLUE SHIELD | Admitting: Sports Medicine

## 2018-12-29 DIAGNOSIS — Z13220 Encounter for screening for lipoid disorders: Secondary | ICD-10-CM | POA: Insufficient documentation

## 2018-12-29 DIAGNOSIS — F320 Major depressive disorder, single episode, mild: Secondary | ICD-10-CM

## 2018-12-29 MED ORDER — VENLAFAXINE HCL ER 150 MG PO CP24: 150.0000 mg | ORAL_CAPSULE | Freq: Every day | ORAL | 1 refills | Status: DC

## 2018-12-29 MED ORDER — ALPRAZOLAM 0.25 MG PO TABS: 0.2500 mg | ORAL_TABLET | Freq: Every day | ORAL | 0 refills | Status: DC | PRN

## 2018-12-29 NOTE — Telephone Encounter (Signed)
rx request denied, per last request PCP wants her to schedule an OV follow up (virtual or in office).

## 2018-12-29 NOTE — Telephone Encounter (Signed)
Just schedule her for a Doximity visit today or tomorrow.

## 2018-12-29 NOTE — Assessment & Plan Note (Signed)
Checking routine labs 

## 2018-12-29 NOTE — Progress Notes (Signed)
Subjective:    CC: Follow-up  HPI: Kendra Nolan returns, she is a very pleasant 28 year old female with anxiety and depression, we have been treating her with Effexor, and her symptoms were extremely well controlled at 150 mg.  She has not seen me in several years, and needed an appointment before we continue prescribing.  She is doing well, no suicidal homicidal ideation, she does have some increased stressors lately, a small toddler as well as marital strife, currently undergoing marital counseling.  She also is due for some routine labs.  I reviewed the past medical history, family history, social history, surgical history, and allergies today and no changes were needed.  Please see the problem list section below in epic for further details.  Past Medical History: Past Medical History:  Diagnosis Date  . GERD (gastroesophageal reflux disease)   . Migraine    Past Surgical History: Past Surgical History:  Procedure Laterality Date  . LAPAROSCOPIC GASTRIC SLEEVE RESECTION N/A 07/18/2018   Procedure: LAPAROSCOPIC GASTRIC SLEEVE RESECTION, UPPER ENDO, ERAS Pathway;  Surgeon: Berna Bueonnor, Chelsea A, MD;  Location: [facility];  Service: General;  Laterality: N/A;  . TONSILLECTOMY    . TONSILLECTOMY AND ADENOIDECTOMY     Social History: Social History   Socioeconomic History  . Marital status: Married    Spouse name: Not on file  . Number of children: Not on file  . Years of education: Not on file  . Highest education level: Not on file  Occupational History  . Not on file  Social Needs  . Financial resource strain: Not on file  . Food insecurity:    Worry: Not on file    Inability: Not on file  . Transportation needs:    Medical: Not on file    Non-medical: Not on file  Tobacco Use  . Smoking status: Never Smoker  . Smokeless tobacco: Never Used  Substance and Sexual Activity  . Alcohol use: No  . Drug use: No  . Sexual activity: Yes    Birth control/protection: Abstinence   Comment: off bc pill for surgery  Lifestyle  . Physical activity:    Days per week: Not on file    Minutes per session: Not on file  . Stress: Not on file  Relationships  . Social connections:    Talks on phone: Not on file    Gets together: Not on file    Attends religious service: Not on file    Active member of club or organization: Not on file    Attends meetings of clubs or organizations: Not on file    Relationship status: Not on file  Other Topics Concern  . Not on file  Social History Narrative  . Not on file   Family History: Family History  Problem Relation Age of Onset  . Cancer Maternal Aunt   . Hypertension Mother   . Hyperlipidemia Father   . Hyperlipidemia Maternal Grandmother   . Hyperlipidemia Maternal Grandfather   . Hypertension Maternal Grandfather   . Hyperlipidemia Paternal Grandmother   . Hyperlipidemia Paternal Grandfather   . Asthma Other   . COPD Other    Allergies: Allergies  Allergen Reactions  . Codeine Hives    Codeine cough syrup  . Clarithromycin Hives  . Penicillins Hives    Has patient had a PCN reaction causing immediate rash, facial/tongue/throat swelling, SOB or lightheadedness with hypotension: Yes Has patient had a PCN reaction causing severe rash involving mucus membranes or skin necrosis: Yes Has patient  had a PCN reaction that required hospitalization: No Has patient had a PCN reaction occurring within the last 10 years: No If all of the above answers are "NO", then may proceed with Cephalosporin use.    Medications: See med rec.  Review of Systems: No fevers, chills, night sweats, weight loss, chest pain, or shortness of breath.   Objective:    General: Well Developed, well nourished, and in no acute distress.  Neuro: Alert and oriented x3, extra-ocular muscles intact, sensation grossly intact.  HEENT: Normocephalic, atraumatic, pupils equal round reactive to light, neck supple, no masses, no lymphadenopathy, thyroid  nonpalpable.  Skin: Warm and dry, no rashes. Cardiac: Regular rate and rhythm, no murmurs rubs or gallops, no lower extremity edema.  Respiratory: Clear to auscultation bilaterally. Not using accessory muscles, speaking in full sentences.  Impression and Recommendations:    Major depression Refilling Effexor, we are going to add a bit of Xanax to use as needed. She is having significant marital stressors and they are in couples counseling. She will do a Doximity visit with me in a month to see how things are going.  Screening for hyperlipidemia Checking routine labs  I spent 25 minutes with this patient, greater than 50% was face-to-face time counseling regarding the above diagnoses.  ___________________________________________ Ihor Austin. Benjamin Stain, M.D., ABFM., CAQSM. Primary Care and Sports Medicine [facility]  Adjunct Professor of Family Medicine  [employer]

## 2018-12-29 NOTE — Assessment & Plan Note (Signed)
Refilling Effexor, we are going to add a bit of Xanax to use as needed. She is having significant marital stressors and they are in couples counseling. She will do a Doximity visit with me in a month to see how things are going.

## 2018-12-29 NOTE — Telephone Encounter (Signed)
Appointment has been made. No further questions at this time.  

## 2019-01-10 ENCOUNTER — Other Ambulatory Visit: Payer: Self-pay

## 2019-01-10 ENCOUNTER — Encounter: Payer: BC Managed Care – PPO | Attending: Surgery | Admitting: Dietician

## 2019-01-10 ENCOUNTER — Encounter: Payer: Self-pay | Admitting: Dietician

## 2019-01-10 DIAGNOSIS — E669 Obesity, unspecified: Secondary | ICD-10-CM | POA: Insufficient documentation

## 2019-01-10 NOTE — Patient Instructions (Signed)
Return for Follow Up Visit in 3 Months

## 2019-01-10 NOTE — Progress Notes (Signed)
Follow-up visit:  Post-Operative Sleeve Gastrectomy Surgery  Medical Nutrition Therapy Appt start time: 3:30pm End time: 4:30pm  Primary concerns today: Post-operative Bariatric Surgery Nutrition Management 6 Month Post-Op Class  Surgery Date: 07/18/2018 Start weight at [facility]: 290.6 lbs (date: 03/29/2018)  Body Composition Scale 08/01/2018 09/12/2018 01/10/2019  BMI 50.8 46.4 Pt Declined  Total Body Fat % (144.8 lbs) (125.2 lbs)   Visceral Fat     Fat-Free Mass % (123.8 lbs) (128.4 lbs)    Total Body Water %  37.5    Muscle-Mass lbs     Body Fat Displacement            Torso  lbs            Left Leg  lbs            Right Leg  lbs            Left Arm  lbs            Right Arm   lbs        Information Reviewed/ Discussed During Appointment: -Review of composition scale numbers -Fluid requirements (64-100 ounces) -Protein requirements (60-80g) -Strategies for tolerating diet -Advancement of diet to include Starchy Vegetables -Barriers to inclusion of new foods -Inclusion of appropriate multivitamin and calcium supplements  -Exercise recommendations   Fluid intake: adequate (50 - 60 oz)  Medications: See List Supplementation: appropriate   Using straws: no Drinking while eating: no Having you been chewing well: yes Chewing/swallowing difficulties: no Changes in vision: no Changes to mood/headaches: no Hair loss/Cahnges to skin/Changes to nails: no Any difficulty focusing or concentrating: no Sweating: yes Dizziness/Lightheaded: no Palpitations: no  Carbonated beverages: no N/V/D/C/GAS: no Abdominal Pain: no Dumping syndrome: no  Recent physical activity:  ADL's, walking at the park throughout the week   Progress Towards Goal(s):  In Progress  Handouts given during visit include:  Phase V diet Progression   Goals Sheet  The Benefits of Exercise are endless.....  Support Group Topics  Measurements & Portion Sizes  Pt Chosen Goals: Nutrition Goals: I  will drink 64 ounces of any sugar free, carbonation free option 7 days a week by 02/21/2019  Functional Goals: I will not drink or sip a beverage with 3 of my meals 7 days a week by 02/21/2019  Teaching Method Utilized:  Visual Auditory Hands on  Demonstrated degree of understanding via:  Teach Back   Monitoring/Evaluation:  Dietary intake, exercise, and body weight. Follow up in 3 months for 9 month post-op visit.

## 2019-01-26 ENCOUNTER — Ambulatory Visit (INDEPENDENT_AMBULATORY_CARE_PROVIDER_SITE_OTHER): Payer: BC Managed Care – PPO | Admitting: Sports Medicine

## 2019-01-26 ENCOUNTER — Encounter: Payer: Self-pay | Admitting: Sports Medicine

## 2019-01-26 DIAGNOSIS — F320 Major depressive disorder, single episode, mild: Secondary | ICD-10-CM

## 2019-01-26 NOTE — Progress Notes (Signed)
Virtual Visit via WebEx/MyChart   I connected with  Kendra Nolan  on 01/26/19 via WebEx/MyChart/Doximity Video and verified that I am speaking with the correct person using two identifiers.   I discussed the limitations, risks, security and privacy concerns of performing an evaluation and management service by WebEx/MyChart/Doximity Video, including the higher likelihood of inaccurate diagnosis and treatment, and the availability of in person appointments.  We also discussed the likely need of an additional face to face encounter for complete and high quality delivery of care.  I also discussed with the patient that there may be a patient responsible charge related to this service. The patient expressed understanding and wishes to proceed.  Provider location is either at home or medical facility. Patient location is at their home, different from provider location. People involved in care of the patient during this telehealth encounter were myself, my nurse/medical assistant, and my front office/scheduling team member.  Subjective:    CC: Follow-up  HPI: Doing well with an occasional Xanax.  I reviewed the past medical history, family history, social history, surgical history, and allergies today and no changes were needed.  Please see the problem list section below in epic for further details.  Past Medical History: Past Medical History:  Diagnosis Date  . GERD (gastroesophageal reflux disease)   . Migraine    Past Surgical History: Past Surgical History:  Procedure Laterality Date  . LAPAROSCOPIC GASTRIC SLEEVE RESECTION N/A 07/18/2018   Procedure: LAPAROSCOPIC GASTRIC SLEEVE RESECTION, UPPER ENDO, ERAS Pathway;  Surgeon: Clovis Riley, MD;  Location: [facility];  Service: General;  Laterality: N/A;  . TONSILLECTOMY    . TONSILLECTOMY AND ADENOIDECTOMY     Social History: Social History   Socioeconomic History  . Marital status: Married    Spouse name: Not on file  .  Number of children: Not on file  . Years of education: Not on file  . Highest education level: Not on file  Occupational History  . Not on file  Social Needs  . Financial resource strain: Not on file  . Food insecurity    Worry: Not on file    Inability: Not on file  . Transportation needs    Medical: Not on file    Non-medical: Not on file  Tobacco Use  . Smoking status: Never Smoker  . Smokeless tobacco: Never Used  Substance and Sexual Activity  . Alcohol use: No  . Drug use: No  . Sexual activity: Yes    Birth control/protection: Abstinence    Comment: off bc pill for surgery  Lifestyle  . Physical activity    Days per week: Not on file    Minutes per session: Not on file  . Stress: Not on file  Relationships  . Social Herbalist on phone: Not on file    Gets together: Not on file    Attends religious service: Not on file    Active member of club or organization: Not on file    Attends meetings of clubs or organizations: Not on file    Relationship status: Not on file  Other Topics Concern  . Not on file  Social History Narrative  . Not on file   Family History: Family History  Problem Relation Age of Onset  . Cancer Maternal Aunt   . Hypertension Mother   . Hyperlipidemia Father   . Hyperlipidemia Maternal Grandmother   . Hyperlipidemia Maternal Grandfather   . Hypertension Maternal Grandfather   .  Hyperlipidemia Paternal Grandmother   . Hyperlipidemia Paternal Grandfather   . Asthma Other   . COPD Other    Allergies: Allergies  Allergen Reactions  . Codeine Hives    Codeine cough syrup  . Clarithromycin Hives  . Penicillins Hives    Has patient had a PCN reaction causing immediate rash, facial/tongue/throat swelling, SOB or lightheadedness with hypotension: Yes Has patient had a PCN reaction causing severe rash involving mucus membranes or skin necrosis: Yes Has patient had a PCN reaction that required hospitalization: No Has patient had  a PCN reaction occurring within the last 10 years: No If all of the above answers are "NO", then may proceed with Cephalosporin use.    Medications: See med rec.  Review of Systems: No fevers, chills, night sweats, weight loss, chest pain, or shortness of breath.   Objective:    General: Speaking full sentences, no audible heavy breathing.  Sounds alert and appropriately interactive.  Appears well.  Face symmetric.  Extraocular movements intact.  Pupils equal and round.  No nasal flaring or accessory muscle use visualized.  No other physical exam performed due to the non-physical nature of this visit.  Impression and Recommendations:    Major depression Symptoms are overall well controlled with Effexor and occasional alprazolam, she does have significant stressors at home. Happy with how things are going, return as needed for this.  I discussed the above assessment and treatment plan with the patient. The patient was provided an opportunity to ask questions and all were answered. The patient agreed with the plan and demonstrated an understanding of the instructions.   The patient was advised to call back or seek an in-person evaluation if the symptoms worsen or if the condition fails to improve as anticipated.   I provided 25 minutes of non-face-to-face time during this encounter, 15 minutes of additional time was needed to gather information, review chart, records, communicate/coordinate with staff remotely, troubleshooting the multiple errors that we get every time when trying to do video calls through the electronic medical record, WebEx, and Doximity, restart the encounter multiple times due to instability of the software, as well as complete documentation.   ___________________________________________ Ihor Austinhomas J. Benjamin Stainhekkekandam, M.D., ABFM., CAQSM. Primary Care and Sports Medicine [facility]  Adjunct Professor of Family Medicine  [employer]

## 2019-01-26 NOTE — Assessment & Plan Note (Signed)
Symptoms are overall well controlled with Effexor and occasional alprazolam, she does have significant stressors at home. Happy with how things are going, return as needed for this.

## 2019-02-01 LAB — CBC
HCT: 42.8 % (ref 35.0–45.0)
Hemoglobin: 14.5 g/dL (ref 11.7–15.5)
MCH: 31.9 pg (ref 27.0–33.0)
MCHC: 33.9 g/dL (ref 32.0–36.0)
MCV: 94.3 fL (ref 80.0–100.0)
MPV: 10.1 fL (ref 7.5–12.5)
Platelets: 373 10*3/uL (ref 140–400)
RBC: 4.54 10*6/uL (ref 3.80–5.10)
RDW: 12.5 % (ref 11.0–15.0)
WBC: 8.4 10*3/uL (ref 3.8–10.8)

## 2019-02-01 LAB — COMPLETE METABOLIC PANEL WITH GFR
AG Ratio: 1.6 (calc) (ref 1.0–2.5)
ALT: 14 U/L (ref 6–29)
AST: 14 U/L (ref 10–30)
Albumin: 3.9 g/dL (ref 3.6–5.1)
Alkaline phosphatase (APISO): 106 U/L (ref 31–125)
BUN: 8 mg/dL (ref 7–25)
CO2: 25 mmol/L (ref 20–32)
Calcium: 9.7 mg/dL (ref 8.6–10.2)
Chloride: 106 mmol/L (ref 98–110)
Creat: 0.73 mg/dL (ref 0.50–1.10)
GFR, Est African American: 130 mL/min/{1.73_m2} (ref >=60)
GFR, Est Non African American: 112 mL/min/{1.73_m2} (ref >=60)
Globulin: 2.5 g/dL (calc) (ref 1.9–3.7)
Glucose, Bld: 83 mg/dL (ref 65–99)
Potassium: 4.2 mmol/L (ref 3.5–5.3)
Sodium: 141 mmol/L (ref 135–146)
Total Bilirubin: 0.4 mg/dL (ref 0.2–1.2)
Total Protein: 6.4 g/dL (ref 6.1–8.1)

## 2019-02-01 LAB — HEMOGLOBIN A1C
Hgb A1c MFr Bld: 5.1 % of total Hgb (ref <5.7)
Mean Plasma Glucose: 100 (calc)
eAG (mmol/L): 5.5 (calc)

## 2019-02-01 LAB — VITAMIN B12: Vitamin B-12: 392 pg/mL (ref 200–1100)

## 2019-02-01 LAB — LIPID PANEL W/REFLEX DIRECT LDL
Cholesterol: 164 mg/dL (ref <200)
HDL: 41 mg/dL — ABNORMAL LOW (ref >=50)
LDL Cholesterol (Calc): 100 mg/dL (calc) — ABNORMAL HIGH
Non-HDL Cholesterol (Calc): 123 mg/dL (calc) (ref <130)
Total CHOL/HDL Ratio: 4 (calc) (ref <5.0)
Triglycerides: 132 mg/dL (ref <150)

## 2019-02-01 LAB — VITAMIN D 25 HYDROXY (VIT D DEFICIENCY, FRACTURES): Vit D, 25-Hydroxy: 59 ng/mL (ref 30–100)

## 2019-02-01 LAB — TSH: TSH: 1.52 mIU/L

## 2019-03-15 ENCOUNTER — Encounter: Payer: Self-pay | Admitting: Sports Medicine

## 2019-04-12 ENCOUNTER — Encounter: Payer: BC Managed Care – PPO | Attending: Surgery | Admitting: Dietician

## 2019-04-12 ENCOUNTER — Other Ambulatory Visit: Payer: Self-pay

## 2019-04-12 ENCOUNTER — Encounter: Payer: Self-pay | Admitting: Dietician

## 2019-04-12 DIAGNOSIS — E669 Obesity, unspecified: Secondary | ICD-10-CM | POA: Diagnosis present

## 2019-04-12 NOTE — Progress Notes (Signed)
Bariatric Follow-Up Visit Medical Nutrition Therapy  Appt Start Time: 4:30pm   End Time: 5:00pm  9 Months Post-Operative Sleeve Gastrectomy Surgery Surgery Date: 07/18/2018 Pt Expectation of Surgery / Goals: to be healthier for her daughter, be more active, weigh less than 200 lbs by end of 2020   NUTRITION ASSESSMENT  Anthropometrics  Start weight at [facility]: 290.6 lbs (date: 03/29/2018) Today's weight: 224.3 lbs Weight change: -29.3 lbs (since previous visit on: 09/12/2018) Total weight lost: -66.3  Body Composition Scale 08/01/2018 09/12/2018 01/10/2019 04/12/2019   Weight (lbs) 268.6 253.6 pt declined 224.3   BMI 50.8 46.4  42.5   Total Body Fat % (144.8 lbs) (125.2 lbs)  43.4      Visceral Fat    12   Fat-Free Mass % (123.8 lbs) (128.4 lbs)  56.5       Total Body Water %  37.5  42.7       Muscle-Mass (lbs)    30.6   Body Fat Displacement             Torso (lbs)    60.3         Left Leg (lbs)    12         Right Leg (lbs)    12         Left Arm (lbs)    6         Right Arm (lbs)    6   Clinical Medical Hx: obesity, PCOS Medications: see list  24-Hr Dietary Recall First Meal: Premier Protein shake Snack: pepperoni + plain rice cake  Second Meal: small carb balance tortilla + deli chicken + slice of cheese   Snack: cheese cubes   Third Meal: grilled chicken (or fast food meat w/o bread)  Snack: none stated Beverages: water + sugar-free flavor packet/drops  Food & Nutrition Related Hx Dietary Hx: Pt states she drinks a protein shake every morning for breakfast because she can't tolerate foods first thing (same as before surgery.) Pt states she has not had any major concerns with eating or tolerating food/fluid.  Supplements: bariatric MVI, calcium  Estimated Daily Fluid Intake: 50 oz Estimated Daily Protein Intake: 60+ g  Physical Activity  Current average weekly physical activity: walking at work and at the park    Post-Op Goals Using  straws: no Drinking while eating: no Chewing/swallowing difficulties: no Changes in vision: no Changes to mood/headaches: no Hair loss/changes to skin/nails: no Difficulty focusing/concentrating: no Sweating: no Dizziness/lightheadedness: no Palpitations: no  Carbonated/caffeinated beverages: no N/V/D/C/Gas: no  Abdominal pain: no Dumping syndrome: no   NUTRITION DIAGNOSIS  Overweight/obesity ([place]-3.3) related to past poor dietary habits and physical inactivity as evidenced by patient w/ completed Sleeve Gastrectomy surgery following dietary guidelines for continued weight loss.   NUTRITION INTERVENTION Nutrition counseling (C-1) and education (E-2) to facilitate bariatric surgery goals, including: . Diet advancement to the next phase (phase VI) now including fruit . The importance of consuming adequate calories as well as certain nutrients daily due to the body's need for essential vitamins, minerals, and fats . The importance of daily physical activity and to reach a goal of at least 150 minutes of moderate to vigorous physical activity weekly (or as directed by their physician) due to benefits such as increased musculature and improved lab values  Handouts Provided Include   Phase VI: Protein + Non-Starchy Vegetables + Fruit  Learning Style & Readiness for Change Teaching method utilized: Visual & Auditory  Demonstrated degree of  understanding via: Teach Back  Barriers to learning/adherence to lifestyle change: None Identified   MONITORING & EVALUATION Dietary intake, weekly physical activity, and body weight in 3 months.  Next Steps Patient is to follow-up in 3 months for 12 month post-op class.

## 2019-04-18 ENCOUNTER — Ambulatory Visit (INDEPENDENT_AMBULATORY_CARE_PROVIDER_SITE_OTHER): Payer: BC Managed Care – PPO | Admitting: Sports Medicine

## 2019-04-18 DIAGNOSIS — J0100 Acute maxillary sinusitis, unspecified: Secondary | ICD-10-CM | POA: Diagnosis not present

## 2019-04-18 MED ORDER — FLUTICASONE PROPIONATE 50 MCG/ACT NA SUSP: NASAL | 3 refills | Status: DC

## 2019-04-18 MED ORDER — DOXYCYCLINE HYCLATE 100 MG PO TABS: 100.0000 mg | ORAL_TABLET | Freq: Two times a day (BID) | ORAL | 0 refills | Status: AC

## 2019-04-18 NOTE — Assessment & Plan Note (Signed)
Penicillin allergic, macrolide allergic, using doxycycline, Flonase. Return to see me as needed.

## 2019-04-18 NOTE — Progress Notes (Signed)
Virtual Visit via WebEx/MyChart   I connected with  Kendra Nolan  on 04/18/19 via WebEx/MyChart/Doximity Video and verified that I am speaking with the correct person using two identifiers.   I discussed the limitations, risks, security and privacy concerns of performing an evaluation and management service by WebEx/MyChart/Doximity Video, including the higher likelihood of inaccurate diagnosis and treatment, and the availability of in person appointments.  We also discussed the likely need of an additional face to face encounter for complete and high quality delivery of care.  I also discussed with the patient that there may be a patient responsible charge related to this service. The patient expressed understanding and wishes to proceed.  Provider location is either at home or medical facility. Patient location is at their home, different from provider location. People involved in care of the patient during this telehealth encounter were myself, my nurse/medical assistant, and my front office/scheduling team member.  Subjective:    CC: Feeling sick  HPI: For 10 days this pleasant 28 year old female has had right-sided facial pain and pressure, radiation to the right ear with postnasal drip and cough, no fevers, chills, muscle aches, body aches, symptoms are moderate, persistent.  No shortness of breath, no GI symptoms.  I reviewed the past medical history, family history, social history, surgical history, and allergies today and no changes were needed.  Please see the problem list section below in epic for further details.  Past Medical History: Past Medical History:  Diagnosis Date  . GERD (gastroesophageal reflux disease)   . Migraine    Past Surgical History: Past Surgical History:  Procedure Laterality Date  . LAPAROSCOPIC GASTRIC SLEEVE RESECTION N/A 07/18/2018   Procedure: LAPAROSCOPIC GASTRIC SLEEVE RESECTION, UPPER ENDO, ERAS Pathway;  Surgeon: Clovis Riley, MD;   Location: [facility];  Service: General;  Laterality: N/A;  . TONSILLECTOMY    . TONSILLECTOMY AND ADENOIDECTOMY     Social History: Social History   Socioeconomic History  . Marital status: Married    Spouse name: Not on file  . Number of children: Not on file  . Years of education: Not on file  . Highest education level: Not on file  Occupational History  . Not on file  Social Needs  . Financial resource strain: Not on file  . Food insecurity    Worry: Not on file    Inability: Not on file  . Transportation needs    Medical: Not on file    Non-medical: Not on file  Tobacco Use  . Smoking status: Never Smoker  . Smokeless tobacco: Never Used  Substance and Sexual Activity  . Alcohol use: No  . Drug use: No  . Sexual activity: Yes    Birth control/protection: Abstinence    Comment: off bc pill for surgery  Lifestyle  . Physical activity    Days per week: Not on file    Minutes per session: Not on file  . Stress: Not on file  Relationships  . Social Herbalist on phone: Not on file    Gets together: Not on file    Attends religious service: Not on file    Active member of club or organization: Not on file    Attends meetings of clubs or organizations: Not on file    Relationship status: Not on file  Other Topics Concern  . Not on file  Social History Narrative  . Not on file   Family History: Family History  Problem Relation Age of Onset  . Cancer Maternal Aunt   . Hypertension Mother   . Hyperlipidemia Father   . Hyperlipidemia Maternal Grandmother   . Hyperlipidemia Maternal Grandfather   . Hypertension Maternal Grandfather   . Hyperlipidemia Paternal Grandmother   . Hyperlipidemia Paternal Grandfather   . Asthma Other   . COPD Other    Allergies: Allergies  Allergen Reactions  . Codeine Hives    Codeine cough syrup  . Clarithromycin Hives  . Penicillins Hives    Has patient had a PCN reaction causing immediate rash, facial/tongue/throat  swelling, SOB or lightheadedness with hypotension: Yes Has patient had a PCN reaction causing severe rash involving mucus membranes or skin necrosis: Yes Has patient had a PCN reaction that required hospitalization: No Has patient had a PCN reaction occurring within the last 10 years: No If all of the above answers are "NO", then may proceed with Cephalosporin use.    Medications: See med rec.  Review of Systems: No fevers, chills, night sweats, weight loss, chest pain, or shortness of breath.   Objective:    General: Speaking full sentences, no audible heavy breathing.  Sounds alert and appropriately interactive.  Appears well.  Face symmetric.  Extraocular movements intact.  Pupils equal and round.  No nasal flaring or accessory muscle use visualized.  No other physical exam performed due to the non-physical nature of this visit.  Impression and Recommendations:    Acute maxillary sinusitis Penicillin allergic, macrolide allergic, using doxycycline, Flonase. Return to see me as needed.  I discussed the above assessment and treatment plan with the patient. The patient was provided an opportunity to ask questions and all were answered. The patient agreed with the plan and demonstrated an understanding of the instructions.   The patient was advised to call back or seek an in-person evaluation if the symptoms worsen or if the condition fails to improve as anticipated.   I provided 25 minutes of non-face-to-face time during this encounter, 15 minutes of additional time was needed to gather information, review chart, records, communicate/coordinate with staff remotely, troubleshooting the multiple errors that we get every time when trying to do video calls through the electronic medical record, WebEx, and Doximity, restart the encounter multiple times due to instability of the software, as well as complete documentation.   ___________________________________________ Ihor Austinhomas J. Benjamin Stainhekkekandam,  M.D., ABFM., CAQSM. Primary Care and Sports Medicine [facility]  Adjunct Professor of Family Medicine  [employer]

## 2019-06-20 ENCOUNTER — Other Ambulatory Visit: Payer: Self-pay | Admitting: Sports Medicine

## 2019-06-20 DIAGNOSIS — F320 Major depressive disorder, single episode, mild: Secondary | ICD-10-CM

## 2019-07-18 ENCOUNTER — Ambulatory Visit: Payer: BC Managed Care – PPO | Admitting: Skilled Nursing Facility1

## 2019-08-02 ENCOUNTER — Ambulatory Visit (INDEPENDENT_AMBULATORY_CARE_PROVIDER_SITE_OTHER): Payer: BC Managed Care – PPO | Admitting: Sports Medicine

## 2019-08-02 DIAGNOSIS — Z23 Encounter for immunization: Secondary | ICD-10-CM | POA: Diagnosis not present

## 2019-08-04 DIAGNOSIS — M25551 Pain in right hip: Secondary | ICD-10-CM

## 2019-08-04 DIAGNOSIS — M25552 Pain in left hip: Secondary | ICD-10-CM

## 2019-08-04 HISTORY — DX: Pain in left hip: M25.551

## 2019-08-04 HISTORY — DX: Pain in left hip: M25.552

## 2019-09-06 ENCOUNTER — Telehealth: Payer: Self-pay | Admitting: *Deleted

## 2019-09-06 NOTE — Telephone Encounter (Signed)
Returned call from 10:58 AM. Left patient a message to call and schedule appointment.

## 2019-09-20 ENCOUNTER — Encounter: Payer: Self-pay | Admitting: Nurse Practitioner

## 2019-09-20 ENCOUNTER — Other Ambulatory Visit: Payer: Self-pay

## 2019-09-20 ENCOUNTER — Ambulatory Visit (INDEPENDENT_AMBULATORY_CARE_PROVIDER_SITE_OTHER): Payer: BC Managed Care – PPO | Admitting: Nurse Practitioner

## 2019-09-20 VITALS — BP 107/73 | HR 88 | Temp 98.2°F | Resp 16 | Ht 61.0 in | Wt 245.0 lb

## 2019-09-20 DIAGNOSIS — Z01419 Encounter for gynecological examination (general) (routine) without abnormal findings: Secondary | ICD-10-CM

## 2019-09-20 DIAGNOSIS — Z6841 Body Mass Index (BMI) 40.0 and over, adult: Secondary | ICD-10-CM

## 2019-09-20 DIAGNOSIS — Z3041 Encounter for surveillance of contraceptive pills: Secondary | ICD-10-CM

## 2019-09-20 MED ORDER — JUNEL FE 1.5/30 1.5-30 MG-MCG PO TABS: 1.0000 | ORAL_TABLET | Freq: Every day | ORAL | 4 refills | Status: DC

## 2019-09-20 NOTE — Progress Notes (Signed)
GYNECOLOGY ANNUAL PREVENTATIVE CARE ENCOUNTER NOTE  Subjective:   Kendra Nolan is a 29 y.o. G19P1011 female here for a routine annual gynecologic exam.  Current complaints: wants annual exam and refill of pills.   Denies abnormal vaginal bleeding, discharge, pelvic pain, problems with intercourse or other gynecologic concerns.  Has PCP that she sees for chronic medical problems.  Has had weight loss surgery in 2019 and has lost 80 pounds.  Is continuing to work with PCP for continued weight loss.  Is anticipating upcoming separation and divorce from husband.  States she has resources for legal help through her work.   Gynecologic History Patient's last menstrual period was 08/22/2019. Contraception: OCP (estrogen/progesterone) Last Pap: 09-13-2017. Results were: normal  Obstetric History OB History  Gravida Para Term Preterm AB Living  2 1 1   1 1   SAB TAB Ectopic Multiple Live Births  1     0 1    # Outcome Date GA Lbr Len/2nd Weight Sex Delivery Anes PTL Lv  2 Term 07/27/17 [redacted]w[redacted]d 04:21 / 01:15 7 lb 6.9 oz (3.37 kg) F Vag-Spont EPI  LIV  1 SAB             Past Medical History:  Diagnosis Date  . GERD (gastroesophageal reflux disease)   . Migraine     Past Surgical History:  Procedure Laterality Date  . LAPAROSCOPIC GASTRIC SLEEVE RESECTION N/A 07/18/2018   Procedure: LAPAROSCOPIC GASTRIC SLEEVE RESECTION, UPPER ENDO, ERAS Pathway;  Surgeon: 07/20/2018, MD;  Location: [facility];  Service: General;  Laterality: N/A;  . TONSILLECTOMY    . TONSILLECTOMY AND ADENOIDECTOMY      Current Outpatient Medications on File Prior to Visit  Medication Sig Dispense Refill  . ALPRAZolam (XANAX) 0.25 MG tablet Take 1 tablet (0.25 mg total) by mouth daily as needed for anxiety. 15 tablet 0  . fluticasone (FLONASE) 50 MCG/ACT nasal spray One spray in each nostril twice a day, use left hand for right nostril, and right hand for left nostril. 48 g 3  . Multiple Vitamin (MULTIVITAMIN  WITH MINERALS) TABS tablet Take 1 tablet by mouth daily.    Berna Bue venlafaxine XR (EFFEXOR-XR) 150 MG 24 hr capsule TAKE 1 CAPSULE (150 MG TOTAL) BY MOUTH AT BEDTIME. 90 capsule 1   No current facility-administered medications on file prior to visit.    Allergies  Allergen Reactions  . Codeine Hives    Codeine cough syrup  . Clarithromycin Hives  . Penicillins Hives    Has patient had a PCN reaction causing immediate rash, facial/tongue/throat swelling, SOB or lightheadedness with hypotension: Yes Has patient had a PCN reaction causing severe rash involving mucus membranes or skin necrosis: Yes Has patient had a PCN reaction that required hospitalization: No Has patient had a PCN reaction occurring within the last 10 years: No If all of the above answers are "NO", then may proceed with Cephalosporin use.     Social History   Socioeconomic History  . Marital status: Married    Spouse name: Not on file  . Number of children: Not on file  . Years of education: Not on file  . Highest education level: Not on file  Occupational History  . Not on file  Tobacco Use  . Smoking status: Never Smoker  . Smokeless tobacco: Never Used  Substance and Sexual Activity  . Alcohol use: No  . Drug use: No  . Sexual activity: Yes    Birth control/protection:  Pill    Comment: off bc pill for surgery  Other Topics Concern  . Not on file  Social History Narrative  . Not on file   Social Determinants of Health   Financial Resource Strain:   . Difficulty of Paying Living Expenses: Not on file  Food Insecurity:   . Worried About Programme researcher, broadcasting/film/video in the Last Year: Not on file  . Ran Out of Food in the Last Year: Not on file  Transportation Needs:   . Lack of Transportation (Medical): Not on file  . Lack of Transportation (Non-Medical): Not on file  Physical Activity:   . Days of Exercise per Week: Not on file  . Minutes of Exercise per Session: Not on file  Stress:   . Feeling of Stress :  Not on file  Social Connections:   . Frequency of Communication with Friends and Family: Not on file  . Frequency of Social Gatherings with Friends and Family: Not on file  . Attends Religious Services: Not on file  . Active Member of Clubs or Organizations: Not on file  . Attends Banker Meetings: Not on file  . Marital Status: Not on file  Intimate Partner Violence:   . Fear of Current or Ex-Partner: Not on file  . Emotionally Abused: Not on file  . Physically Abused: Not on file  . Sexually Abused: Not on file    Family History  Problem Relation Age of Onset  . Cancer Maternal Aunt   . Hypertension Mother   . Hyperlipidemia Father   . Hyperlipidemia Maternal Grandmother   . Hyperlipidemia Maternal Grandfather   . Hypertension Maternal Grandfather   . Hyperlipidemia Paternal Grandmother   . Hyperlipidemia Paternal Grandfather   . Asthma Other   . COPD Other     The following portions of the patient's history were reviewed and updated as appropriate: allergies, current medications, past family history, past medical history, past social history, past surgical history and problem list.  Review of Systems Pertinent items noted in HPI and remainder of comprehensive ROS otherwise negative.   Objective:  BP 107/73   Pulse 88   Temp 98.2 F (36.8 C)   Resp 16   Ht 5\' 1"  (1.549 m)   Wt 245 lb (111.1 kg)   LMP 08/22/2019   Breastfeeding No   BMI 46.29 kg/m  CONSTITUTIONAL: Well-developed, well-nourished female in no acute distress.  HENT:  Normocephalic, atraumatic, External right and left ear normal.  EYES: Conjunctivae and EOM are normal. Pupils are equal, round.  No scleral icterus.  NECK: Normal range of motion, supple, no masses.  Normal thyroid.  SKIN: Skin is warm and dry. No rash noted. Not diaphoretic. No erythema. No pallor. NEUROLOGIC: Alert and oriented to person, place, and time. Normal reflexes, muscle tone coordination. No cranial nerve deficit  noted. PSYCHIATRIC: Normal mood and affect. Normal behavior. Normal judgment and thought content. CARDIOVASCULAR: Normal heart rate noted, regular rhythm RESPIRATORY: Clear to auscultation bilaterally. Effort and breath sounds normal, no problems with respiration noted. BREASTS: Symmetric in size. No masses, skin changes, nipple drainage, or lymphadenopathy. ABDOMEN: Soft, no distention noted.  No tenderness, rebound or guarding.  PELVIC: deferred MUSCULOSKELETAL: Normal range of motion. No tenderness.  No cyanosis, clubbing, or edema.    Assessment and Plan:  1. Well female exam with routine gynecological exam Doing well.  No depression identified by client or provider.  2. BMI 45.0-49.9, adult (HCC) Continuing weight loss since gastric weight  loss surgery in 2019.  3.  surveillance of birth control pills Refill ordered nonsmoker  Will follow up results of pap smear and manage accordingly. Routine preventative health maintenance measures emphasized. Please refer to After Visit Summary for other counseling recommendations.    Earlie Server, RN, MSN, NP-BC Nurse Practitioner, [place] [facility]

## 2019-12-18 ENCOUNTER — Other Ambulatory Visit: Payer: Self-pay | Admitting: Sports Medicine

## 2019-12-18 DIAGNOSIS — F320 Major depressive disorder, single episode, mild: Secondary | ICD-10-CM

## 2020-02-22 ENCOUNTER — Ambulatory Visit (INDEPENDENT_AMBULATORY_CARE_PROVIDER_SITE_OTHER): Payer: BC Managed Care – PPO | Admitting: Sports Medicine

## 2020-02-22 ENCOUNTER — Encounter: Payer: Self-pay | Admitting: Sports Medicine

## 2020-02-22 ENCOUNTER — Ambulatory Visit (INDEPENDENT_AMBULATORY_CARE_PROVIDER_SITE_OTHER): Payer: BC Managed Care – PPO

## 2020-02-22 ENCOUNTER — Other Ambulatory Visit: Payer: Self-pay

## 2020-02-22 DIAGNOSIS — M25551 Pain in right hip: Secondary | ICD-10-CM

## 2020-02-22 DIAGNOSIS — M25552 Pain in left hip: Secondary | ICD-10-CM

## 2020-02-22 MED ORDER — MELOXICAM 15 MG PO TABS: ORAL_TABLET | ORAL | 3 refills | Status: DC

## 2020-02-22 MED ORDER — PREDNISONE 50 MG PO TABS: ORAL_TABLET | ORAL | 0 refills | Status: DC

## 2020-02-22 NOTE — Assessment & Plan Note (Signed)
This is a pleasant 29 year old female, for the past few months she has had increasing pain in her hips, that she localizes anterior at the groin as well as laterally over her hips, worse with going from sitting to standing, she also has some pain in her back. Differential is broad and includes referred pain from her lumbar spine as well as hip impingement and labral injury though she does not report significant mechanical symptoms, we will start conservatively, prednisone, followed by meloxicam, hip and herniated disc rehabilitation exercises, x-rays of her hip and lumbar spine, return to see me in 6 weeks, we will likely get an MRI if no better.

## 2020-02-22 NOTE — Progress Notes (Signed)
    Procedures performed today:    None.  Independent interpretation of notes and tests performed by another provider:   None.  Brief History, Exam, Impression, and Recommendations:    Bilateral hip pain This is a pleasant 29 year old female, for the past few months she has had increasing pain in her hips, that she localizes anterior at the groin as well as laterally over her hips, worse with going from sitting to standing, she also has some pain in her back. Differential is broad and includes referred pain from her lumbar spine as well as hip impingement and labral injury though she does not report significant mechanical symptoms, we will start conservatively, prednisone, followed by meloxicam, hip and herniated disc rehabilitation exercises, x-rays of her hip and lumbar spine, return to see me in 6 weeks, we will likely get an MRI if no better.    ___________________________________________ Kendra Nolan. Benjamin Stain, M.D., ABFM., CAQSM. Primary Care and Sports Medicine [facility]  Adjunct Instructor of Family Medicine  [employer]

## 2020-04-02 ENCOUNTER — Other Ambulatory Visit: Payer: Self-pay

## 2020-04-02 ENCOUNTER — Ambulatory Visit: Payer: BC Managed Care – PPO | Admitting: Advanced Practice Midwife

## 2020-04-02 ENCOUNTER — Encounter: Payer: Self-pay | Admitting: Advanced Practice Midwife

## 2020-04-02 VITALS — BP 107/71 | HR 78 | Resp 16 | Ht 61.0 in | Wt 252.0 lb

## 2020-04-02 DIAGNOSIS — R634 Abnormal weight loss: Secondary | ICD-10-CM | POA: Diagnosis not present

## 2020-04-02 DIAGNOSIS — N921 Excessive and frequent menstruation with irregular cycle: Secondary | ICD-10-CM | POA: Diagnosis not present

## 2020-04-02 MED ORDER — NORETHIN-ETH ESTRAD-FE BIPHAS 1 MG-10 MCG / 10 MCG PO TABS: 1.0000 | ORAL_TABLET | Freq: Every day | ORAL | 11 refills | Status: DC

## 2020-04-02 NOTE — Progress Notes (Signed)
  GYNECOLOGY PROGRESS NOTE  History:  29 y.o. G2P1011 presents to [facility] office today for problem gyn visit. She reports bleeding in between periods with her OCPs x 2-3 months.  She changed birth control pills last year due to breakthrough bleeding and is on a regular dose estrogen pill now. She is concerned about estrogen with a family hx of breast cancer.  She had weight loss surgery in 2019 and has lost close to 100 lbs since then.  She is continuing healthy weight loss with diet and exercise.   She denies h/a, dizziness, shortness of breath, n/v, or fever/chills.  There is no abdominal pain.  The following portions of the patient's history were reviewed and updated as appropriate: allergies, current medications, past family history, past medical history, past social history, past surgical history and problem list. Last pap smear in 2019 was normal.  Review of Systems:  Pertinent items are noted in HPI.   Objective:  Physical Exam Blood pressure 107/71, pulse 78, resp. rate 16, height 5\' 1"  (1.549 m), weight 252 lb (114.3 kg), last menstrual period 03/08/2020, not currently breastfeeding. VS reviewed, nursing note reviewed,  Constitutional: well developed, well nourished, no distress HEENT: normocephalic CV: normal rate Pulm/chest wall: normal effort Breast Exam: deferred Abdomen: soft Neuro: alert and oriented x 3 Skin: warm, dry Psych: affect normal Pelvic exam: Deferred  Assessment & Plan:   1. Breakthrough bleeding on birth control pills --Discussed options with pt including increasing estrogen. Junel is 30 mcg, but can go up to 35 with some brands. Pt concerned about estrogen and does not want to increase. Discussed alternative forms of contraception, including IUD which is more effective contraception, estrogen-free, and may lead to lighter and/or less frequent periods.  Pt does not desire to change from OCPs.  One option to try is a low dose but extended regimen pill,  with less placebo pills.  This may reduce breakthrough bleeding despite the drop in estrogen.   --Pt to try Loloestrin with f/u in office in 3 months  - Norethindrone-Ethinyl Estradiol-Fe Biphas (LO LOESTRIN FE) 1 MG-10 MCG / 10 MCG tablet; Take 1 tablet by mouth daily.  Dispense: 28 tablet; Refill: 11  2. Recent weight loss --Pt continuing weight loss and reducing fat/estrogen stores is likely cause of bleeding changes.  Bleeding may stabilize when weight loss goal is achieved or when rate of weight loss slows down.    05/08/2020, CNM 12:09 PM

## 2020-04-04 ENCOUNTER — Other Ambulatory Visit: Payer: Self-pay

## 2020-04-04 ENCOUNTER — Encounter: Payer: Self-pay | Admitting: Sports Medicine

## 2020-04-04 ENCOUNTER — Ambulatory Visit (INDEPENDENT_AMBULATORY_CARE_PROVIDER_SITE_OTHER): Payer: BC Managed Care – PPO | Admitting: Sports Medicine

## 2020-04-04 DIAGNOSIS — M25552 Pain in left hip: Secondary | ICD-10-CM

## 2020-04-04 DIAGNOSIS — M25551 Pain in right hip: Secondary | ICD-10-CM | POA: Diagnosis not present

## 2020-04-04 NOTE — Progress Notes (Signed)
    Procedures performed today:    None.  Independent interpretation of notes and tests performed by another provider:   I did personally review her hip x-rays which show subchondral sclerosis and osteophytes at the left acetabulum.  Brief History, Exam, Impression, and Recommendations:    Bilateral hip pain Kendra Nolan returns, she is a pleasant 29 year old female, we have been treating her for hip pain, I was not sure initially if it was coming from her back or her hip, today is declaring itself as more of a hip pain generator, she has pain anteriorly, left worse than right. I did review her x-rays again I do see subchondral sclerosis and osteophytosis of the left acetabulum. We are going to proceed with MR arthrogram of the hip, certainly the steroid should help her pain. Return to see me for the injection.  She has failed greater than 6 weeks of conservative measures. We are looking for a labral tear.    ___________________________________________ Kendra Nolan. Kendra Nolan, M.D., ABFM., CAQSM. Primary Care and Sports Medicine [facility]  Adjunct Instructor of Family Medicine  [employer]

## 2020-04-04 NOTE — Assessment & Plan Note (Signed)
Kendra Nolan returns, she is a pleasant 29 year old female, we have been treating her for hip pain, I was not sure initially if it was coming from her back or her hip, today is declaring itself as more of a hip pain generator, she has pain anteriorly, left worse than right. I did review her x-rays again I do see subchondral sclerosis and osteophytosis of the left acetabulum. We are going to proceed with MR arthrogram of the hip, certainly the steroid should help her pain. Return to see me for the injection.  She has failed greater than 6 weeks of conservative measures. We are looking for a labral tear.

## 2020-04-15 ENCOUNTER — Other Ambulatory Visit: Payer: BC Managed Care – PPO

## 2020-04-15 ENCOUNTER — Ambulatory Visit: Payer: BC Managed Care – PPO | Admitting: Sports Medicine

## 2020-04-16 ENCOUNTER — Telehealth: Payer: Self-pay | Admitting: *Deleted

## 2020-04-16 DIAGNOSIS — M25552 Pain in left hip: Secondary | ICD-10-CM

## 2020-04-16 DIAGNOSIS — M25551 Pain in right hip: Secondary | ICD-10-CM

## 2020-04-16 NOTE — Telephone Encounter (Signed)
We can try to get the other hip covered, I will order it, she may need to get it rescheduled to do both at the same time.  If her insurance company does not want to cover this she will just need to do the MRI on 1 hip but I can still inject both.

## 2020-04-16 NOTE — Telephone Encounter (Signed)
Pt called this afternoon wanting to get an mri BOTH hips on Monday and not just the left. She does agree that the left is worse than right but still wants to do bilateral with bilateral steroid injections.

## 2020-04-18 NOTE — Telephone Encounter (Signed)
No auth required with insurance. Imaging order faxed to [facility]. Patient advised, she wants to cancel MRI scheduled for Monday and reschedule for a time when both can be done at the same time. Myriam Jacobson out this week- sent her msg to contact patient next week to get this scheduled.

## 2020-04-22 ENCOUNTER — Ambulatory Visit: Payer: BC Managed Care – PPO | Admitting: Sports Medicine

## 2020-04-22 ENCOUNTER — Other Ambulatory Visit: Payer: BC Managed Care – PPO

## 2020-04-23 ENCOUNTER — Ambulatory Visit (INDEPENDENT_AMBULATORY_CARE_PROVIDER_SITE_OTHER): Payer: BC Managed Care – PPO

## 2020-04-23 ENCOUNTER — Ambulatory Visit (INDEPENDENT_AMBULATORY_CARE_PROVIDER_SITE_OTHER): Payer: BC Managed Care – PPO | Admitting: Sports Medicine

## 2020-04-23 ENCOUNTER — Other Ambulatory Visit: Payer: Self-pay

## 2020-04-23 DIAGNOSIS — M25552 Pain in left hip: Secondary | ICD-10-CM

## 2020-04-23 DIAGNOSIS — M25551 Pain in right hip: Secondary | ICD-10-CM | POA: Diagnosis not present

## 2020-04-23 MED ORDER — GADOBUTROL 1 MMOL/ML IV SOLN
1.0000 mL | Freq: Once | INTRAVENOUS | Status: AC | PRN
  Administered 2020-04-23: 1 mL via INTRAVENOUS

## 2020-04-23 NOTE — Assessment & Plan Note (Signed)
This is a pleasant 29 year old female, we have been treating her for her hip pain, we suspected more of a hip joint pain generator, anteriorly, she did have some subchondral sclerosis and osteophytosis of her acetabulum on x-rays, we are proceeding with bilateral MR arthrograms of her left and right hips with steroid injection as well. Treatment will depend on arthrogram results though I do think she will get some improvement from the steroid injected. Return to see me in a month.

## 2020-04-23 NOTE — Progress Notes (Signed)
    Procedures performed today:    Procedure: Real-time Ultrasound Guided gadolinium contrast injection of left hip joint Device: Samsung HS60  Verbal informed consent obtained.  Time-out conducted.  Noted no overlying erythema, induration, or other signs of local infection.  Skin prepped in a sterile fashion.  Local anesthesia: Topical Ethyl chloride.  With sterile technique and under real time ultrasound guidance: 22-gauge spinal needle advanced to the femoral head/neck junction, contacted bone and then injected 1 cc kenalog 40, 2 cc lidocaine, 2 cc bupivacaine, syringe switched and 0.1 mL gadolinium injected, syringe again switched and 10 cc sterile saline used to distend the joint. Joint visualized and capsule seen distending confirming intra-articular placement of contrast material and medication. Completed without difficulty  Advised to call if fevers/chills, erythema, induration, drainage, or persistent bleeding.  Images permanently stored in PACS Impression: Technically successful ultrasound guided gadolinium contrast injection for MR arthrography.  Please see separate MR arthrogram report.  Procedure: Real-time Ultrasound Guided gadolinium contrast injection of right hip joint Device: Samsung HS60  Verbal informed consent obtained.  Time-out conducted.  Noted no overlying erythema, induration, or other signs of local infection.  Skin prepped in a sterile fashion.  Local anesthesia: Topical Ethyl chloride.  With sterile technique and under real time ultrasound guidance: 22-gauge spinal needle advanced to the femoral head/neck junction, contacted bone and then injected 1 cc kenalog 40, 2 cc lidocaine, 2 cc bupivacaine, syringe switched and 0.1 mL gadolinium injected, syringe again switched and 10 cc sterile saline used to distend the joint. Joint visualized and capsule seen distending confirming intra-articular placement of contrast material and medication. Completed without  difficulty  Advised to call if fevers/chills, erythema, induration, drainage, or persistent bleeding.  Images permanently stored in PACS Impression: Technically successful ultrasound guided gadolinium contrast injection for MR arthrography.  Please see separate MR arthrogram report.  Independent interpretation of notes and tests performed by another provider:   None.  Brief History, Exam, Impression, and Recommendations:    Bilateral hip pain This is a pleasant 29 year old female, we have been treating her for her hip pain, we suspected more of a hip joint pain generator, anteriorly, she did have some subchondral sclerosis and osteophytosis of her acetabulum on x-rays, we are proceeding with bilateral MR arthrograms of her left and right hips with steroid injection as well. Treatment will depend on arthrogram results though I do think she will get some improvement from the steroid injected. Return to see me in a month.    ___________________________________________ Ihor Austin. Benjamin Stain, M.D., ABFM., CAQSM. Primary Care and Sports Medicine [facility]  Adjunct Instructor of Family Medicine  [employer]

## 2020-05-21 ENCOUNTER — Ambulatory Visit: Payer: BC Managed Care – PPO | Admitting: Sports Medicine

## 2020-05-27 ENCOUNTER — Other Ambulatory Visit: Payer: Self-pay

## 2020-05-27 ENCOUNTER — Ambulatory Visit (INDEPENDENT_AMBULATORY_CARE_PROVIDER_SITE_OTHER): Payer: BC Managed Care – PPO | Admitting: Sports Medicine

## 2020-05-27 DIAGNOSIS — M25552 Pain in left hip: Secondary | ICD-10-CM | POA: Diagnosis not present

## 2020-05-27 DIAGNOSIS — M25551 Pain in right hip: Secondary | ICD-10-CM | POA: Diagnosis not present

## 2020-05-27 MED ORDER — TRAMADOL HCL 50 MG PO TABS: 50.0000 mg | ORAL_TABLET | Freq: Three times a day (TID) | ORAL | 0 refills | Status: DC | PRN

## 2020-05-27 NOTE — Assessment & Plan Note (Signed)
This is a pleasant 29 year old female, she has bilateral hip pain, localized in the groin, x-rays and MRI showed evidence of degenerative changes in the joint. She has significant gelling when sitting for a while. There were some other findings such as gluteus medius tendinosis with I think are currently asymptomatic. She did have good relief initially from the steroid component of the arthrogram, but has a recurrence of discomfort, continue meloxicam, adding tramadol, formal physical therapy. I did explain to her the limitations in treating osteoarthritis.

## 2020-05-27 NOTE — Progress Notes (Signed)
    Procedures performed today:    None.  Independent interpretation of notes and tests performed by another provider:   None.  Brief History, Exam, Impression, and Recommendations:    Bilateral hip pain This is a pleasant 29 year old female, she has bilateral hip pain, localized in the groin, x-rays and MRI showed evidence of degenerative changes in the joint. She has significant gelling when sitting for a while. There were some other findings such as gluteus medius tendinosis with I think are currently asymptomatic. She did have good relief initially from the steroid component of the arthrogram, but has a recurrence of discomfort, continue meloxicam, adding tramadol, formal physical therapy. I did explain to her the limitations in treating osteoarthritis.     ___________________________________________ Ihor Austin. Benjamin Stain, M.D., ABFM., CAQSM. Primary Care and Sports Medicine [facility]  Adjunct Instructor of Family Medicine  [employer]

## 2020-06-04 ENCOUNTER — Other Ambulatory Visit: Payer: Self-pay | Admitting: Sports Medicine

## 2020-06-04 DIAGNOSIS — F320 Major depressive disorder, single episode, mild: Secondary | ICD-10-CM

## 2020-06-17 ENCOUNTER — Ambulatory Visit (INDEPENDENT_AMBULATORY_CARE_PROVIDER_SITE_OTHER): Payer: BC Managed Care – PPO | Admitting: Rehabilitative and Restorative Service Providers"

## 2020-06-17 ENCOUNTER — Other Ambulatory Visit: Payer: Self-pay

## 2020-06-17 ENCOUNTER — Encounter: Payer: Self-pay | Admitting: Rehabilitative and Restorative Service Providers"

## 2020-06-17 DIAGNOSIS — M25552 Pain in left hip: Secondary | ICD-10-CM

## 2020-06-17 DIAGNOSIS — M25551 Pain in right hip: Secondary | ICD-10-CM

## 2020-06-17 DIAGNOSIS — R293 Abnormal posture: Secondary | ICD-10-CM | POA: Diagnosis not present

## 2020-06-17 DIAGNOSIS — R29898 Other symptoms and signs involving the musculoskeletal system: Secondary | ICD-10-CM | POA: Diagnosis not present

## 2020-06-17 NOTE — Patient Instructions (Addendum)
  Myofacial ball release work in the front of hips - standing on lying on your belly  Backward walking   Access Code: [email or web address]: [email or web address]: 11/15/2021Prepared by: Araly Kaas HoltExercises  Standing Hip Extension - 4-5 x daily - 7 x weekly - 1 sets - 5 reps - 15-20 sec hold  Supine Transversus Abdominis Bracing with Pelvic Floor Contraction - 2 x daily - 7 x weekly - 1 sets - 10 reps - 10sec hold

## 2020-06-17 NOTE — Therapy (Signed)
[facility] [address] [place], [place], [address] Phone: [phone]   Fax:  [phone]  Physical Therapy Evaluation  Patient Details  Name: Kendra Nolan MRN: [id number] Date of Birth: 1990/08/21 Referring Provider (PT): Dr Benjamin Stain   Encounter Date: 06/17/2020   PT End of Session - 06/17/20 1703    Visit Number 1    Number of Visits 12    Date for PT Re-Evaluation 07/29/20    PT Start Time 1604    PT Stop Time 1654    PT Time Calculation (min) 50 min    Activity Tolerance Patient tolerated treatment well           Past Medical History:  Diagnosis Date   GERD (gastroesophageal reflux disease)    Migraine     Past Surgical History:  Procedure Laterality Date   LAPAROSCOPIC GASTRIC SLEEVE RESECTION N/A 07/18/2018   Procedure: LAPAROSCOPIC GASTRIC SLEEVE RESECTION, UPPER ENDO, ERAS Pathway;  Surgeon: Berna Bue, MD;  Location: [facility];  Service: General;  Laterality: N/A;   TONSILLECTOMY     TONSILLECTOMY AND ADENOIDECTOMY      There were no vitals filed for this visit.    Subjective Assessment - 06/17/20 1612    Subjective Pain reports that she has had bilat hip pain for the past 6 months with no known injury. She olny has pain when getting up out of chair. Pain is sharp lasting for ~ 5 min. She had baby in birth canal for 2-3 weeks before delivery creating spreading of hips with hip pain.    Pertinent History laparoscopic gastric sleeve resection; arthritis bilat hips    Patient Stated Goals stand up from sitting without pain    Currently in Pain? Yes    Pain Score 5    moving sit to stand   Pain Location Hip    Pain Orientation Right;Left    Pain Descriptors / Indicators Sharp;Shooting;Stabbing    Pain Type Chronic pain    Pain Radiating Towards groin area bilat    Pain Onset More than a month ago    Pain Frequency Intermittent    Aggravating Factors  sit to stand; can't get  comfortable at night    Pain Relieving Factors meds help some to rest at night              [facility] PT Assessment - 06/17/20 0001      Assessment   Medical Diagnosis Bilat hip pain     Referring Provider (PT) Dr Benjamin Stain    Onset Date/Surgical Date 12/02/19    Hand Dominance Left    Next MD Visit 4 weeks after PT     Prior Therapy none       Precautions   Precautions None      Restrictions   Weight Bearing Restrictions No      Balance Screen   Has the patient fallen in the past 6 months No      Home Environment   Living Environment Private residence    Living Arrangements Spouse/significant other;Children    Type of Home House    Home Access Stairs to enter    Home Layout Two level      Prior Function   Level of Independence Independent    Vocation Full time employment    Designer, 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     Leisure household chores; 29 yr  old       Observation/Other Assessments   Focus on Therapeutic Outcomes (FOTO)  31% limitation       Sensation   Additional Comments WFL's per pt report       Posture/Postural Control   Posture Comments flexed forward at hips; hyperextension bilat knees      AROM   Right/Left Hip --   tight hip extension and internal rotation bilat       Flexibility   Hamstrings WFL's bilat     Quadriceps tight bilat     ITB WFL's bilat     Piriformis tight bilat       Palpation   Palpation comment muscular tightness Rt > Lt iliopsoas                       Objective measurements completed on examination: See above findings.       [facility] Adult PT Treatment/Exercise - 06/17/20 0001      Therapeutic Activites    Therapeutic Activities Other Therapeutic Activities    Other Therapeutic Activities myofacial ball release work standing and prone       Knee/Hip Exercises: Stretches   Hip Flexor Stretch Right;Left;2 reps;30 seconds    Hip Flexor Stretch  Limitations seated       Knee/Hip Exercises: Standing   Hip Extension AAROM;Right;Left;5 reps   stretch for hip flexors      Knee/Hip Exercises: Supine   Other Supine Knee/Hip Exercises 4 part core 10 sec x 10 reps                   PT Education - 06/17/20 1635    Education Details HEP POC myofacial ball release work    [employer]) Educated Patient    Methods Explanation;Demonstration;Tactile cues;Verbal cues;Handout    Comprehension Verbalized understanding;Returned demonstration;Verbal cues required;Tactile cues required               PT Long Term Goals - 06/17/20 1707      PT LONG TERM GOAL #1   Title Decrease pain with sit to stand with patient reporting 0-1/10 pain with sit to stand    Time 6    Period Weeks    Status New    Target Date 07/29/20      PT LONG TERM GOAL #2   Title Improve hip extension with increased functional strength bilat LE's    Time 6    Period Weeks    Status New    Target Date 07/29/20      PT LONG TERM GOAL #3   Title Patient to demonstrate and/or verbalize good body mechanics with ADL's and functional activities    Time 6    Period Weeks    Status New    Target Date 07/29/20      PT LONG TERM GOAL #4   Title Independent in HEP    Time 6    Period Weeks    Status New    Target Date 07/29/20      PT LONG TERM GOAL #5   Title Improve FOTO to </= 22% limitation    Time 6    Period Weeks    Status New    Target Date 07/29/20                  Plan - 06/17/20 1703    Clinical Impression Statement Patient presents with 6+ month history of anterior hip/groin pain when she moves from sit  to stand. She has poor posture and alignment; limited hip mobility; significant muscular tightness through the anterior hips/iliopsoas musculature; pain with sit to stand and the first few seconds of standing or walking. Patient demonstrated and reported decrease in symptoms with treatment today. She will benefit form PT to address  problems identified.    Stability/Clinical Decision Making Stable/Uncomplicated    Clinical Decision Making Low    Rehab Potential Good    PT Frequency 2x / week    PT Duration 6 weeks    PT Treatment/Interventions ADLs/Self Care Home Management;Aquatic Therapy;Cryotherapy;Electrical Stimulation;Iontophoresis 4mg /ml Dexamethasone;Moist Heat;Ultrasound;Functional mobility training;Therapeutic activities;Therapeutic exercise;Neuromuscular re-education;Patient/family education;Manual techniques;Passive range of motion;Dry needling    PT Next Visit Plan review HEP; progress with myofacial relese work; core stabilization; posterior hip strengthening; manual work and modalities as indicated    PT Home Exercise Plan    Consulted and Agree with Plan of Care Patient           Patient will benefit from skilled therapeutic intervention in order to improve the following deficits and impairments:  Abnormal gait, Decreased range of motion, Difficulty walking, Increased muscle spasms, Pain, Decreased activity tolerance, Hypomobility, Impaired flexibility, Improper body mechanics, Decreased strength, Postural dysfunction  Visit Diagnosis: Pain in left hip - Plan: PT plan of care cert/re-cert  Pain in right hip - Plan: PT plan of care cert/re-cert  Other symptoms and signs involving the musculoskeletal system - Plan: PT plan of care cert/re-cert  Abnormal posture - Plan: PT plan of care cert/re-cert     Problem List Patient Active Problem List   Diagnosis Date Noted   Bilateral hip pain 02/22/2020   Acute maxillary sinusitis 04/18/2019   Screening for hyperlipidemia 12/29/2018   Infertility associated with anovulation 12/10/2015   Irregular menstrual cycle 07/09/2015   GERD (gastroesophageal reflux disease) 03/07/2015   Plantar fasciitis, bilateral 05/14/2014   Major depression 02/23/2013   Preventive measure 11/16/2012   Migraines 11/16/2012   Obesity 11/16/2012    Allergic rhinitis 11/16/2012    Tonnie Friedel 11/18/2012 PT, MPH  06/17/2020, 5:12 PM  [facility] [address] [place], [place], [place] Phone: [phone]   Fax:  [phone]  Name: Kendra Nolan MRN: [id number] Date of Birth: [birth year]

## 2020-06-24 ENCOUNTER — Other Ambulatory Visit: Payer: Self-pay

## 2020-06-24 ENCOUNTER — Ambulatory Visit (INDEPENDENT_AMBULATORY_CARE_PROVIDER_SITE_OTHER): Payer: BC Managed Care – PPO | Admitting: Physical Therapy

## 2020-06-24 ENCOUNTER — Ambulatory Visit: Payer: BC Managed Care – PPO | Admitting: Sports Medicine

## 2020-06-24 DIAGNOSIS — R293 Abnormal posture: Secondary | ICD-10-CM

## 2020-06-24 DIAGNOSIS — M25552 Pain in left hip: Secondary | ICD-10-CM

## 2020-06-24 DIAGNOSIS — R29898 Other symptoms and signs involving the musculoskeletal system: Secondary | ICD-10-CM

## 2020-06-24 DIAGNOSIS — M25551 Pain in right hip: Secondary | ICD-10-CM

## 2020-06-24 NOTE — Therapy (Signed)
[facility] [place] [address] [place], [place], [address] Phone: [phone]   Fax:  [phone]  Physical Therapy Treatment  Patient Details  Name: Kendra Nolan MRN: [id number] Date of Birth: 1991-07-16 Referring Provider (PT): Dr Benjamin Stain   Encounter Date: 06/24/2020   PT End of Session - 06/24/20 1113    Visit Number 2    Number of Visits 12    Date for PT Re-Evaluation 07/29/20    PT Start Time 1106    PT Stop Time 1151    PT Time Calculation (min) 45 min    Activity Tolerance Patient tolerated treatment well    Behavior During Therapy [facility] for tasks assessed/performed           Past Medical History:  Diagnosis Date  . GERD (gastroesophageal reflux disease)   . Migraine     Past Surgical History:  Procedure Laterality Date  . LAPAROSCOPIC GASTRIC SLEEVE RESECTION N/A 07/18/2018   Procedure: LAPAROSCOPIC GASTRIC SLEEVE RESECTION, UPPER ENDO, ERAS Pathway;  Surgeon: Berna Bue, MD;  Location: [facility];  Service: General;  Laterality: N/A;  . TONSILLECTOMY    . TONSILLECTOMY AND ADENOIDECTOMY      There were no vitals filed for this visit.   Subjective Assessment - 06/24/20 1113    Subjective Pt reports she can notice a difference (less pain) since started therapy; performing HEP 1x/day.    Pertinent History laparoscopic gastric sleeve resection; arthritis bilat hips    Patient Stated Goals stand up from sitting without pain    Currently in Pain? Yes    Pain Score 4    only during transition   Pain Location Hip    Pain Orientation Right;Left;Anterior    Pain Descriptors / Indicators Lambert Mody              [facility] PT Assessment - 06/24/20 0001      Assessment   Medical Diagnosis Bilat hip pain     Referring Provider (PT) Dr Benjamin Stain    Onset Date/Surgical Date 12/02/19    Hand Dominance Left    Next MD Visit 4 weeks after PT     Prior Therapy none            [facility] Adult PT  Treatment/Exercise - 06/24/20 0001      Knee/Hip Exercises: Stretches   Quad Stretch Right;Left;3 reps;20 seconds   towel above knee   Hip Flexor Stretch Right;Left;4 reps   15 sec each; standing x 2, seated x 2   Other Knee/Hip Stretches 5 reps of prone press up.     Other Knee/Hip Stretches standing hip adductor stretch at counter, x 15 sec each side; supine butterfly x 12 sec x 2      Knee/Hip Exercises: Aerobic   Nustep L4: legs only, 5 min       Knee/Hip Exercises: Seated   Clamshell with TheraBand Red;Green   5 reps of each, trial.    Other Seated Knee/Hip Exercises seated TA engaged, lap press x 3 sec x 3 reps    Sit to Sand 1 set;10 reps;without UE support   eccentric lowering, core engaged. painfree     Knee/Hip Exercises: Sidelying   Clams trial LL 2 reps, only felt pull in groin - stopped and performed in seated position.       Knee/Hip Exercises: Prone   Hip Extension Strengthening;Right;Left;2 sets;5 reps             PT Long Term Goals -  06/17/20 1707      PT LONG TERM GOAL #1   Title Decrease pain with sit to stand with patient reporting 0-1/10 pain with sit to stand    Time 6    Period Weeks    Status New    Target Date 07/29/20      PT LONG TERM GOAL #2   Title Improve hip extension with increased functional strength bilat LE's    Time 6    Period Weeks    Status New    Target Date 07/29/20      PT LONG TERM GOAL #3   Title Patient to demonstrate and/or verbalize good body mechanics with ADL's and functional activities    Time 6    Period Weeks    Status New    Target Date 07/29/20      PT LONG TERM GOAL #4   Title Independent in HEP    Time 6    Period Weeks    Status New    Target Date 07/29/20      PT LONG TERM GOAL #5   Title Improve FOTO to </= 22% limitation    Time 6    Period Weeks    Status New    Target Date 07/29/20                 Plan - 06/24/20 1106    Clinical Impression Statement Positive response from self  massage and stretches so far. Rt hip flexor tighter than LLE Pt able to complete sit to stand without pain, with cues of core engaged after completion of hip flexor stretches. Tolerated all exercises well. Progressing towards LTGs.    PT Home Exercise Plan [email or web address]           Patient will benefit from skilled therapeutic intervention in order to improve the following deficits and impairments:     Visit Diagnosis: Pain in left hip  Pain in right hip  Other symptoms and signs involving the musculoskeletal system  Abnormal posture     Problem List Patient Active Problem List   Diagnosis Date Noted  . Bilateral hip pain 02/22/2020  . Acute maxillary sinusitis 04/18/2019  . Screening for hyperlipidemia 12/29/2018  . Infertility associated with anovulation 12/10/2015  . Irregular menstrual cycle 07/09/2015  . GERD (gastroesophageal reflux disease) 03/07/2015  . Plantar fasciitis, bilateral 05/14/2014  . Major depression 02/23/2013  . Preventive measure 11/16/2012  . Migraines 11/16/2012  . Obesity 11/16/2012  . Allergic rhinitis 11/16/2012    Kendra Nolan 06/24/2020, 2:08 PM  [facility] [address] [place], [place], [address] Phone: [phone]   Fax:  [phone]  Name: Kendra Nolan MRN: [id number] Date of Birth: [birth year]

## 2020-07-03 ENCOUNTER — Encounter: Payer: BC Managed Care – PPO | Admitting: Rehabilitative and Restorative Service Providers"

## 2020-07-04 ENCOUNTER — Ambulatory Visit (INDEPENDENT_AMBULATORY_CARE_PROVIDER_SITE_OTHER): Payer: BC Managed Care – PPO | Admitting: Physical Therapy

## 2020-07-04 ENCOUNTER — Encounter: Payer: Self-pay | Admitting: Obstetrics and Gynecology

## 2020-07-04 ENCOUNTER — Other Ambulatory Visit: Payer: Self-pay

## 2020-07-04 ENCOUNTER — Ambulatory Visit: Payer: BC Managed Care – PPO | Admitting: Obstetrics and Gynecology

## 2020-07-04 VITALS — BP 107/74 | HR 86 | Resp 16 | Ht 61.0 in | Wt 248.0 lb

## 2020-07-04 DIAGNOSIS — M25551 Pain in right hip: Secondary | ICD-10-CM | POA: Diagnosis not present

## 2020-07-04 DIAGNOSIS — M25552 Pain in left hip: Secondary | ICD-10-CM

## 2020-07-04 DIAGNOSIS — R293 Abnormal posture: Secondary | ICD-10-CM | POA: Diagnosis not present

## 2020-07-04 DIAGNOSIS — R29898 Other symptoms and signs involving the musculoskeletal system: Secondary | ICD-10-CM | POA: Diagnosis not present

## 2020-07-04 DIAGNOSIS — Z3009 Encounter for other general counseling and advice on contraception: Secondary | ICD-10-CM

## 2020-07-04 NOTE — Progress Notes (Signed)
GYNECOLOGY OFFICE FOLLOW UP NOTE  History:  29 y.o. [id number] here today for follow up for starting OCPs 3 months ago for breakthrough bleeding. Started Loloestrin. Has done 3 months so far and likes it, had very minor breakthrough bleeding with the first two months, none in the 3rd. She would like to continue.  Past Medical History:  Diagnosis Date  . GERD (gastroesophageal reflux disease)   . Migraine     Past Surgical History:  Procedure Laterality Date  . LAPAROSCOPIC GASTRIC SLEEVE RESECTION N/A 07/18/2018   Procedure: LAPAROSCOPIC GASTRIC SLEEVE RESECTION, UPPER ENDO, ERAS Pathway;  Surgeon: Berna Bue, MD;  Location: [facility];  Service: General;  Laterality: N/A;  . TONSILLECTOMY    . TONSILLECTOMY AND ADENOIDECTOMY       Current Outpatient Medications:  .  ALPRAZolam (XANAX) 0.25 MG tablet, Take 1 tablet (0.25 mg total) by mouth daily as needed for anxiety., Disp: 15 tablet, Rfl: 0 .  fluticasone (FLONASE) 50 MCG/ACT nasal spray, One spray in each nostril twice a day, use left hand for right nostril, and right hand for left nostril., Disp: 48 g, Rfl: 3 .  meloxicam (MOBIC) 15 MG tablet, One tab PO qAM with a meal for 2 weeks, then daily prn pain., Disp: 30 tablet, Rfl: 3 .  Multiple Vitamin (MULTIVITAMIN WITH MINERALS) TABS tablet, Take 1 tablet by mouth daily., Disp: , Rfl:  .  Norethindrone-Ethinyl Estradiol-Fe Biphas (LO LOESTRIN FE) 1 MG-10 MCG / 10 MCG tablet, Take 1 tablet by mouth daily., Disp: 28 tablet, Rfl: 11 .  pantoprazole (PROTONIX) 40 MG tablet, Take 40 mg by mouth daily., Disp: , Rfl:  .  phentermine (ADIPEX-P) 37.5 MG tablet, Take 37.5 mg by mouth daily. Take half tab daily, Disp: , Rfl:  .  traMADol (ULTRAM) 50 MG tablet, Take 1-2 tablets (50-100 mg total) by mouth every 8 (eight) hours as needed for moderate pain. Maximum 6 tabs per day., Disp: 21 tablet, Rfl: 0 .  venlafaxine XR (EFFEXOR-XR) 150 MG 24 hr capsule, TAKE 1 CAPSULE (150 MG TOTAL) BY MOUTH  AT BEDTIME., Disp: 90 capsule, Rfl: 1  The following portions of the patient's history were reviewed and updated as appropriate: allergies, current medications, past family history, past medical history, past social history, past surgical history and problem list.   Review of Systems:  Pertinent items noted in HPI and remainder of comprehensive ROS otherwise negative.   Objective:  Physical Exam BP 107/74   Pulse 86   Resp 16   Ht 5\' 1"  (1.549 m)   Wt 248 lb (112.5 kg)   LMP 05/04/2020   Breastfeeding No   BMI 46.86 kg/m  CONSTITUTIONAL: Well-developed, well-nourished female in no acute distress.  HENT:  Normocephalic, atraumatic. External right and left ear normal. Oropharynx is clear and moist EYES: Conjunctivae and EOM are normal. Pupils are equal, round, and reactive to light. No scleral icterus.  NECK: Normal range of motion, supple, no masses SKIN: Skin is warm and dry. No rash noted. Not diaphoretic. No erythema. No pallor. NEUROLOGIC: Alert and oriented to person, place, and time. Normal reflexes, muscle tone coordination. No cranial nerve deficit noted. PSYCHIATRIC: Normal mood and affect. Normal behavior. Normal judgment and thought content. CARDIOVASCULAR: Normal heart rate noted RESPIRATORY: Effort normal, no problems with respiration noted ABDOMEN: Soft, no distention noted.   PELVIC: deferred MUSCULOSKELETAL: Normal range of motion. No edema noted.  Labs and Imaging No results found.  Assessment & Plan:  1. Encounter  for counseling regarding contraception Pt started on Loloestrin for breakthrough bleeding, happy with it so far Will cont Lolo Return 6 months or prn if breakthrough continues   Routine preventative health maintenance measures emphasized. Please refer to After Visit Summary for other counseling recommendations.   Return in about 6 months (around 01/02/2021) for Followup.  Total face-to-face time with patient: 15 minutes. Over 50% of encounter was  spent on counseling and coordination of care.  Baldemar Lenis, M.D. Attending Center for [employer] [facility])

## 2020-07-04 NOTE — Therapy (Signed)
[facility] [place] [address] [place], [place], [address] Phone: [phone]   Fax:  [phone]  Physical Therapy Treatment  Patient Details  Name: Kendra Nolan MRN: [id number] Date of Birth: September 08, 1990 Referring Provider (PT): Dr Benjamin Stain   Encounter Date: 07/04/2020   PT End of Session - 07/04/20 1449    Visit Number 3    Number of Visits 12    Date for PT Re-Evaluation 07/29/20    PT Start Time 1404    PT Stop Time 1444    PT Time Calculation (min) 40 min    Activity Tolerance Patient tolerated treatment well    Behavior During Therapy [facility] for tasks assessed/performed           Past Medical History:  Diagnosis Date  . GERD (gastroesophageal reflux disease)   . Migraine     Past Surgical History:  Procedure Laterality Date  . LAPAROSCOPIC GASTRIC SLEEVE RESECTION N/A 07/18/2018   Procedure: LAPAROSCOPIC GASTRIC SLEEVE RESECTION, UPPER ENDO, ERAS Pathway;  Surgeon: Berna Bue, MD;  Location: [facility];  Service: General;  Laterality: N/A;  . TONSILLECTOMY    . TONSILLECTOMY AND ADENOIDECTOMY      There were no vitals filed for this visit.   Subjective Assessment - 07/04/20 1411    Subjective Pt reports she has been completing her exercises; the adductor stretch "feels good".  Has some relief with self ball release to front of hip, but hasn't performed very often since last week. She continues to complain of pain in ant hips with transition from sit to stand.    Pertinent History laparoscopic gastric sleeve resection; arthritis bilat hips    Currently in Pain? Yes    Pain Score 6     Pain Location Hip    Pain Orientation Left;Right;Anterior    Pain Descriptors / Indicators Sharp    Aggravating Factors  sit to stand    Pain Relieving Factors stretches.              [facility] PT Assessment - 07/04/20 0001      Assessment   Medical Diagnosis Bilat hip pain     Referring Provider (PT) Dr  Benjamin Stain    Onset Date/Surgical Date 12/02/19    Hand Dominance Left    Next MD Visit 4 weeks after PT     Prior Therapy none       Strength   Strength Assessment Site Hip    Right/Left Hip Right;Left    Right Hip Extension 4-/5    Left Hip Flexion --    Left Hip Extension 5/5               [facility] Adult PT Treatment/Exercise - 07/04/20 0001      Self-Care   Self-Care Other Self-Care Comments    Other Self-Care Comments  reviewed MFR with ball to Rt ant hip in prone x 3 min       Knee/Hip Exercises: Stretches   Lobbyist Right;Left;2 reps;30 seconds   towel above knee   Hip Flexor Stretch Right;Left;2 reps;20 seconds   standing   Piriformis Stretch Right;Left;2 reps;30 seconds    Piriformis Stretch Limitations modified pigeon pose     Other Knee/Hip Stretches Standing hip adductor stretch x 3 reps of 20 sec, at counter.       Knee/Hip Exercises: Aerobic   Nustep L4: legs only, 5 min       Knee/Hip Exercises: Standing   Other Standing Knee  Exercises side stepping 20 ft Rt/Lt x 2 reps       Knee/Hip Exercises: Seated   Sit to Sand 1 set;5 reps;without UE support      Knee/Hip Exercises: Prone   Hip Extension Strengthening;Right;Left;1 set;10 reps    Hip Extension Limitations limited on RLE (LLE easy)    Other Prone Exercises trial of hip ext with knee flexed x 5 each leg (challenging on RLE)                       PT Long Term Goals - 07/04/20 1418      PT LONG TERM GOAL #1   Title Decrease pain with sit to stand with patient reporting 0-1/10 pain with sit to stand    Time 6    Period Weeks    Status On-going      PT LONG TERM GOAL #2   Title Improve hip extension with increased functional strength bilat LE's    Time 6    Period Weeks    Status On-going      PT LONG TERM GOAL #3   Title Patient to demonstrate and/or verbalize good body mechanics with ADL's and functional activities    Time 6    Period Weeks    Status On-going      PT  LONG TERM GOAL #4   Title Independent in HEP    Time 6    Period Weeks    Status On-going      PT LONG TERM GOAL #5   Title Improve FOTO to </= 22% limitation    Time 6    Period Weeks    Status On-going                 Plan - 07/04/20 1422    Clinical Impression Statement Pt now reporting improved sleep with less pain in bilat hips since initiating stretches.  Pain level with transitions has varied depending on work load during day.   Encouraged pt to evaluate workstation seat position and posture for possible contributing factors.  Rt hip remains tight anteriorly and is weak in hip ext.  Pt may benefit from DN / manual therapy to hip flexors/adductors in future session. Pt able to complete sit to stand without pain after stretches and self release with ball to Rt ant hip.  Progressing towards goals.    Rehab Potential Good    PT Frequency 2x / week    PT Duration 6 weeks    PT Treatment/Interventions ADLs/Self Care Home Management;Aquatic Therapy;Cryotherapy;Electrical Stimulation;Iontophoresis 4mg /ml Dexamethasone;Moist Heat;Ultrasound;Functional mobility training;Therapeutic activities;Therapeutic exercise;Neuromuscular re-education;Patient/family education;Manual techniques;Passive range of motion;Dry needling    PT Next Visit Plan progress with myofacial relese work; core stabilization; posterior hip strengthening; manual work and modalities as indicated    PT Home Exercise Plan    Consulted and Agree with Plan of Care Patient           Patient will benefit from skilled therapeutic intervention in order to improve the following deficits and impairments:  Abnormal gait, Decreased range of motion, Difficulty walking, Increased muscle spasms, Pain, Decreased activity tolerance, Hypomobility, Impaired flexibility, Improper body mechanics, Decreased strength, Postural dysfunction  Visit Diagnosis: Pain in left hip  Pain in right hip  Other symptoms and signs  involving the musculoskeletal system  Abnormal posture     Problem List Patient Active Problem List   Diagnosis Date Noted  . Bilateral hip pain 02/22/2020  . Acute maxillary sinusitis  04/18/2019  . Screening for hyperlipidemia 12/29/2018  . Infertility associated with anovulation 12/10/2015  . Irregular menstrual cycle 07/09/2015  . GERD (gastroesophageal reflux disease) 03/07/2015  . Plantar fasciitis, bilateral 05/14/2014  . Major depression 02/23/2013  . Preventive measure 11/16/2012  . Migraines 11/16/2012  . Obesity 11/16/2012  . Allergic rhinitis 11/16/2012   Mayer Camel, PTA 07/04/20 2:53 PM  [facility] [place] [address] [place], [place], [address] Phone: [phone]   Fax:  [phone]  Name: CAOIMHE DAMRON MRN: [id number] Date of Birth: [birth year]

## 2020-07-04 NOTE — Patient Instructions (Signed)

## 2020-07-11 ENCOUNTER — Other Ambulatory Visit: Payer: Self-pay

## 2020-07-11 ENCOUNTER — Ambulatory Visit (INDEPENDENT_AMBULATORY_CARE_PROVIDER_SITE_OTHER): Payer: BC Managed Care – PPO | Admitting: Physical Therapy

## 2020-07-11 ENCOUNTER — Encounter: Payer: Self-pay | Admitting: Physical Therapy

## 2020-07-11 DIAGNOSIS — R29898 Other symptoms and signs involving the musculoskeletal system: Secondary | ICD-10-CM | POA: Diagnosis not present

## 2020-07-11 DIAGNOSIS — M25551 Pain in right hip: Secondary | ICD-10-CM | POA: Diagnosis not present

## 2020-07-11 DIAGNOSIS — M25552 Pain in left hip: Secondary | ICD-10-CM

## 2020-07-11 DIAGNOSIS — R293 Abnormal posture: Secondary | ICD-10-CM

## 2020-07-11 NOTE — Patient Instructions (Signed)
Access Code: [email or web address] URL: [email or web address] Date: 07/11/2020 Prepared by: Raynelle Fanning  Exercises Prone Quad Stretch with [employer] and Strap - 1-2 x daily - 7 x weekly - 1 sets - 3 reps - 15-20 sec hold Seated Hip Abduction - 1 x daily - 7 x weekly - 1-2 sets - 10 reps Seated Transversus Abdominis Bracing - 2 x daily - 7 x weekly - 1 sets - 10 reps - 10 seconds hold Side Lunge with Counter Support - 2 x daily - 7 x weekly - 1 sets - 3 reps - 15-20 hold Standing Hip Extension - 4-5 x daily - 7 x weekly - 1 sets - 5 reps - 15-20 sec hold Sidestepping - 1 x daily - 7 x weekly - 1 sets - 2 reps Seated Table Piriformis Stretch - 1 x daily - 7 x weekly - 1 sets - 2-3 reps - 20-30 sec hold Active Hip Flexor Stretch - 2 x daily - 7 x weekly - 1 sets - 10 reps Quadruped Full Range Thoracic Rotation with Reach - 2 x daily - 7 x weekly - 1 sets - 10 reps  Patient Education Trigger Point Dry Needling

## 2020-07-11 NOTE — Therapy (Addendum)
[facility] [place], [place], [address] Phone: [phone]   Fax:  [phone]  Physical Therapy Treatment and Discharge Summary  Patient Details  Name: Kendra Nolan MRN: [id number] Date of Birth: 04-28-1991 Referring Provider (PT): Dr Dianah Field   Encounter Date: 07/11/2020   PT End of Session - 07/11/20 1102    Visit Number 4    Number of Visits 12    Date for PT Re-Evaluation 07/29/20    PT Start Time 1102    PT Stop Time 1154    PT Time Calculation (min) 52 min    Activity Tolerance Patient tolerated treatment well    Behavior During Therapy [facility] for tasks assessed/performed           Past Medical History:  Diagnosis Date  . GERD (gastroesophageal reflux disease)   . Migraine     Past Surgical History:  Procedure Laterality Date  . LAPAROSCOPIC GASTRIC SLEEVE RESECTION N/A 07/18/2018   Procedure: LAPAROSCOPIC GASTRIC SLEEVE RESECTION, UPPER ENDO, ERAS Pathway;  Surgeon: Clovis Riley, MD;  Location: [facility];  Service: General;  Laterality: N/A;  . TONSILLECTOMY    . TONSILLECTOMY AND ADENOIDECTOMY      There were no vitals filed for this visit.   Subjective Assessment - 07/11/20 1104    Subjective They're a little better.    Pertinent History laparoscopic gastric sleeve resection; arthritis bilat hips    Patient Stated Goals stand up from sitting without pain    Currently in Pain? No/denies                             [facility] Adult PT Treatment/Exercise - 07/11/20 0001      Knee/Hip Exercises: Stretches   Hip Flexor Stretch Both;2 reps;20 seconds    Hip Flexor Stretch Limitations 2nd set with heel raises x 10    Other Knee/Hip Stretches kneeling hip ADD stetch with reach through x 5; couldn't do on left due to left hip locking "feeling"    Other Knee/Hip Stretches standing forward flexion with trunk rotation x 5 ea      Knee/Hip Exercises: Aerobic   Nustep L4:  legs only, 5 min       Modalities   Modalities Moist Heat      Moist Heat Therapy   Number Minutes Moist Heat 10 Minutes    Moist Heat Location Hip   bil adductors     Manual Therapy   Manual Therapy Soft tissue mobilization    Manual therapy comments Skilled palpation and monitoring of soft tissues during DN    Soft tissue mobilization to bil hip ADDuctors            Trigger Point Dry Needling - 07/11/20 0001    Consent Given? Yes    Education Handout Provided Yes    Muscles Treated Lower Quadrant Adductor longus/brevis/magnus    Muscles Treated Back/Hip Gluteus minimus;Gluteus medius;Gluteus maximus;Piriformis    Dry Needling Comments bil ADDs; left only post hip    Adductor Response Twitch response elicited;Palpable increased muscle length    Gluteus Minimus Response Twitch response elicited;Palpable increased muscle length    Gluteus Medius Response Twitch response elicited;Palpable increased muscle length    Gluteus Maximus Response Twitch response elicited;Palpable increased muscle length    Piriformis Response Twitch response elicited;Palpable increased muscle length                PT  Education - 07/11/20 1301    Education Details HEP Progressed; DN education and aftercare    Person(s) Educated Patient    Methods Explanation;Demonstration;Handout    Comprehension Verbalized understanding;Returned demonstration               PT Long Term Goals - 07/04/20 1418      PT LONG TERM GOAL #1   Title Decrease pain with sit to stand with patient reporting 0-1/10 pain with sit to stand    Time 6    Period Weeks    Status On-going      PT LONG TERM GOAL #2   Title Improve hip extension with increased functional strength bilat LE's    Time 6    Period Weeks    Status On-going      PT LONG TERM GOAL #3   Title Patient to demonstrate and/or verbalize good body mechanics with ADL's and functional activities    Time 6    Period Weeks    Status On-going       PT LONG TERM GOAL #4   Title Independent in HEP    Time 6    Period Weeks    Status On-going      PT LONG TERM GOAL #5   Title Improve FOTO to </= 22% limitation    Time 6    Period Weeks    Status On-going                 Plan - 07/11/20 1301    Clinical Impression Statement Patient presents with no pain "yet" today but continued c/o pain with sit to stand. We added two stretches for her HF and adductors to HEP. Initial trial of DN went well in bil hip ADDuctors and left hip. Patient experiences locking up of hip on left with ER and had TPs in left piriformis and gluteals. LTGs are ongoing.    PT Frequency 2x / week    PT Duration 6 weeks    PT Treatment/Interventions ADLs/Self Care Home Management;Aquatic Therapy;Cryotherapy;Electrical Stimulation;Iontophoresis 64m/ml Dexamethasone;Moist Heat;Ultrasound;Functional mobility training;Therapeutic activities;Therapeutic exercise;Neuromuscular re-education;Patient/family education;Manual techniques;Passive range of motion;Dry needling    PT Next Visit Plan progress with myofacial relese work; core stabilization; posterior hip strengthening; manual work and modalities as indicated; assess DN; assess right hip flexor for DN (left didn't need)    PT [place] Agree with Plan of Care Patient           Patient will benefit from skilled therapeutic intervention in order to improve the following deficits and impairments:  Abnormal gait,Decreased range of motion,Difficulty walking,Increased muscle spasms,Pain,Decreased activity tolerance,Hypomobility,Impaired flexibility,Improper body mechanics,Decreased strength,Postural dysfunction  Visit Diagnosis: Pain in right hip  Pain in left hip  Other symptoms and signs involving the musculoskeletal system  Abnormal posture     Problem List Patient Active Problem List   Diagnosis Date Noted  . Bilateral hip pain 02/22/2020  . Acute maxillary  sinusitis 04/18/2019  . Screening for hyperlipidemia 12/29/2018  . Infertility associated with anovulation 12/10/2015  . Irregular menstrual cycle 07/09/2015  . GERD (gastroesophageal reflux disease) 03/07/2015  . Plantar fasciitis, bilateral 05/14/2014  . Major depression 02/23/2013  . Preventive measure 11/16/2012  . Migraines 11/16/2012  . Obesity 11/16/2012  . Allergic rhinitis 11/16/2012    JMadelyn FlavorsPT 07/11/2020, 1:10 PM  [facility] [address]Phone: [phone]  Fax:  [phone]  Name: VIRA CHAPLIN MRN: [id number] Date of Birth: March 25, 1991  PHYSICAL THERAPY DISCHARGE SUMMARY  Visits from Start of Care: 4  Current functional level related to goals / functional outcomes: unknown   Remaining deficits: unknown   Education / Equipment: HEP  Plan: Patient agrees to discharge.  Patient goals were not met. Patient is being discharged due to not returning since the last visit.  ?????    Madelyn Flavors, PT 09/02/20 11:10 AM  [facility] [place], [place] [address]  [phone] (office) [phone] (fax)

## 2020-07-18 ENCOUNTER — Encounter: Payer: BC Managed Care – PPO | Admitting: Physical Therapy

## 2020-07-31 DIAGNOSIS — U071 COVID-19: Secondary | ICD-10-CM

## 2020-07-31 HISTORY — DX: COVID-19: U07.1

## 2020-12-05 ENCOUNTER — Other Ambulatory Visit: Payer: Self-pay | Admitting: Sports Medicine

## 2020-12-05 DIAGNOSIS — F320 Major depressive disorder, single episode, mild: Secondary | ICD-10-CM

## 2021-02-14 ENCOUNTER — Encounter (HOSPITAL_COMMUNITY): Payer: Self-pay | Admitting: *Deleted

## 2021-04-09 ENCOUNTER — Ambulatory Visit (INDEPENDENT_AMBULATORY_CARE_PROVIDER_SITE_OTHER): Payer: BC Managed Care – PPO | Admitting: Obstetrics and Gynecology

## 2021-04-09 ENCOUNTER — Encounter: Payer: Self-pay | Admitting: Obstetrics and Gynecology

## 2021-04-09 ENCOUNTER — Other Ambulatory Visit (HOSPITAL_COMMUNITY)
Admission: RE | Admit: 2021-04-09 | Discharge: 2021-04-09 | Disposition: A | Discharge status: Home / Self Care | Payer: BC Managed Care – PPO | Source: Ambulatory Visit | Attending: Obstetrics and Gynecology | Admitting: Obstetrics and Gynecology

## 2021-04-09 ENCOUNTER — Other Ambulatory Visit: Payer: Self-pay

## 2021-04-09 VITALS — BP 113/78 | HR 81 | Ht 61.0 in | Wt 255.0 lb

## 2021-04-09 DIAGNOSIS — Z01419 Encounter for gynecological examination (general) (routine) without abnormal findings: Secondary | ICD-10-CM

## 2021-04-09 NOTE — Progress Notes (Signed)
GYNECOLOGY ANNUAL PREVENTATIVE CARE ENCOUNTER NOTE  History:     Kendra Nolan is a 30 y.o. G77P1011 female here for a routine annual gynecologic exam.  Current complaints: none. Separated from partner and doing well.    Denies abnormal vaginal bleeding, discharge, pelvic pain, problems with intercourse or other gynecologic concerns.    Gynecologic History Patient's last menstrual period was 04/09/2021. Contraception: none Last Pap: 2019. Result was normal with negative HPV Last Mammogram: NA  Obstetric History OB History  Gravida Para Term Preterm AB Living  2 1 1   1 1   SAB IAB Ectopic Multiple Live Births  1     0 1    # Outcome Date GA Lbr Len/2nd Weight Sex Delivery Anes PTL Lv  2 Term 07/27/17 [redacted]w[redacted]d 04:21 / 01:15 7 lb 6.9 oz (3.37 kg) F Vag-Spont EPI  LIV  1 SAB             Past Medical History:  Diagnosis Date   GERD (gastroesophageal reflux disease)    Migraine     Past Surgical History:  Procedure Laterality Date   LAPAROSCOPIC GASTRIC SLEEVE RESECTION N/A 07/18/2018   Procedure: LAPAROSCOPIC GASTRIC SLEEVE RESECTION, UPPER ENDO, ERAS Pathway;  Surgeon: 07/20/2018, MD;  Location: [facility];  Service: General;  Laterality: N/A;   TONSILLECTOMY     TONSILLECTOMY AND ADENOIDECTOMY      Current Outpatient Medications on File Prior to Visit  Medication Sig Dispense Refill   venlafaxine XR (EFFEXOR-XR) 150 MG 24 hr capsule TAKE 1 CAPSULE (150 MG TOTAL) BY MOUTH AT BEDTIME. 90 capsule 1   ALPRAZolam (XANAX) 0.25 MG tablet Take 1 tablet (0.25 mg total) by mouth daily as needed for anxiety. (Patient not taking: Reported on 04/09/2021) 15 tablet 0   fluticasone (FLONASE) 50 MCG/ACT nasal spray One spray in each nostril twice a day, use left hand for right nostril, and right hand for left nostril. (Patient not taking: Reported on 04/09/2021) 48 g 3   meloxicam (MOBIC) 15 MG tablet One tab PO qAM with a meal for 2 weeks, then daily prn pain. (Patient not taking: Reported  on 04/09/2021) 30 tablet 3   Multiple Vitamin (MULTIVITAMIN WITH MINERALS) TABS tablet Take 1 tablet by mouth daily. (Patient not taking: Reported on 04/09/2021)     Norethindrone-Ethinyl Estradiol-Fe Biphas (LO LOESTRIN FE) 1 MG-10 MCG / 10 MCG tablet Take 1 tablet by mouth daily. (Patient not taking: Reported on 04/09/2021) 28 tablet 11   pantoprazole (PROTONIX) 40 MG tablet Take 40 mg by mouth daily. (Patient not taking: Reported on 04/09/2021)     phentermine (ADIPEX-P) 37.5 MG tablet Take 37.5 mg by mouth daily. Take half tab daily (Patient not taking: Reported on 04/09/2021)     traMADol (ULTRAM) 50 MG tablet Take 1-2 tablets (50-100 mg total) by mouth every 8 (eight) hours as needed for moderate pain. Maximum 6 tabs per day. (Patient not taking: Reported on 04/09/2021) 21 tablet 0   No current facility-administered medications on file prior to visit.    Allergies  Allergen Reactions   Codeine Hives    Codeine cough syrup   Clarithromycin Hives   Penicillins Hives    Has patient had a PCN reaction causing immediate rash, facial/tongue/throat swelling, SOB or lightheadedness with hypotension: Yes Has patient had a PCN reaction causing severe rash involving mucus membranes or skin necrosis: Yes Has patient had a PCN reaction that required hospitalization: No Has patient had a PCN  reaction occurring within the last 10 years: No If all of the above answers are "NO", then may proceed with Cephalosporin use.     Social History:  reports that she has never smoked. She has never used smokeless tobacco. She reports that she does not drink alcohol and does not use drugs.  Family History  Problem Relation Age of Onset   Cancer Maternal Aunt    Hypertension Mother    Hyperlipidemia Father    Hyperlipidemia Maternal Grandmother    Hyperlipidemia Maternal Grandfather    Hypertension Maternal Grandfather    Hyperlipidemia Paternal Grandmother    Hyperlipidemia Paternal Grandfather    Asthma Other     COPD Other     The following portions of the patient's history were reviewed and updated as appropriate: allergies, current medications, past family history, past medical history, past social history, past surgical history and problem list.  Review of Systems Pertinent items noted in HPI and remainder of comprehensive ROS otherwise negative.  Physical Exam:  BP 113/78   Pulse 81   Ht 5\' 1"  (1.549 m)   Wt 255 lb (115.7 kg)   LMP 04/09/2021   BMI 48.18 kg/m  CONSTITUTIONAL: Well-developed, well-nourished female in no acute distress.  HENT:  Normocephalic, atraumatic, External right and left ear normal.  EYES: Conjunctivae and EOM are normal. Pupils are equal, round, and reactive to light. No scleral icterus.  NECK: Normal range of motion, supple, no masses.  Normal thyroid.  SKIN: Skin is warm and dry. No rash noted. Not diaphoretic. No erythema. No pallor. MUSCULOSKELETAL: Normal range of motion. No tenderness.  No cyanosis, clubbing, or edema. NEUROLOGIC: Alert and oriented to person, place, and time. Normal reflexes, muscle tone coordination.  PSYCHIATRIC: Normal mood and affect. Normal behavior. Normal judgment and thought content. CARDIOVASCULAR: Normal heart rate noted, regular rhythm RESPIRATORY: Clear to auscultation bilaterally. Effort and breath sounds normal, no problems with respiration noted. BREASTS: Symmetric in size. No masses, tenderness, skin changes, nipple drainage, or lymphadenopathy bilaterally. Performed in the presence of a chaperone. ABDOMEN: Soft, no distention noted.  No tenderness, rebound or guarding.  PELVIC: Normal appearing external genitalia and urethral meatus; normal appearing vaginal mucosa and cervix.  No abnormal vaginal discharge noted.  Pap smear obtained.  Normal uterine size, no other palpable masses, no uterine or adnexal tenderness.  Performed in the presence of a chaperone.   Assessment and Plan:   1. Well woman exam  - Cytology - PAP( [facility])   Will follow up results of pap smear and manage accordingly. Routine preventative health maintenance measures emphasized. Please refer to After Visit Summary for other counseling recommendations.    Renold Kozar, 06/09/2021, NP [facility], [facility]

## 2021-04-14 LAB — CYTOLOGY - PAP
Comment: NEGATIVE
Diagnosis: NEGATIVE
High risk HPV: NEGATIVE

## 2021-06-11 ENCOUNTER — Other Ambulatory Visit: Payer: Self-pay | Admitting: Sports Medicine

## 2021-06-11 DIAGNOSIS — F320 Major depressive disorder, single episode, mild: Secondary | ICD-10-CM

## 2021-06-13 ENCOUNTER — Ambulatory Visit (INDEPENDENT_AMBULATORY_CARE_PROVIDER_SITE_OTHER): Payer: BC Managed Care – PPO | Admitting: Sports Medicine

## 2021-06-13 ENCOUNTER — Other Ambulatory Visit: Payer: Self-pay

## 2021-06-13 DIAGNOSIS — Z13220 Encounter for screening for lipoid disorders: Secondary | ICD-10-CM | POA: Diagnosis not present

## 2021-06-13 DIAGNOSIS — F320 Major depressive disorder, single episode, mild: Secondary | ICD-10-CM

## 2021-06-13 MED ORDER — VENLAFAXINE HCL ER 37.5 MG PO CP24: ORAL_CAPSULE | ORAL | 0 refills | Status: DC

## 2021-06-13 NOTE — Assessment & Plan Note (Signed)
Ordering routine labs, patient will return fasting for this.

## 2021-06-13 NOTE — Assessment & Plan Note (Signed)
This pleasant 30 year old female has come off of the majority of her medications, she is looking to taper down off of Effexor, she is currently going through a divorce, and a lot of her stress is gone now that she and her husband have separated. We will do 37.5 mg tablets, 3 tabs daily for a week, 2 tabs daily for a week, then 1 tab daily for a week, then stop. I am happy to add an occasional alprazolam if needed in the future.

## 2021-06-13 NOTE — Progress Notes (Signed)
    Procedures performed today:    None.  Independent interpretation of notes and tests performed by another provider:   None.  Brief History, Exam, Impression, and Recommendations:    Major depression This pleasant 30 year old female has come off of the majority of her medications, she is looking to taper down off of Effexor, she is currently going through a divorce, and a lot of her stress is gone now that she and her husband have separated. We will do 37.5 mg tablets, 3 tabs daily for a week, 2 tabs daily for a week, then 1 tab daily for a week, then stop. I am happy to add an occasional alprazolam if needed in the future.  Screening for hyperlipidemia Ordering routine labs, patient will return fasting for this.    ___________________________________________ Ihor Austin. Benjamin Stain, M.D., ABFM., CAQSM. Primary Care and Sports Medicine [facility]  Adjunct Instructor of Family Medicine  [employer]

## 2021-07-01 ENCOUNTER — Other Ambulatory Visit: Payer: Self-pay | Admitting: Sports Medicine

## 2021-07-01 DIAGNOSIS — F320 Major depressive disorder, single episode, mild: Secondary | ICD-10-CM

## 2021-07-10 ENCOUNTER — Ambulatory Visit: Payer: BC Managed Care – PPO | Admitting: Obstetrics and Gynecology

## 2021-07-14 ENCOUNTER — Emergency Department (INDEPENDENT_AMBULATORY_CARE_PROVIDER_SITE_OTHER)
Admission: RE | Admit: 2021-07-14 | Discharge: 2021-07-14 | Disposition: A | Discharge status: Home / Self Care | Payer: BC Managed Care – PPO | Source: Ambulatory Visit

## 2021-07-14 ENCOUNTER — Other Ambulatory Visit: Payer: Self-pay

## 2021-07-14 VITALS — BP 106/75 | HR 94 | Temp 100.2°F | Resp 16 | Ht 61.0 in | Wt 241.0 lb

## 2021-07-14 DIAGNOSIS — R6889 Other general symptoms and signs: Secondary | ICD-10-CM

## 2021-07-14 DIAGNOSIS — R509 Fever, unspecified: Secondary | ICD-10-CM

## 2021-07-14 DIAGNOSIS — R059 Cough, unspecified: Secondary | ICD-10-CM

## 2021-07-14 LAB — POCT INFLUENZA A/B
Influenza A, POC: NEGATIVE
Influenza B, POC: NEGATIVE

## 2021-07-14 MED ORDER — BENZONATATE 200 MG PO CAPS: 200.0000 mg | ORAL_CAPSULE | Freq: Three times a day (TID) | ORAL | 0 refills | Status: AC | PRN

## 2021-07-14 MED ORDER — OSELTAMIVIR PHOSPHATE 75 MG PO CAPS: 75.0000 mg | ORAL_CAPSULE | Freq: Two times a day (BID) | ORAL | 0 refills | Status: DC

## 2021-07-14 MED ORDER — ACETAMINOPHEN 500 MG PO TABS
1000.0000 mg | ORAL_TABLET | Freq: Once | ORAL | Status: AC
  Administered 2021-07-14: 1000 mg via ORAL

## 2021-07-14 NOTE — ED Triage Notes (Signed)
Daughter tested Positive this weekend with Flu-A this weekend, pt needs to test for work. Unvaccinated for Covid and Flu

## 2021-07-14 NOTE — ED Provider Notes (Signed)
[facility]    CSN: [id number] Arrival date & time: 07/14/21  1400      History   Chief Complaint Chief Complaint  Patient presents with   Appt-Exposure to influenza    HPI Kendra Nolan is a 30 y.o. female.   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  Reports that her daughter tested positive this past weekend and needs test performed for work note.  Patient presents with fever and generalized body aches for 2-3 days.  Past Medical History:  Diagnosis Date   GERD (gastroesophageal reflux disease)    Migraine     Patient Active Problem List   Diagnosis Date Noted   Bilateral hip pain 02/22/2020   Screening for hyperlipidemia 12/29/2018   Infertility associated with anovulation 12/10/2015   Irregular menstrual cycle 07/09/2015   GERD (gastroesophageal reflux disease) 03/07/2015   Plantar fasciitis, bilateral 05/14/2014   Major depression 02/23/2013   Preventive measure 11/16/2012   Migraines 11/16/2012   Obesity 11/16/2012   Allergic rhinitis 11/16/2012    Past Surgical History:  Procedure Laterality Date   LAPAROSCOPIC GASTRIC SLEEVE RESECTION N/A 07/18/2018   Procedure: LAPAROSCOPIC GASTRIC SLEEVE RESECTION, UPPER ENDO, ERAS Pathway;  Surgeon: Clovis Riley, MD;  Location: [facility];  Service: General;  Laterality: N/A;   TONSILLECTOMY     TONSILLECTOMY AND ADENOIDECTOMY      OB History     Gravida  2   Para  1   Term  1   Preterm      AB  1   Living  1      SAB  1   IAB      Ectopic      Multiple  0   Live Births  1            Home Medications    Prior to Admission medications   Medication Sig Start Date End Date Taking? Authorizing Provider  benzonatate (TESSALON) 200 MG capsule Take 1 capsule (200 mg total) by mouth 3 (three) times daily as needed for up to 7 days for cough. 07/14/21 07/21/21 Yes Eliezer Lofts, FNP  oseltamivir (TAMIFLU) 75 MG capsule Take 1 capsule (75  mg total) by mouth every 12 (twelve) hours. 07/14/21  Yes Eliezer Lofts, FNP    Family History Family History  Problem Relation Age of Onset   Cancer Maternal Aunt    Hypertension Mother    Hyperlipidemia Father    Hyperlipidemia Maternal Grandmother    Hyperlipidemia Maternal Grandfather    Hypertension Maternal Grandfather    Hyperlipidemia Paternal Grandmother    Hyperlipidemia Paternal Grandfather    Asthma Other    COPD Other     Social History Social History   Tobacco Use   Smoking status: Never   Smokeless tobacco: Never  Vaping Use   Vaping Use: Never used  Substance Use Topics   Alcohol use: No   Drug use: No     Allergies   Codeine, Clarithromycin, and Penicillins   Review of Systems Review of Systems  Constitutional:  Positive for fever.  HENT:  Positive for congestion.   Musculoskeletal:  Positive for myalgias.  All other systems reviewed and are negative.   Physical Exam Triage Vital Signs ED Triage Vitals [07/14/21 1440]  Enc Vitals Group     BP 106/75     Pulse Rate 94     Resp 16     Temp 100.2 F (37.9 C)  Temp Source Oral     SpO2 96 %     Weight 241 lb (109.3 kg)     Height 5\' 1"  (1.549 m)     Head Circumference      Peak Flow      Pain Score 0     Pain Loc      Pain Edu?      Excl. in GC?    No data found.  Updated Vital Signs BP 106/75 (BP Location: Left Arm)   Pulse 94   Temp 100.2 F (37.9 C) (Oral)   Resp 16   Ht 5\' 1"  (1.549 m)   Wt 241 lb (109.3 kg)   SpO2 96%   BMI 45.54 kg/m     Physical Exam Vitals and nursing note reviewed.  Constitutional:      General: She is not in acute distress.    Appearance: Normal appearance. She is obese. She is not ill-appearing.  HENT:     Head: Normocephalic and atraumatic.     Right Ear: Tympanic membrane, ear canal and external ear normal.     Left Ear: Tympanic membrane, ear canal and external ear normal.     Mouth/Throat:     Mouth: Mucous membranes are moist.      Pharynx: Oropharynx is clear.  Eyes:     Extraocular Movements: Extraocular movements intact.     Conjunctiva/sclera: Conjunctivae normal.     Pupils: Pupils are equal, round, and reactive to light.  Cardiovascular:     Rate and Rhythm: Normal rate and regular rhythm.     Pulses: Normal pulses.     Heart sounds: Normal heart sounds.  Pulmonary:     Effort: Pulmonary effort is normal.     Breath sounds: Normal breath sounds.  Musculoskeletal:     Cervical back: Normal range of motion and neck supple. No tenderness.  Lymphadenopathy:     Cervical: No cervical adenopathy.  Skin:    General: Skin is warm.  Neurological:     General: No focal deficit present.     Mental Status: She is alert and oriented to person, place, and time. Mental status is at baseline.     UC Treatments / Results  Labs (all labs ordered are listed, but only abnormal results are displayed) Labs Reviewed  POCT INFLUENZA A/B    EKG   Radiology No results found.  Procedures Procedures (including critical care time)  Medications Ordered in UC Medications  acetaminophen (TYLENOL) tablet 1,000 mg (1,000 mg Oral Given 07/14/21 1628)    Initial Impression / Assessment and Plan / UC Course  I have reviewed the triage vital signs and the nursing notes.  Pertinent labs & imaging results that were available during my care of the patient were reviewed by me and considered in my medical decision making (see chart for details).     MDM: 1.  Flulike symptoms-influenza A negative we will treat empirically with Tamiflu; 2.  Cough-Rx'd Tessalon Perles: 3.  Fever-1000 mg Tylenol given once in clinic prior to discharge today. Advised patient influenza was negative; however, will treat empirically with Tamiflu.  Advised patient to take medication as directed with food to completion.  Advised patient may take Tessalon Perles daily or as needed for cough.  Advised patient may alternate between OTC Tylenol 1000 mg  1-2 times daily, as needed with OTC Ibuprofen 600 to 800 mg 1-2 times daily as needed for fever and myalgias.  Encouraged patient to increase daily water intake  while taking these medications.  Work note provided prior to discharge per patient request.  Patient discharged home, hemodynamically stable. Final Clinical Impressions(s) / UC Diagnoses   Final diagnoses:  Flu-like symptoms  Cough, unspecified type  Fever, unspecified     Discharge Instructions      Advised patient influenza was negative; however, will treat empirically with Tamiflu.  Advised patient to take medication as directed with food to completion.  Advised patient may take Tessalon Perles daily or as needed for cough.  Advised patient may alternate between OTC Tylenol 1000 mg 1-2 times daily, as needed with OTC Ibuprofen 600 to 800 mg 1-2 times daily as needed for fever and myalgias.  Encouraged patient to increase daily water intake while taking these medications.     ED Prescriptions     Medication Sig Dispense Auth. Provider   oseltamivir (TAMIFLU) 75 MG capsule Take 1 capsule (75 mg total) by mouth every 12 (twelve) hours. 10 capsule Trevor Iha, FNP   benzonatate (TESSALON) 200 MG capsule Take 1 capsule (200 mg total) by mouth 3 (three) times daily as needed for up to 7 days for cough. 40 capsule Trevor Iha, FNP      PDMP not reviewed this encounter.   Trevor Iha, FNP 07/14/21 [phone]

## 2021-07-14 NOTE — Discharge Instructions (Addendum)
Advised patient influenza was negative; however, will treat empirically with Tamiflu.  Advised patient to take medication as directed with food to completion.  Advised patient may take Tessalon Perles daily or as needed for cough.  Advised patient may alternate between OTC Tylenol 1000 mg 1-2 times daily, as needed with OTC Ibuprofen 600 to 800 mg 1-2 times daily as needed for fever and myalgias.  Encouraged patient to increase daily water intake while taking these medications.

## 2021-07-15 ENCOUNTER — Ambulatory Visit: Payer: BC Managed Care – PPO | Admitting: Sports Medicine

## 2021-08-14 ENCOUNTER — Ambulatory Visit: Payer: BC Managed Care – PPO | Admitting: Obstetrics and Gynecology

## 2021-08-14 DIAGNOSIS — F419 Anxiety disorder, unspecified: Secondary | ICD-10-CM | POA: Insufficient documentation

## 2021-08-14 NOTE — Progress Notes (Deleted)
° °  GYNECOLOGY OFFICE VISIT NOTE  History:   Kendra Nolan is a 31 y.o. G2P1011 here today for consultation for permanent sterilization.   She has one child.   For birth control she has tried OCPs in the past.   She denies any abnormal vaginal discharge, bleeding, pelvic pain or other concerns.     Past Medical History:  Diagnosis Date   GERD (gastroesophageal reflux disease)    Migraine     Past Surgical History:  Procedure Laterality Date   LAPAROSCOPIC GASTRIC SLEEVE RESECTION N/A 07/18/2018   Procedure: LAPAROSCOPIC GASTRIC SLEEVE RESECTION, UPPER ENDO, ERAS Pathway;  Surgeon: Berna Bue, MD;  Location: [facility];  Service: General;  Laterality: N/A;   TONSILLECTOMY     TONSILLECTOMY AND ADENOIDECTOMY      The following portions of the patient's history were reviewed and updated as appropriate: allergies, current medications, past family history, past medical history, past social history, past surgical history and problem list.   Health Maintenance:    Diagnosis  Date Value Ref Range Status  04/09/2021   Final   - Negative for intraepithelial lesion or malignancy (NILM)     Review of Systems:  Pertinent items noted in HPI and remainder of comprehensive ROS otherwise negative.  Physical Exam:  There were no vitals taken for this visit. CONSTITUTIONAL: Well-developed, well-nourished female in no acute distress.  HEENT:  Normocephalic, atraumatic. External right and left ear normal. No scleral icterus.  NECK: Normal range of motion, supple, no masses noted on observation SKIN: No rash noted. Not diaphoretic. No erythema. No pallor. MUSCULOSKELETAL: Normal range of motion. No edema noted. NEUROLOGIC: Alert and oriented to person, place, and time. Normal muscle tone coordination. No cranial nerve deficit noted. PSYCHIATRIC: Normal mood and affect. Normal behavior. Normal judgment and thought content.  CARDIOVASCULAR: Normal heart rate noted RESPIRATORY: Effort  and breath sounds normal, no problems with respiration noted ABDOMEN: No masses noted. No other overt distention noted.    PELVIC: Deferred  Labs and Imaging No results found for this or any previous visit (from the past 168 hour(s)). No results found.  Assessment and Plan:    1. Sterilization consult ***   Routine preventative health maintenance measures emphasized. Please refer to After Visit Summary for other counseling recommendations.   No follow-ups on file.  Milas Hock, MD, FACOG Obstetrician & Gynecologist, [facility] for [facility], [facility]

## 2021-09-04 ENCOUNTER — Other Ambulatory Visit: Payer: Self-pay

## 2021-09-04 ENCOUNTER — Ambulatory Visit: Payer: BC Managed Care – PPO | Admitting: Obstetrics and Gynecology

## 2021-09-04 ENCOUNTER — Encounter: Payer: Self-pay | Admitting: Obstetrics and Gynecology

## 2021-09-04 VITALS — BP 122/79 | HR 123 | Resp 16 | Ht 61.0 in | Wt 223.0 lb

## 2021-09-04 DIAGNOSIS — Z3009 Encounter for other general counseling and advice on contraception: Secondary | ICD-10-CM

## 2021-09-04 NOTE — Progress Notes (Signed)
° °  GYNECOLOGY OFFICE VISIT NOTE  History:   Kendra Nolan is a 31 y.o. G2P1011 here today for sterilization consult. She desires no more children or pregnancies for any reason under any circumstance.   She is not currently using anything for birth control. She is not currently sexually active.   She does not even want another child if she were to get back with her husband or with a new partner.   She reports "my life is complete" with her 37 yo daughter Kendra Nolan.   She denies any abnormal vaginal discharge, bleeding, pelvic pain or other concerns.     Past Medical History:  Diagnosis Date   GERD (gastroesophageal reflux disease)    Migraine     Past Surgical History:  Procedure Laterality Date   LAPAROSCOPIC GASTRIC SLEEVE RESECTION N/A 07/18/2018   Procedure: LAPAROSCOPIC GASTRIC SLEEVE RESECTION, UPPER ENDO, ERAS Pathway;  Surgeon: Berna Bue, MD;  Location: [facility];  Service: General;  Laterality: N/A;   TONSILLECTOMY     TONSILLECTOMY AND ADENOIDECTOMY      The following portions of the patient's history were reviewed and updated as appropriate: allergies, current medications, past family history, past medical history, past social history, past surgical history and problem list.   Health Maintenance:   Normal pap and negative HRHPV on 04/2021.   Diagnosis  Date Value Ref Range Status  04/09/2021   Final   - Negative for intraepithelial lesion or malignancy (NILM)    Review of Systems:  Pertinent items noted in HPI and remainder of comprehensive ROS otherwise negative.  Physical Exam:  BP 122/79    Pulse (!) 123    Resp 16    Ht 5\' 1"  (1.549 m)    Wt 223 lb (101.2 kg)    LMP 08/20/2021    BMI 42.14 kg/m  CONSTITUTIONAL: Well-developed, well-nourished female in no acute distress.  HEENT:  Normocephalic, atraumatic. External right and left ear normal. No scleral icterus.  NECK: Normal range of motion, supple, no masses noted on observation SKIN: No rash noted.  Not diaphoretic. No erythema. No pallor. MUSCULOSKELETAL: Normal range of motion. No edema noted. NEUROLOGIC: Alert and oriented to person, place, and time. Normal muscle tone coordination. No cranial nerve deficit noted. PSYCHIATRIC: Normal mood and affect. Normal behavior. Normal judgment and thought content.  CARDIOVASCULAR: Normal heart rate noted RESPIRATORY: Effort and breath sounds normal, no problems with respiration noted ABDOMEN: No masses noted. No other overt distention noted.    PELVIC: Deferred  Labs and Imaging No results found for this or any previous visit (from the past 168 hour(s)). No results found.  Assessment and Plan:  Epifania was seen today for discuss bts.  Diagnoses and all orders for this visit:  Sterilization consult  - She desires permanent sterilization. Discussed alternatives including LARC options and vasectomy. She declines these options.  - Discussed surgery of salpingectomy. Discussed no option for reversal and only way to achieve pregnancy is to do IVF should she change her mind in the future.  - Discussed risks: bleeding, infection, injury to surrounding organs/tissues, possible need for open surgery - Reviewed restrictions and recovery following surgery   Routine preventative health maintenance measures emphasized. Please refer to After Visit Summary for other counseling recommendations.   No follow-ups on file.  Luther Parody, MD, FACOG Obstetrician & Gynecologist, [facility] for [facility], [facility]

## 2021-09-10 NOTE — H&P (Signed)
Kendra Nolan is an 31 y.o. female. [id number] here today sterilization. She desires no more children or pregnancies for any reason under any circumstance.    She is not currently using anything for birth control. She is not currently sexually active.    She does not even want another child if she were to get back with her husband or with a new partner.   She reports "my life is complete" with her 40 yo daughter Kendra Nolan  Pertinent Gynecological History: Menses: regular Contraception: none Blood transfusions: none Sexually transmitted diseases: no past history Previous GYN Procedures:  None   Last pap: normal Date: 04/2021 OB History: G2, P1011   Menstrual History: Patient's last menstrual period was 08/20/2021.    Past Medical History:  Diagnosis Date   GERD (gastroesophageal reflux disease)    Migraine     Past Surgical History:  Procedure Laterality Date   LAPAROSCOPIC GASTRIC SLEEVE RESECTION N/A 07/18/2018   Procedure: LAPAROSCOPIC GASTRIC SLEEVE RESECTION, UPPER ENDO, ERAS Pathway;  Surgeon: Berna Bue, MD;  Location: [facility];  Service: General;  Laterality: N/A;   TONSILLECTOMY     TONSILLECTOMY AND ADENOIDECTOMY      Family History  Problem Relation Age of Onset   Cancer Maternal Aunt    Hypertension Mother    Hyperlipidemia Father    Hyperlipidemia Maternal Grandmother    Hyperlipidemia Maternal Grandfather    Hypertension Maternal Grandfather    Hyperlipidemia Paternal Grandmother    Hyperlipidemia Paternal Grandfather    Asthma Other    COPD Other     Social History:  reports that she has never smoked. She has never used smokeless tobacco. She reports that she does not drink alcohol and does not use drugs.  Allergies:  Allergies  Allergen Reactions   Codeine Hives    Codeine cough syrup   Clarithromycin Hives   Penicillins Hives    Has patient had a PCN reaction causing immediate rash, facial/tongue/throat swelling, SOB or lightheadedness with  hypotension: Yes Has patient had a PCN reaction causing severe rash involving mucus membranes or skin necrosis: Yes Has patient had a PCN reaction that required hospitalization: No Has patient had a PCN reaction occurring within the last 10 years: No If all of the above answers are "NO", then may proceed with Cephalosporin use.     No medications prior to admission.    Review of Systems  Constitutional: Negative.   HENT: Negative.    Eyes: Negative.   Respiratory: Negative.    Cardiovascular: Negative.   Gastrointestinal: Negative.   Endocrine: Negative.   Genitourinary: Negative.   Musculoskeletal: Negative.   Skin: Negative.   Allergic/Immunologic: Negative.   Neurological: Negative.   Hematological: Negative.   Psychiatric/Behavioral: Negative.     Last menstrual period 08/20/2021. Physical Exam Constitutional:      Appearance: She is obese.  HENT:     Head: Normocephalic and atraumatic.     Nose: Nose normal.     Mouth/Throat:     Mouth: Mucous membranes are moist.     Pharynx: Oropharynx is clear.  Eyes:     Extraocular Movements: Extraocular movements intact.     Conjunctiva/sclera: Conjunctivae normal.     Pupils: Pupils are equal, round, and reactive to light.  Cardiovascular:     Rate and Rhythm: Normal rate.  Pulmonary:     Effort: Pulmonary effort is normal.  Abdominal:     General: Abdomen is flat. Bowel sounds are normal.  Palpations: Abdomen is soft.  Musculoskeletal:        General: Normal range of motion.     Cervical back: Normal range of motion.  Skin:    General: Skin is warm.  Neurological:     General: No focal deficit present.     Mental Status: She is alert and oriented to person, place, and time. Mental status is at baseline.  Psychiatric:        Mood and Affect: Mood normal.        Behavior: Behavior normal.        Thought Content: Thought content normal.        Judgment: Judgment normal.    No results found for this or any  previous visit (from the past 24 hour(s)).  No results found.  Assessment/Plan: Unwanted fertility - She desires permanent sterilization. Discussed alternatives including LARC options and vasectomy. She declines these options.  - Discussed surgery of salpingectomy. Discussed no option for reversal and only way to achieve pregnancy is to do IVF should she change her mind in the future.  - Discussed risks: bleeding, infection, injury to surrounding organs/tissues, possible need for open surgery - Reviewed restrictions and recovery following   Milas Hock 09/10/2021, 3:19 PM

## 2021-09-17 ENCOUNTER — Encounter (HOSPITAL_BASED_OUTPATIENT_CLINIC_OR_DEPARTMENT_OTHER): Payer: Self-pay | Admitting: Obstetrics and Gynecology

## 2021-09-17 ENCOUNTER — Other Ambulatory Visit: Payer: Self-pay

## 2021-09-17 NOTE — Progress Notes (Addendum)
Spoke w/ via phone for pre-op interview---pt Lab needs dos----urine pregnancy POCT, type & screen              Lab results------none COVID test -----patient states asymptomatic no test needed Arrive at -------0830 on Tuesday, September 23, 2021 NPO after MN NO Solid Food.  Clear liquids from MN until---0730 Med rec completed Medications to take morning of surgery -----none Diabetic medication -----n/a Patient instructed no nail polish to be worn day of surgery Patient instructed to bring photo id and insurance card day of surgery Patient aware to have Driver (ride ) / caregiver    for 24 hours after surgery - mom Caren Patient Special Instructions -----Stop taking phentermine now (09/17/21). Pre-Op special Istructions -----Patient stated that she has a new nose piercing that she doesn't want to take out for surgery. I instructed her that if she did not take it out, she would have to sign a release the day of surgery. Patient verbalized understanding of instructions that were given at this phone interview. Patient denies shortness of breath, chest pain, fever, cough at this phone interview.

## 2021-09-23 ENCOUNTER — Encounter (HOSPITAL_BASED_OUTPATIENT_CLINIC_OR_DEPARTMENT_OTHER)
Admission: RE | Disposition: A | Discharge status: Home / Self Care | Payer: Self-pay | Source: Ambulatory Visit | Attending: Obstetrics and Gynecology

## 2021-09-23 ENCOUNTER — Encounter (HOSPITAL_BASED_OUTPATIENT_CLINIC_OR_DEPARTMENT_OTHER): Payer: Self-pay | Admitting: Obstetrics and Gynecology

## 2021-09-23 ENCOUNTER — Ambulatory Visit (HOSPITAL_BASED_OUTPATIENT_CLINIC_OR_DEPARTMENT_OTHER)
Admission: RE | Admit: 2021-09-23 | Discharge: 2021-09-23 | Disposition: A | Discharge status: Home / Self Care | Payer: BC Managed Care – PPO | Source: Ambulatory Visit | Attending: Obstetrics and Gynecology | Admitting: Obstetrics and Gynecology

## 2021-09-23 ENCOUNTER — Ambulatory Visit (HOSPITAL_BASED_OUTPATIENT_CLINIC_OR_DEPARTMENT_OTHER): Payer: BC Managed Care – PPO | Admitting: Anesthesiology

## 2021-09-23 ENCOUNTER — Other Ambulatory Visit: Payer: Self-pay

## 2021-09-23 DIAGNOSIS — Z9884 Bariatric surgery status: Secondary | ICD-10-CM | POA: Diagnosis not present

## 2021-09-23 DIAGNOSIS — K219 Gastro-esophageal reflux disease without esophagitis: Secondary | ICD-10-CM | POA: Insufficient documentation

## 2021-09-23 DIAGNOSIS — Z302 Encounter for sterilization: Secondary | ICD-10-CM | POA: Diagnosis not present

## 2021-09-23 DIAGNOSIS — Z3009 Encounter for other general counseling and advice on contraception: Secondary | ICD-10-CM

## 2021-09-23 HISTORY — DX: Anxiety disorder, unspecified: F41.9

## 2021-09-23 HISTORY — DX: Presence of spectacles and contact lenses: Z97.3

## 2021-09-23 HISTORY — DX: Depression, unspecified: F32.A

## 2021-09-23 HISTORY — PX: LAPAROSCOPIC BILATERAL SALPINGECTOMY: SHX5889

## 2021-09-23 LAB — TYPE AND SCREEN
ABO/RH(D): A POS
Antibody Screen: NEGATIVE

## 2021-09-23 LAB — POCT PREGNANCY, URINE: Preg Test, Ur: NEGATIVE

## 2021-09-23 SURGERY — SALPINGECTOMY, BILATERAL, LAPAROSCOPIC
Anesthesia: General | Site: Abdomen | Laterality: Bilateral

## 2021-09-23 MED ORDER — LACTATED RINGERS IV SOLN: INTRAVENOUS | Status: DC

## 2021-09-23 MED ORDER — FENTANYL CITRATE (PF) 100 MCG/2ML IJ SOLN
INTRAMUSCULAR | Status: AC
  Filled 2021-09-23: qty 2

## 2021-09-23 MED ORDER — DEXAMETHASONE SODIUM PHOSPHATE 10 MG/ML IJ SOLN
INTRAMUSCULAR | Status: DC | PRN
  Administered 2021-09-23: 10 mg via INTRAVENOUS

## 2021-09-23 MED ORDER — PROMETHAZINE HCL 25 MG/ML IJ SOLN: 6.2500 mg | INTRAMUSCULAR | Status: DC | PRN

## 2021-09-23 MED ORDER — KETOROLAC TROMETHAMINE 15 MG/ML IJ SOLN: 15.0000 mg | INTRAMUSCULAR | Status: DC

## 2021-09-23 MED ORDER — SUGAMMADEX SODIUM 500 MG/5ML IV SOLN
INTRAVENOUS | Status: DC | PRN
  Administered 2021-09-23: 400 mg via INTRAVENOUS

## 2021-09-23 MED ORDER — KETOROLAC TROMETHAMINE 30 MG/ML IJ SOLN: 30.0000 mg | Freq: Once | INTRAMUSCULAR | Status: DC | PRN

## 2021-09-23 MED ORDER — BUPIVACAINE HCL (PF) 0.25 % IJ SOLN
INTRAMUSCULAR | Status: DC | PRN
  Administered 2021-09-23: 10 mL

## 2021-09-23 MED ORDER — IBUPROFEN 800 MG PO TABS
800.0000 mg | ORAL_TABLET | Freq: Three times a day (TID) | ORAL | 0 refills | Status: DC | PRN
Start: 2021-09-23 — End: 2022-05-28

## 2021-09-23 MED ORDER — ACETAMINOPHEN 500 MG PO TABS
1000.0000 mg | ORAL_TABLET | ORAL | Status: AC
  Administered 2021-09-23: 1000 mg via ORAL

## 2021-09-23 MED ORDER — ONDANSETRON HCL 4 MG/2ML IJ SOLN
INTRAMUSCULAR | Status: DC | PRN
  Administered 2021-09-23: 4 mg via INTRAVENOUS

## 2021-09-23 MED ORDER — LIDOCAINE HCL (CARDIAC) PF 100 MG/5ML IV SOSY
PREFILLED_SYRINGE | INTRAVENOUS | Status: DC | PRN
  Administered 2021-09-23: 50 mg via INTRAVENOUS

## 2021-09-23 MED ORDER — ONDANSETRON HCL 4 MG/2ML IJ SOLN
INTRAMUSCULAR | Status: AC
  Filled 2021-09-23: qty 2

## 2021-09-23 MED ORDER — ROCURONIUM BROMIDE 10 MG/ML (PF) SYRINGE
PREFILLED_SYRINGE | INTRAVENOUS | Status: AC
  Filled 2021-09-23: qty 10

## 2021-09-23 MED ORDER — 0.9 % SODIUM CHLORIDE (POUR BTL) OPTIME
TOPICAL | Status: DC | PRN
  Administered 2021-09-23: 500 mL

## 2021-09-23 MED ORDER — FENTANYL CITRATE (PF) 100 MCG/2ML IJ SOLN
INTRAMUSCULAR | Status: DC | PRN
  Administered 2021-09-23 (×2): 50 ug via INTRAVENOUS

## 2021-09-23 MED ORDER — MIDAZOLAM HCL 2 MG/2ML IJ SOLN
INTRAMUSCULAR | Status: AC
  Filled 2021-09-23: qty 2

## 2021-09-23 MED ORDER — PROPOFOL 10 MG/ML IV BOLUS
INTRAVENOUS | Status: AC
Start: 2021-09-23 — End: ?
  Filled 2021-09-23: qty 20

## 2021-09-23 MED ORDER — FENTANYL CITRATE (PF) 100 MCG/2ML IJ SOLN: 25.0000 ug | INTRAMUSCULAR | Status: DC | PRN

## 2021-09-23 MED ORDER — KETOROLAC TROMETHAMINE 30 MG/ML IJ SOLN
INTRAMUSCULAR | Status: AC
  Filled 2021-09-23: qty 1

## 2021-09-23 MED ORDER — MIDAZOLAM HCL 5 MG/5ML IJ SOLN
INTRAMUSCULAR | Status: DC | PRN
  Administered 2021-09-23: 2 mg via INTRAVENOUS

## 2021-09-23 MED ORDER — KETOROLAC TROMETHAMINE 30 MG/ML IJ SOLN
INTRAMUSCULAR | Status: DC | PRN
  Administered 2021-09-23: 30 mg via INTRAVENOUS

## 2021-09-23 MED ORDER — POVIDONE-IODINE 10 % EX SWAB: 2.0000 "application " | Freq: Once | CUTANEOUS | Status: DC

## 2021-09-23 MED ORDER — OXYCODONE HCL 5 MG PO TABS: 5.0000 mg | ORAL_TABLET | ORAL | 0 refills | Status: DC | PRN

## 2021-09-23 MED ORDER — ROCURONIUM BROMIDE 100 MG/10ML IV SOLN
INTRAVENOUS | Status: DC | PRN
Start: 2021-09-23 — End: 2021-09-23
  Administered 2021-09-23: 60 mg via INTRAVENOUS

## 2021-09-23 MED ORDER — DEXAMETHASONE SODIUM PHOSPHATE 10 MG/ML IJ SOLN
INTRAMUSCULAR | Status: AC
  Filled 2021-09-23: qty 1

## 2021-09-23 MED ORDER — LIDOCAINE HCL (PF) 2 % IJ SOLN
INTRAMUSCULAR | Status: AC
  Filled 2021-09-23: qty 5

## 2021-09-23 MED ORDER — PROPOFOL 10 MG/ML IV BOLUS
INTRAVENOUS | Status: DC | PRN
Start: 2021-09-23 — End: 2021-09-23
  Administered 2021-09-23: 200 mg via INTRAVENOUS

## 2021-09-23 MED ORDER — ACETAMINOPHEN 500 MG PO TABS
ORAL_TABLET | ORAL | Status: AC
  Filled 2021-09-23: qty 2

## 2021-09-23 SURGICAL SUPPLY — 28 items
ADH SKN CLS APL DERMABOND .7 (GAUZE/BANDAGES/DRESSINGS) ×1
APL SWBSTK 6 STRL LF DISP (MISCELLANEOUS) ×2
APPLICATOR COTTON TIP 6 STRL (MISCELLANEOUS) IMPLANT
APPLICATOR COTTON TIP 6IN STRL (MISCELLANEOUS) ×4
CATH ROBINSON RED A/P 16FR (CATHETERS) ×2 IMPLANT
DERMABOND ADVANCED (GAUZE/BANDAGES/DRESSINGS) ×1
DERMABOND ADVANCED .7 DNX12 (GAUZE/BANDAGES/DRESSINGS) ×1 IMPLANT
DURAPREP 26ML APPLICATOR (WOUND CARE) ×2 IMPLANT
GLOVE SURG ENC MOIS LTX SZ6 (GLOVE) ×2 IMPLANT
GOWN STRL REUS W/ TWL LRG LVL3 (GOWN DISPOSABLE) ×1 IMPLANT
GOWN STRL REUS W/TWL LRG LVL3 (GOWN DISPOSABLE) ×2
IRRIGATION STRYKERFLOW (MISCELLANEOUS) IMPLANT
IRRIGATOR STRYKERFLOW (MISCELLANEOUS)
KIT TURNOVER CYSTO (KITS) ×2 IMPLANT
NDL INSUFFLATION 14GA 120MM (NEEDLE) ×1 IMPLANT
NEEDLE INSUFFLATION 14GA 120MM (NEEDLE) ×2 IMPLANT
PACK LAPAROSCOPY BASIN (CUSTOM PROCEDURE TRAY) ×2 IMPLANT
PACK TRENDGUARD 450 HYBRID PRO (MISCELLANEOUS) ×1 IMPLANT
SEALER TISSUE G2 CVD JAW 35 (ENDOMECHANICALS) ×1 IMPLANT
SEALER TISSUE G2 CVD JAW 45CM (ENDOMECHANICALS) ×1
SUT VICRYL 4-0 PS2 18IN ABS (SUTURE) ×2 IMPLANT
TOWEL OR 17X26 10 PK STRL BLUE (TOWEL DISPOSABLE) ×2 IMPLANT
TRENDGUARD 450 HYBRID PRO PACK (MISCELLANEOUS) ×2
TROCAR BLADELESS OPT 5 100 (ENDOMECHANICALS) ×1 IMPLANT
TROCAR KII 8X100ML NONTHREADED (TROCAR) ×1 IMPLANT
TROCAR XCEL NON BLADE 8MM B8LT (ENDOMECHANICALS) ×2 IMPLANT
TROCAR XCEL NON-BLD 5MMX100MML (ENDOMECHANICALS) ×2 IMPLANT
WARMER LAPAROSCOPE (MISCELLANEOUS) ×2 IMPLANT

## 2021-09-23 NOTE — Op Note (Signed)
Kendra Nolan PROCEDURE DATE: 09/23/2021   PREOPERATIVE DIAGNOSIS:  Undesired fertility  POSTOPERATIVE DIAGNOSIS:  Undesired fertility  PROCEDURE:  Laparoscopic Bilateral Salpingectomy   SURGEON:  Dr. Radene Gunning  ASSISTANT:  None  ANESTHESIA:  General endotracheal  COMPLICATIONS:  None immediate.  ESTIMATED BLOOD LOSS:  5 ml.  FLUIDS: 500 ml LR.  URINE OUTPUT:  30 ml of clear urine.  INDICATIONS: 31 y.o. G2P1011 with undesired fertility, desires permanent sterilization.  Other forms of contraception were discussed with patient and emphasized alternatives of vasectomy, IUDs and Nexplanon as they have equivalent contraceptive efficacy; she declines all other modalities.  Risks of procedure discussed with patient including permanence of method, risk of regret, bleeding, infection, injury to surrounding organs and need for additional procedures including laparotomy.  Failure risk less than 0.5% with increased risk of ectopic gestation if pregnancy occurs was also discussed with patient.  Written informed consent was obtained.    FINDINGS:  Normal uterus, fallopian tubes, and ovaries.  TECHNIQUE:   After all consents were signed and questions were answered the patient was taken to the operating room where anesthesia was found to be adequate. A catheter was used to drain her bladder. She was prepped and draped in the usual sterile fashion in the dorsal supine position. A timeout was performed.   A skin incision was made with the 11 blade scalpel in the umbilicus. I entered her abdominal cavity with a veress needle using closed technique. opening pressure was 4.  Pneumoperitoneum achieved to a pressure of 15.  The 5 mm optiview port was inserted into the abdomen under direct visualization. Below the point of entry and the pelvis was inspected with no evidence of injury.   The scalpel was then used to make two incsions, one in the LLQ and one suprapubically. A 1mm and 1mm port were  inserted. The ureters were identified bilaterally. The fallopian tubes were cauterized, cut, and detached from their surrounding pelvic structures with the enseal. No bleeding was noted. The specimens were removed through the 49mm port. The pelvis was inspected and was hemostatic. All ports were then withdrawn and the gas drained from abdomen. They were then closed in a subcuticular fashion with 4-0 vicryl and dermabond placed over the incision.  The patient will be discharged to home as per PACU criteria.  Routine postoperative instructions given.  She will follow up in the office in 4 wks for postoperative evaluation.

## 2021-09-23 NOTE — Discharge Instructions (Signed)

## 2021-09-23 NOTE — Anesthesia Preprocedure Evaluation (Signed)
Anesthesia Evaluation  Patient identified by MRN, date of birth, ID band Patient awake    Reviewed: Allergy & Precautions, NPO status , Patient's Chart, lab work & pertinent test results  Airway Mallampati: II  TM Distance: <3 FB Neck ROM: Full    Dental no notable dental hx.    Pulmonary neg pulmonary ROS,    Pulmonary exam normal breath sounds clear to auscultation       Cardiovascular negative cardio ROS Normal cardiovascular exam Rhythm:Regular Rate:Normal     Neuro/Psych negative neurological ROS  negative psych ROS   GI/Hepatic Neg liver ROS,   Endo/Other  Morbid obesity  Renal/GU negative Renal ROS  negative genitourinary   Musculoskeletal negative musculoskeletal ROS (+)   Abdominal   Peds negative pediatric ROS (+)  Hematology negative hematology ROS (+)   Anesthesia Other Findings   Reproductive/Obstetrics negative OB ROS                             Anesthesia Physical Anesthesia Plan  ASA: 2  Anesthesia Plan: General   Post-op Pain Management: Minimal or no pain anticipated   Induction: Intravenous  PONV Risk Score and Plan: 3 and Ondansetron, Dexamethasone, Midazolam and Treatment may vary due to age or medical condition  Airway Management Planned: Oral ETT  Additional Equipment:   Intra-op Plan:   Post-operative Plan: Extubation in OR  Informed Consent: I have reviewed the patients History and Physical, chart, labs and discussed the procedure including the risks, benefits and alternatives for the proposed anesthesia with the patient or authorized representative who has indicated his/her understanding and acceptance.     Dental advisory given  Plan Discussed with: CRNA and Surgeon  Anesthesia Plan Comments:         Anesthesia Quick Evaluation

## 2021-09-23 NOTE — Transfer of Care (Signed)
Immediate Anesthesia Transfer of Care Note  Patient: Kendra Nolan  Procedure(s) Performed: LAPAROSCOPIC BILATERAL SALPINGECTOMY (Bilateral: Abdomen)  Patient Location: PACU  Anesthesia Type:General  Level of Consciousness: awake, alert , oriented and patient cooperative  Airway & Oxygen Therapy: Patient Spontanous Breathing and Patient connected to nasal cannula oxygen  Post-op Assessment: Report given to RN, Post -op Vital signs reviewed and stable and Patient moving all extremities X 4  Post vital signs: Reviewed and stable  Last Vitals:  Vitals Value Taken Time  BP 123/82 09/23/21 1136  Temp    Pulse 80 09/23/21 1138  Resp 15 09/23/21 1138  SpO2 100 % 09/23/21 1138  Vitals shown include unvalidated device data.  Last Pain:  Vitals:   09/23/21 0907  TempSrc: Oral  PainSc: 0-No pain      Patients Stated Pain Goal: 3 (09/23/21 1610)  Complications: No notable events documented.

## 2021-09-23 NOTE — Anesthesia Procedure Notes (Signed)
Procedure Name: Intubation Date/Time: 09/23/2021 10:36 AM Performed by: Jonna Munro, CRNA Pre-anesthesia Checklist: Patient identified, Emergency Drugs available, Suction available, Patient being monitored and Timeout performed Patient Re-evaluated:Patient Re-evaluated prior to induction Oxygen Delivery Method: Circle system utilized Preoxygenation: Pre-oxygenation with 100% oxygen Induction Type: IV induction Ventilation: Mask ventilation without difficulty Laryngoscope Size: Mac and 3 Grade View: Grade I Tube type: Oral Tube size: 7.0 mm Number of attempts: 1 Airway Equipment and Method: Stylet Placement Confirmation: ETT inserted through vocal cords under direct vision, positive ETCO2 and breath sounds checked- equal and bilateral Secured at: 22 cm Tube secured with: Tape Dental Injury: Teeth and Oropharynx as per pre-operative assessment

## 2021-09-23 NOTE — Anesthesia Postprocedure Evaluation (Signed)
Anesthesia Post Note  Patient: Kendra Nolan  Procedure(s) Performed: LAPAROSCOPIC BILATERAL SALPINGECTOMY (Bilateral: Abdomen)     Patient location during evaluation: PACU Anesthesia Type: General Level of consciousness: awake and alert Pain management: pain level controlled Vital Signs Assessment: post-procedure vital signs reviewed and stable Respiratory status: spontaneous breathing, nonlabored ventilation, respiratory function stable and patient connected to nasal cannula oxygen Cardiovascular status: blood pressure returned to baseline and stable Postop Assessment: no apparent nausea or vomiting Anesthetic complications: no   No notable events documented.  Last Vitals:  Vitals:   09/23/21 1200 09/23/21 1215  BP: 105/66 106/74  Pulse: 79 73  Resp: 14 (!) 23  Temp:  36.4 C  SpO2: 100% 99%    Last Pain:  Vitals:   09/23/21 1215  TempSrc:   PainSc: 0-No pain                 [name] S

## 2021-09-23 NOTE — Interval H&P Note (Signed)
History and Physical Interval Note:  09/23/2021 10:08 AM  Kendra Nolan  has presented today for surgery, with the diagnosis of Undesired Fertility.  The various methods of treatment have been discussed with the patient and family. After consideration of risks, benefits and other options for treatment, the patient has consented to  Procedure(s): LAPAROSCOPIC BILATERAL SALPINGECTOMY (Bilateral) as a surgical intervention.  The patient's history has been reviewed, patient examined, no change in status, stable for surgery.  I have reviewed the patient's chart and labs.  Questions were answered to the patient's satisfaction.     Milas Hock

## 2021-09-24 ENCOUNTER — Encounter (HOSPITAL_BASED_OUTPATIENT_CLINIC_OR_DEPARTMENT_OTHER): Payer: Self-pay | Admitting: Obstetrics and Gynecology

## 2021-09-24 LAB — SURGICAL PATHOLOGY

## 2022-02-19 ENCOUNTER — Encounter (HOSPITAL_COMMUNITY): Payer: Self-pay | Admitting: *Deleted

## 2022-04-10 IMAGING — MR MR HIP*L* W/CM
6 series · 40 of 40 positions shown · IV contrast (agent unspecified)
Comparison: Radiographs 02/22/2020

CLINICAL DATA: Chronic bilateral hip pain. Labral tear suspected.
No acute injury or prior relevant surgery.

EXAM:
MRI OF THE LEFT HIP WITH CONTRAST (MR Arthrogram)
TECHNIQUE: Multiplanar, multisequence MR imaging of the hip was performed
immediately following contrast injection into the hip joint under
fluoroscopic guidance. No intravenous contrast was administered.

[Series 4: T1 fat-sat · coronal · 4.0mm · 0.70mm/px · 6 of 25 slices shown (1 of 3)]
[im 1/25]
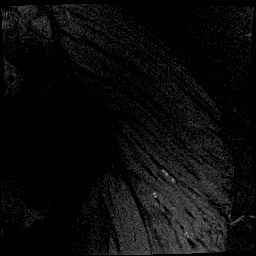
[im 5/25]
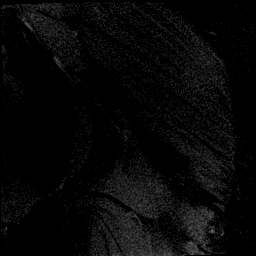
[im 10/25]
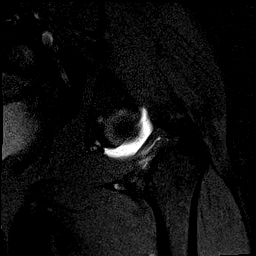
[im 15/25]
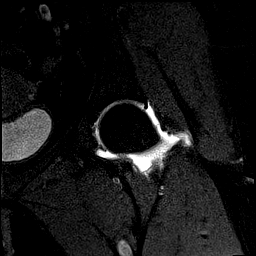
[im 20/25]
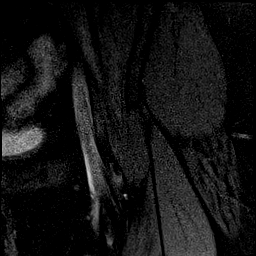
[im 25/25]
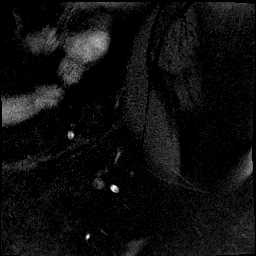

[Series 5: T1 fat-sat · sagittal · 4.0mm · 0.70mm/px · 6 of 28 slices shown (2 of 3)]
[im 1/28]
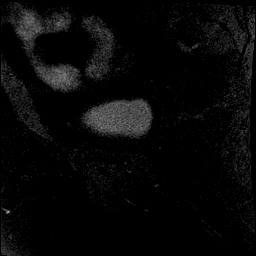
[im 6/28]
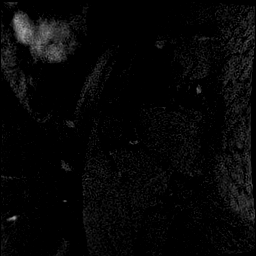
[im 11/28]
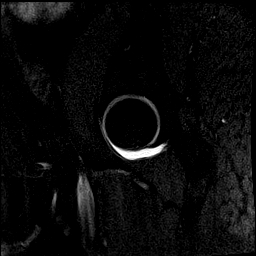
[im 17/28]
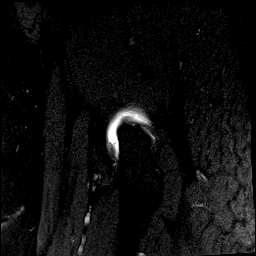
[im 22/28]
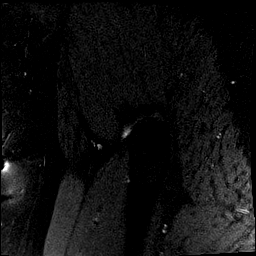
[im 28/28]
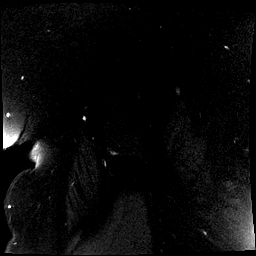

[Series 6: T1 fat-sat · axial · 4.0mm · 0.70mm/px · z∈[-0,+71]mm · 4 of 18 slices shown (3 of 3)]
[im 1/18]
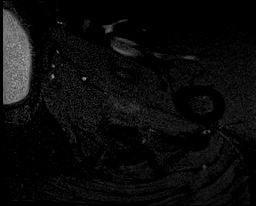
[im 6/18]
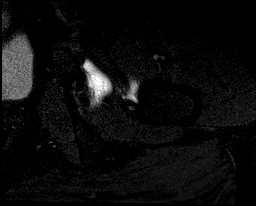
[im 12/18]
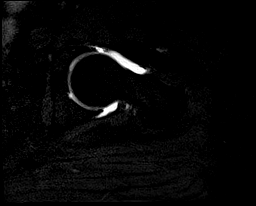
[im 18/18]
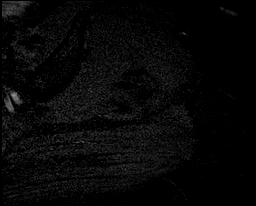

[Series 100: T2 fat-sat · coronal · 4.0mm · 1.33mm/px · 8 of 41 slices shown]
[im 1/41]
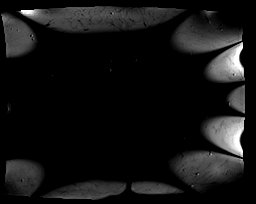
[im 6/41]
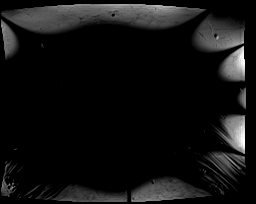
[im 12/41]
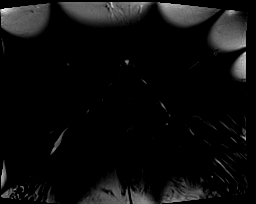
[im 18/41]
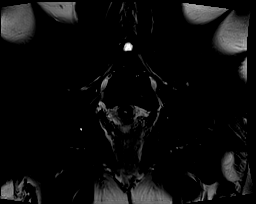
[im 23/41]
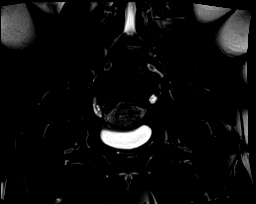
[im 29/41]
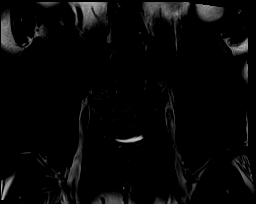
[im 35/41]
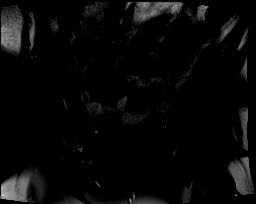
[im 41/41]
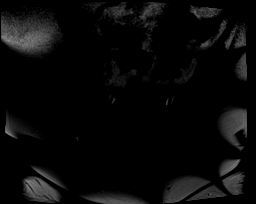

[Series 101: T1 · coronal · 4.0mm · 1.33mm/px · 8 of 41 slices shown]
[im 1/41]
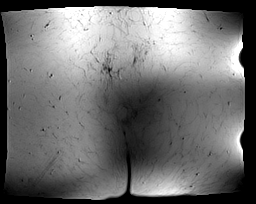
[im 6/41]
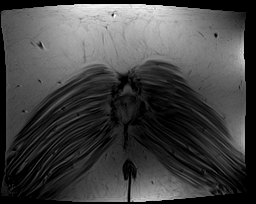
[im 12/41]
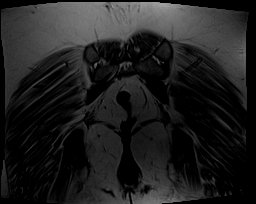
[im 18/41]
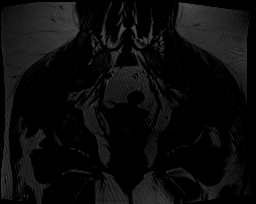
[im 23/41]
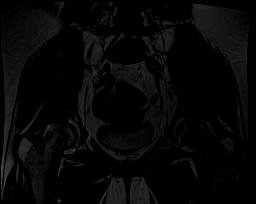
[im 29/41]
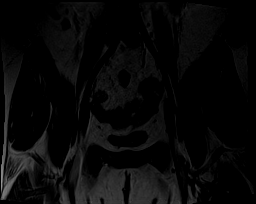
[im 35/41]
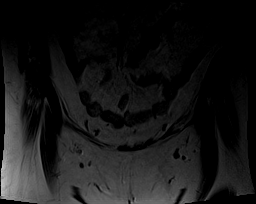
[im 41/41]
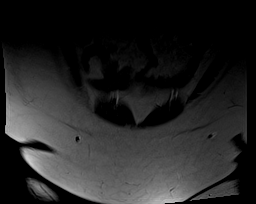

[Series 102: STIR · coronal · 4.0mm · 1.33mm/px · 8 of 41 slices shown]
[im 1/41]
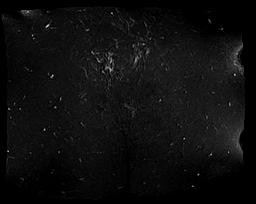
[im 6/41]
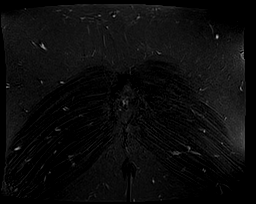
[im 12/41]
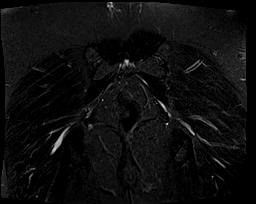
[im 18/41]
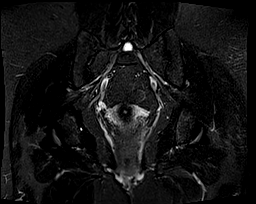
[im 23/41]
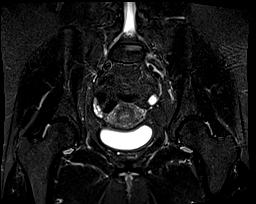
[im 29/41]
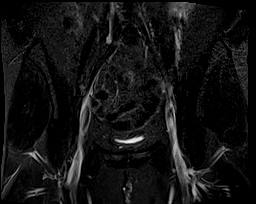
[im 35/41]
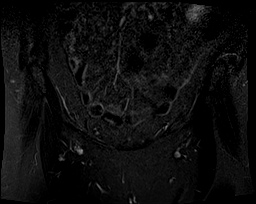
[im 41/41]
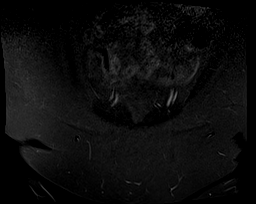

[40 of 40 positions shown; findings below may reference images not displayed]

FINDINGS: Bones: There is no evidence of acute fracture, dislocation or
avascular necrosis. The visualized bony pelvis appears normal.
Minimal sacroiliac degenerative changes asymmetric the right,
without erosive changes.

Articular cartilage and labrum

Articular cartilage: No focal chondral defect or subchondral signal
abnormality identified.

Labrum: No evidence of labral tear or paralabral cyst.

Joint or bursal effusion

Joint effusion: The left hip joint is adequately distended with
contrast. A small amount of gas in the joint is attributed to the
injection. No evidence of loose body.

Bursae: No focal periarticular fluid collection.

Muscles and tendons

Muscles and tendons: The visualized left gluteus, hamstring and
iliopsoas tendons appear normal. The piriformis muscles appear
symmetric.

Other findings

Miscellaneous: The visualized internal pelvic contents appear
unremarkable.
IMPRESSION: 1. No evidence of labral tear.
2. Minimal sacroiliac degenerative changes asymmetric to the right,
without erosive changes.
3. Normal appearance of the left hip and pelvic musculature.

## 2022-04-10 IMAGING — MR MR HIP*R* W/CM
6 series · 40 of 40 positions shown · IV contrast (agent unspecified)
Comparison: Radiographs 02/22/2020

CLINICAL DATA: Chronic bilateral hip pain. Labral tear suspected.
No acute injury or prior relevant surgery.

EXAM:
MRI OF THE RIGHT HIP WITH CONTRAST (MR Arthrogram)
TECHNIQUE: Multiplanar, multisequence MR imaging of the hip was performed
immediately following contrast injection into the hip joint under
fluoroscopic guidance. No intravenous contrast was administered.

[Series 5: T2 fat-sat · coronal · 4.0mm · 1.33mm/px · 8 of 41 slices shown]
[im 1/41]
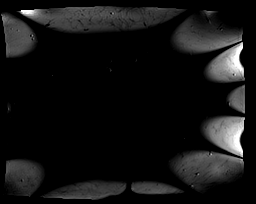
[im 6/41]
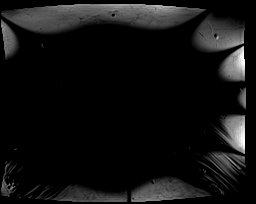
[im 12/41]
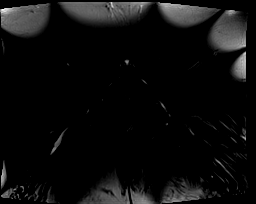
[im 18/41]
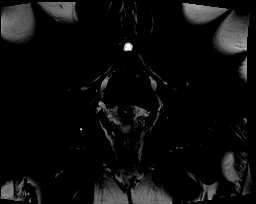
[im 23/41]
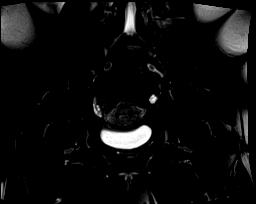
[im 29/41]
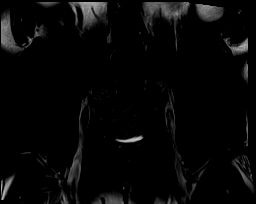
[im 35/41]
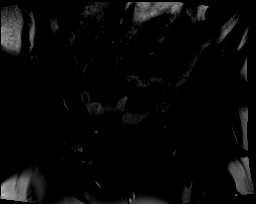
[im 41/41]
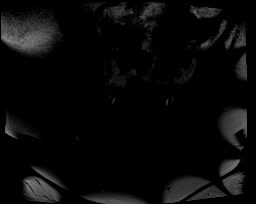

[Series 6: STIR · coronal · 4.0mm · 1.33mm/px · 9 of 41 slices shown]
[im 1/41]
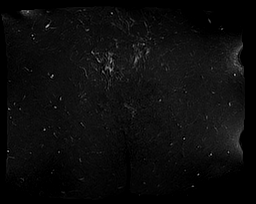
[im 6/41]
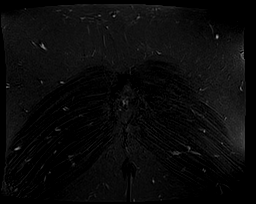
[im 11/41]
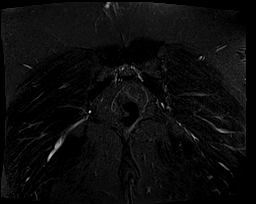
[im 16/41]
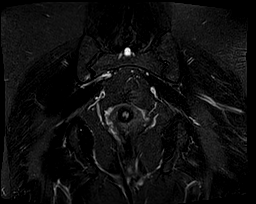
[im 21/41]
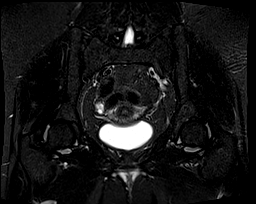
[im 26/41]
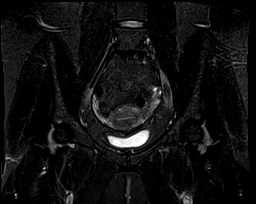
[im 31/41]
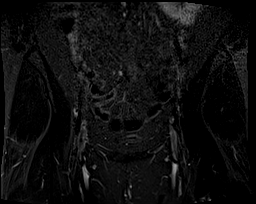
[im 36/41]
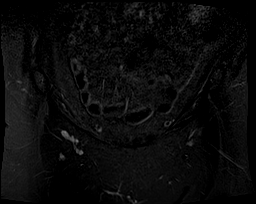
[im 41/41]
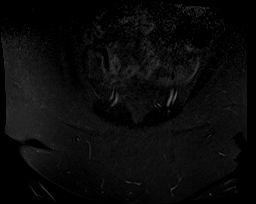

[Series 7: T1 · coronal · 4.0mm · 1.33mm/px · 9 of 41 slices shown]
[im 1/41]
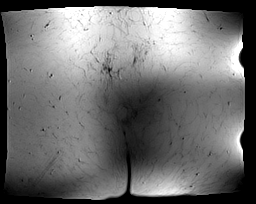
[im 6/41]
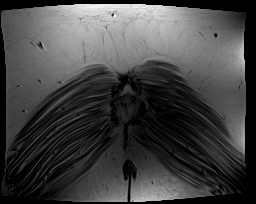
[im 11/41]
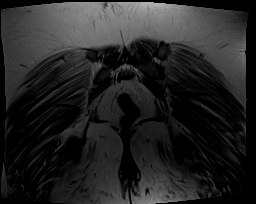
[im 16/41]
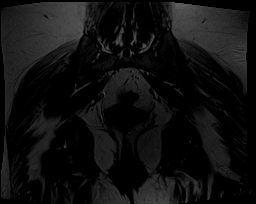
[im 21/41]
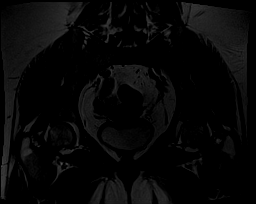
[im 26/41]
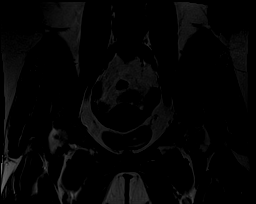
[im 31/41]
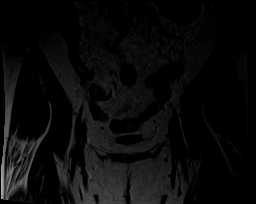
[im 36/41]
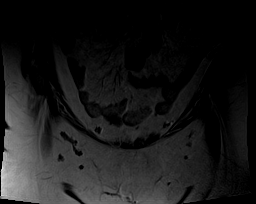
[im 41/41]
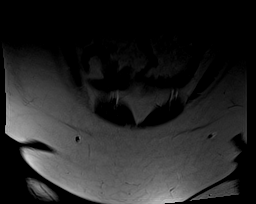

[Series 10: T1 fat-sat · coronal · 4.0mm · 0.70mm/px · 5 of 25 slices shown (1 of 3)]
[im 1/25]
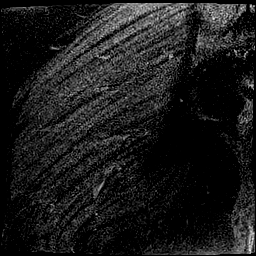
[im 7/25]
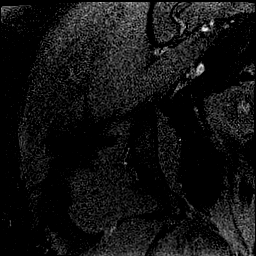
[im 13/25]
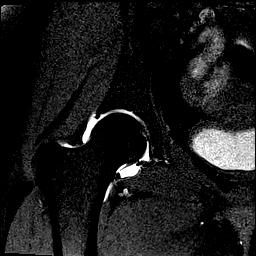
[im 19/25]
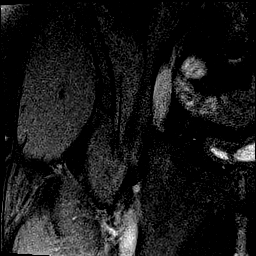
[im 25/25]
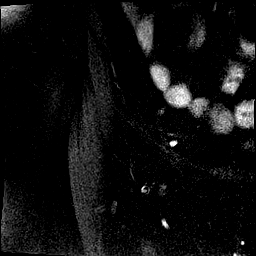

[Series 11: T1 fat-sat · sagittal · 4.0mm · 0.70mm/px · 5 of 26 slices shown (2 of 3)]
[im 1/26]
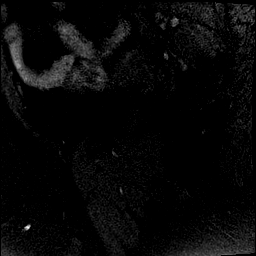
[im 7/26]
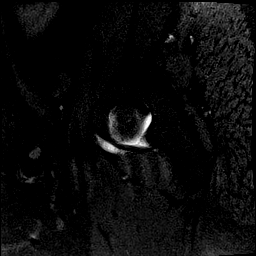
[im 13/26]
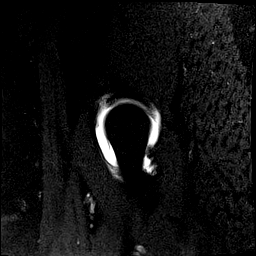
[im 19/26]
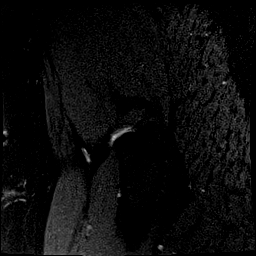
[im 26/26]
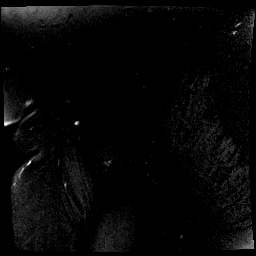

[Series 12: T1 fat-sat · axial · 4.0mm · 0.70mm/px · z∈[-67,+3]mm · 4 of 18 slices shown (3 of 3)]
[im 1/18]
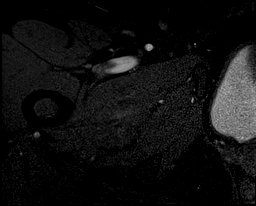
[im 6/18]
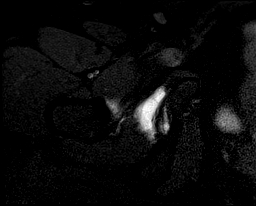
[im 12/18]
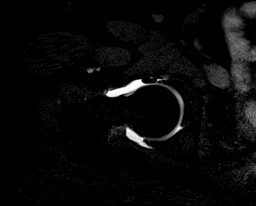
[im 18/18]
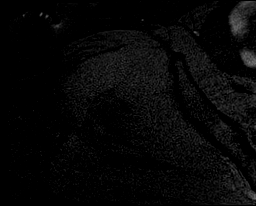

[40 of 40 positions shown; findings below may reference images not displayed]

FINDINGS: Bones: There is no evidence of acute fracture, dislocation or
avascular necrosis. The visualized bony pelvis appears normal.
Minimal sacroiliac degenerative changes asymmetric to the right,
without erosive changes. The symphysis pubis appears normal.

Articular cartilage and labrum

Articular cartilage: No focal chondral defect or subchondral signal
abnormality identified.

Labrum: No evidence of labral tear or paralabral cyst.

Joint or bursal effusion

Joint effusion: The right hip joint is adequately distended with
contrast. A small amount of air is present in the joint attributed
to the injection. No evidence of loose body.

Bursae: No focal periarticular fluid collection.

Muscles and tendons

Muscles and tendons: Minimal gluteus medius tendinosis on the right
without tear. The visualized hamstring and iliopsoas tendons are
intact. The piriformis muscles are symmetric.

Other findings

Miscellaneous: The visualized internal pelvic contents appear
unremarkable.
IMPRESSION: 1. No evidence of labral tear.
2. Minimal sacroiliac degenerative changes asymmetric to the right,
without erosive changes.
3. Minimal gluteus medius tendinosis on the right.

## 2022-05-28 ENCOUNTER — Encounter: Payer: Self-pay | Admitting: Medical-Surgical

## 2022-05-28 ENCOUNTER — Ambulatory Visit: Payer: Self-pay | Admitting: Medical-Surgical

## 2022-05-28 ENCOUNTER — Ambulatory Visit (INDEPENDENT_AMBULATORY_CARE_PROVIDER_SITE_OTHER): Payer: Self-pay

## 2022-05-28 VITALS — BP 117/76 | HR 82 | Resp 20 | Ht 61.0 in | Wt 206.1 lb

## 2022-05-28 DIAGNOSIS — S99911A Unspecified injury of right ankle, initial encounter: Secondary | ICD-10-CM

## 2022-05-28 NOTE — Progress Notes (Signed)
Established Patient Office Visit  Subjective   Patient ID: Kendra Nolan, female   DOB: Apr 14, 1991 Age: 31 y.o. MRN: [id number]   Chief Complaint  Patient presents with   Foot Injury   Ankle Injury    HPI Pleasant 31 year old female presenting today for evaluation of a right ankle injury.  Yesterday she was sitting at work and when she stood up, she realized her foot was asleep.  When she started to take a step, her right ankle rolled toward the inside and she felt immediate pain with a pop that extended all the way up to her knee.  For the first few minutes, she was unable to pick her foot up off the floor however this quickly resolved and she was able to walk on it immediately after the injury.  She did not seek medical attention yesterday.  She went home and has been doing conservative measures with a compressive ankle brace, Tylenol, icing, and elevation.  Her ankle is still painful and tender however she is able to ambulate on it without difficulty.  She wanted to come get it checked to make sure it was not something more serious than an ankle sprain given the severity of the pop that she felt to her knee.   Objective:    Vitals:   05/28/22 1444  BP: 117/76  Pulse: 82  Resp: 20  Height: 5\' 1"  (1.549 m)  Weight: 206 lb 1.6 oz (93.5 kg)  SpO2: 98%  BMI (Calculated): 38.96    Physical Exam Vitals and nursing note reviewed.  Constitutional:      General: She is not in acute distress.    Appearance: Normal appearance. She is not ill-appearing.  HENT:     Head: Normocephalic and atraumatic.  Cardiovascular:     Rate and Rhythm: Normal rate and regular rhythm.     Pulses: Normal pulses.  Pulmonary:     Effort: Pulmonary effort is normal. No respiratory distress.  Musculoskeletal:     Right ankle: Swelling present. Tenderness present over the lateral malleolus. Normal pulse.     Right Achilles Tendon: Normal.     Left ankle: Normal.     Left Achilles Tendon: Normal.   Skin:    General: Skin is warm and dry.  Neurological:     Mental Status: She is alert and oriented to person, place, and time.  Psychiatric:        Mood and Affect: Mood normal.        Behavior: Behavior normal.        Thought Content: Thought content normal.        Judgment: Judgment normal.   No results found for this or any previous visit (from the past 24 hour(s)).     The ASCVD Risk score (Arnett DK, et al., 2019) failed to calculate for the following reasons:   The 2019 ASCVD risk score is only valid for ages 71 to 37   Assessment & Plan:   1. Injury of right ankle, initial encounter Right ankle x-rays today.  Continue conservative measures with RICE.  Okay to use Tylenol 1000 mg every 6-8 hours.  Consider adding ibuprofen 600 mg every 6 hours.  Ankle exercises printed for home completion however advised to hold off on starting these until we have her x-rays completed and report available. - DG Ankle Complete Right; Future  Return if symptoms worsen or fail to improve. ___________________________________________ Clearnce Sorrel, DNP, APRN, FNP-BC [facility]

## 2022-07-08 DIAGNOSIS — J069 Acute upper respiratory infection, unspecified: Secondary | ICD-10-CM | POA: Diagnosis not present

## 2022-07-08 DIAGNOSIS — B9689 Other specified bacterial agents as the cause of diseases classified elsewhere: Secondary | ICD-10-CM | POA: Diagnosis not present

## 2022-08-02 DIAGNOSIS — Z20822 Contact with and (suspected) exposure to covid-19: Secondary | ICD-10-CM | POA: Diagnosis not present

## 2022-08-02 DIAGNOSIS — J069 Acute upper respiratory infection, unspecified: Secondary | ICD-10-CM | POA: Diagnosis not present

## 2022-08-02 DIAGNOSIS — H6691 Otitis media, unspecified, right ear: Secondary | ICD-10-CM | POA: Diagnosis not present

## 2023-02-25 ENCOUNTER — Encounter (HOSPITAL_COMMUNITY): Payer: Self-pay | Admitting: *Deleted

## 2023-06-14 ENCOUNTER — Ambulatory Visit: Payer: Self-pay | Admitting: Family Medicine

## 2023-06-15 ENCOUNTER — Ambulatory Visit: Payer: Managed Care, Other (non HMO) | Admitting: Obstetrics and Gynecology

## 2023-06-15 ENCOUNTER — Encounter: Payer: Self-pay | Admitting: Obstetrics and Gynecology

## 2023-06-15 VITALS — BP 111/76 | HR 75 | Resp 16 | Ht 61.0 in | Wt 219.0 lb

## 2023-06-15 DIAGNOSIS — N6310 Unspecified lump in the right breast, unspecified quadrant: Secondary | ICD-10-CM

## 2023-06-15 NOTE — Progress Notes (Signed)
GYNECOLOGY ANNUAL PREVENTATIVE CARE ENCOUNTER NOTE  History:     Kendra Nolan is a 32 y.o. G28P1011 female here for a routine annual gynecologic exam.  Current complaints: right breast lump found 3 days ago while doing a routine, home breast exam. Maternal Aunt with non genetic breast CA.Marland Kitchen   Denies abnormal vaginal bleeding, discharge, pelvic pain, problems with intercourse or other gynecologic concerns.    Gynecologic History Patient's last menstrual period was 05/25/2023.   Obstetric History OB History  Gravida Para Term Preterm AB Living  2 1 1   1 1   SAB IAB Ectopic Multiple Live Births  1     0 1    # Outcome Date GA Lbr Len/2nd Weight Sex Type Anes PTL Lv  2 Term 07/27/17 [redacted]w[redacted]d 04:21 / 01:15 7 lb 6.9 oz (3.37 kg) F Vag-Spont EPI  LIV  1 SAB             Past Medical History:  Diagnosis Date   Anxiety    COVID 07/31/2020   muscle aches, fever, night sweats, congestion - no treatment   Depression    GERD (gastroesophageal reflux disease)    Pt states this no longer bothers her as of 09/17/21.   Hip pain, bilateral 2021   Pt states hip pain is no longer an issue as of 09/17/21.   Migraine    Pt states she only gets them randomly now as of 09/17/21.   Wears contact lenses    Wears glasses     Past Surgical History:  Procedure Laterality Date   LAPAROSCOPIC BILATERAL SALPINGECTOMY Bilateral 09/23/2021   Procedure: LAPAROSCOPIC BILATERAL SALPINGECTOMY;  Surgeon: Milas Hock, MD;  Location: [facility];  Service: Gynecology;  Laterality: Bilateral;   LAPAROSCOPIC GASTRIC SLEEVE RESECTION N/A 07/18/2018   Procedure: LAPAROSCOPIC GASTRIC SLEEVE RESECTION, UPPER ENDO, ERAS Pathway;  Surgeon: Berna Bue, MD;  Location: [facility];  Service: General;  Laterality: N/A;   TONSILLECTOMY AND ADENOIDECTOMY     as a child    Current Outpatient Medications on File Prior to Visit  Medication Sig Dispense Refill   brompheniramine-pseudoephedrine-DM 30-2-10  MG/5ML syrup Take 5 mLs by mouth 3 (three) times daily as needed.     nitrofurantoin, macrocrystal-monohydrate, (MACROBID) 100 MG capsule Take by mouth.     No current facility-administered medications on file prior to visit.    Allergies  Allergen Reactions   Codeine Hives    Codeine cough syrup   Clarithromycin Hives   Penicillins Hives    Has patient had a PCN reaction causing immediate rash, facial/tongue/throat swelling, SOB or lightheadedness with hypotension: Yes Has patient had a PCN reaction causing severe rash involving mucus membranes or skin necrosis: Yes Has patient had a PCN reaction that required hospitalization: No Has patient had a PCN reaction occurring within the last 10 years: No If all of the above answers are "NO", then may proceed with Cephalosporin use.     Social History:  reports that she has never smoked. She has never used smokeless tobacco. She reports that she does not drink alcohol and does not use drugs.  Family History  Problem Relation Age of Onset   Cancer Maternal Aunt    Hypertension Mother    Hyperlipidemia Father    Hyperlipidemia Maternal Grandmother    Hyperlipidemia Maternal Grandfather    Hypertension Maternal Grandfather    Hyperlipidemia Paternal Grandmother    Hyperlipidemia Paternal Grandfather    Asthma Other  COPD Other     The following portions of the patient's history were reviewed and updated as appropriate: allergies, current medications, past family history, past medical history, past social history, past surgical history and problem list.  Review of Systems Pertinent items noted in HPI and remainder of comprehensive ROS otherwise negative.  Physical Exam:  BP 111/76   Pulse 75   Resp 16   Ht 5\' 1"  (1.549 m)   Wt 219 lb (99.3 kg)   LMP 05/25/2023   BMI 41.38 kg/m  CONSTITUTIONAL: Well-developed, well-nourished female in no acute distress.  HENT:  Normocephalic, atraumatic, External right and left ear normal.   EYES: Conjunctivae and EOM are normal.  SKIN: Skin is warm and dry.  NEUROLOGIC: Alert and oriented to person, place, and time. RESPIRATORY: Clear to auscultation bilaterally. BREASTS: Symmetric in size. Right breast with 3 cm palpable nodule at 1 o'clock, 2 cm from the areola. Non tender, mobile.  Chaperon present.     Assessment and Plan:    1. Mass of right breast, unspecified quadrant  - MM Digital Diagnostic Unilat R; Future - US BREAST COMPLETE UNI RIGHT INC AXILLA; Future  Ariela Mochizuki, Harolyn Rutherford, NP [facility] for [employer], [facility]

## 2023-06-21 ENCOUNTER — Encounter: Payer: Self-pay | Admitting: *Deleted

## 2023-06-28 ENCOUNTER — Other Ambulatory Visit: Payer: Managed Care, Other (non HMO)

## 2023-08-30 ENCOUNTER — Ambulatory Visit (INDEPENDENT_AMBULATORY_CARE_PROVIDER_SITE_OTHER): Payer: Managed Care, Other (non HMO) | Admitting: Sports Medicine

## 2023-08-30 VITALS — BP 124/82 | HR 79 | Temp 97.8°F | Ht 61.0 in | Wt 213.0 lb

## 2023-08-30 DIAGNOSIS — N3000 Acute cystitis without hematuria: Secondary | ICD-10-CM | POA: Diagnosis not present

## 2023-08-30 LAB — POCT URINALYSIS DIP (CLINITEK)
Bilirubin, UA: NEGATIVE
Glucose, UA: NEGATIVE mg/dL
Ketones, POC UA: NEGATIVE mg/dL
Nitrite, UA: POSITIVE — AB
POC PROTEIN,UA: NEGATIVE
Spec Grav, UA: <=1.005 — AB (ref 1.010–1.025)
Urobilinogen, UA: 0.2 U/dL
pH, UA: 5.5 (ref 5.0–8.0)

## 2023-08-30 MED ORDER — FLUCONAZOLE 150 MG PO TABS: 150.0000 mg | ORAL_TABLET | Freq: Once | ORAL | 3 refills | Status: AC

## 2023-08-30 MED ORDER — NITROFURANTOIN MONOHYD MACRO 100 MG PO CAPS: 100.0000 mg | ORAL_CAPSULE | Freq: Two times a day (BID) | ORAL | 0 refills | Status: DC

## 2023-08-30 NOTE — Assessment & Plan Note (Signed)
This is a very pleasant 33 year old female, she has had several days of increasing pain and burning, urgency and frequency when peeing, initially some blood but this has resolved, today she has large leukocytes in her urinalysis. No flank pain, no fevers or chills, suspect uncomplicated UTI. We will add Macrobid, Diflucan, we will culture the urine. She is going out of town to [employer], return to see me as needed.

## 2023-08-30 NOTE — Progress Notes (Signed)
    Procedures performed today:    None.  Independent interpretation of notes and tests performed by another provider:   None.  Brief History, Exam, Impression, and Recommendations:    Acute cystitis This is a very pleasant 33 year old female, she has had several days of increasing pain and burning, urgency and frequency when peeing, initially some blood but this has resolved, today she has large leukocytes in her urinalysis. No flank pain, no fevers or chills, suspect uncomplicated UTI. We will add Macrobid, Diflucan, we will culture the urine. She is going out of town to [employer], return to see me as needed.    ____________________________________________ Ihor Austin. Benjamin Stain, M.D., ABFM., CAQSM., AME. Primary Care and Sports Medicine [facility]  Adjunct Professor of Family Medicine  [employer]  Restaurant manager, [employer]

## 2023-09-01 LAB — URINE CULTURE

## 2023-09-07 ENCOUNTER — Ambulatory Visit
Admission: EM | Admit: 2023-09-07 | Discharge: 2023-09-07 | Disposition: A | Discharge status: Home / Self Care | Payer: Managed Care, Other (non HMO) | Attending: Family Medicine | Admitting: Family Medicine

## 2023-09-07 ENCOUNTER — Other Ambulatory Visit: Payer: Self-pay

## 2023-09-07 DIAGNOSIS — R220 Localized swelling, mass and lump, head: Secondary | ICD-10-CM | POA: Diagnosis not present

## 2023-09-07 DIAGNOSIS — R21 Rash and other nonspecific skin eruption: Secondary | ICD-10-CM

## 2023-09-07 MED ORDER — METHYLPREDNISOLONE SODIUM SUCC 125 MG IJ SOLR
125.0000 mg | Freq: Once | INTRAMUSCULAR | Status: AC
  Administered 2023-09-07: 125 mg via INTRAMUSCULAR

## 2023-09-07 MED ORDER — FEXOFENADINE HCL 180 MG PO TABS: 180.0000 mg | ORAL_TABLET | Freq: Every day | ORAL | 0 refills | Status: DC

## 2023-09-07 MED ORDER — PREDNISONE 10 MG (21) PO TBPK
ORAL_TABLET | Freq: Every day | ORAL | 0 refills | Status: DC
Start: 2023-09-07 — End: 2024-03-29

## 2023-09-07 NOTE — ED Triage Notes (Signed)
Yesterday morning woke up with swelling to left eye, which has spread. Reports this morning she woke up with hives to body. No new exposures to potential allergens. Has had benadryl (last night).

## 2023-09-07 NOTE — ED Provider Notes (Signed)
 [facility]    CSN: [id number] Arrival date & time: 09/07/23  0821      History   Chief Complaint No chief complaint on file.   HPI Kendra Nolan is a 33 y.o. female.   HPI  Past Medical History:  Diagnosis Date   Anxiety    COVID 07/31/2020   muscle aches, fever, night sweats, congestion - no treatment   Depression    GERD (gastroesophageal reflux disease)    Pt states this no longer bothers her as of 09/17/21.   Hip pain, bilateral 2021   Pt states hip pain is no longer an issue as of 09/17/21.   Migraine    Pt states she only gets them randomly now as of 09/17/21.   Wears contact lenses    Wears glasses     Patient Active Problem List   Diagnosis Date Noted   Acute cystitis 08/30/2023   Unwanted fertility    Anxiety 08/14/2021   Bilateral hip pain 02/22/2020   Screening for hyperlipidemia 12/29/2018   Infertility associated with anovulation 12/10/2015   Irregular menstrual cycle 07/09/2015   GERD (gastroesophageal reflux disease) 03/07/2015   Major depression 02/23/2013   Preventive measure 11/16/2012   Migraines 11/16/2012   Obesity 11/16/2012   Allergic rhinitis 04/23/2009   Acute upper respiratory infection 07/09/2008    Past Surgical History:  Procedure Laterality Date   LAPAROSCOPIC BILATERAL SALPINGECTOMY Bilateral 09/23/2021   Procedure: LAPAROSCOPIC BILATERAL SALPINGECTOMY;  Surgeon: Cleatus Moccasin, MD;  Location: [facility];  Service: Gynecology;  Laterality: Bilateral;   LAPAROSCOPIC GASTRIC SLEEVE RESECTION N/A 07/18/2018   Procedure: LAPAROSCOPIC GASTRIC SLEEVE RESECTION, UPPER ENDO, ERAS Pathway;  Surgeon: Signe Mitzie LABOR, MD;  Location: [facility];  Service: General;  Laterality: N/A;   TONSILLECTOMY AND ADENOIDECTOMY     as a child    OB History     Gravida  2   Para  1   Term  1   Preterm      AB  1   Living  1      SAB  1   IAB      Ectopic      Multiple  0   Live Births  1             Home Medications    Prior to Admission medications   Medication Sig Start Date End Date Taking? Authorizing Provider  fexofenadine  (ALLEGRA  ALLERGY) 180 MG tablet Take 1 tablet (180 mg total) by mouth daily for 15 days. 09/07/23 09/22/23 Yes Teddy Sharper, FNP  predniSONE  (STERAPRED UNI-PAK 21 TAB) 10 MG (21) TBPK tablet Take by mouth daily. Take 6 tabs by mouth daily  for 2 days, then 5 tabs for 2 days, then 4 tabs for 2 days, then 3 tabs for 2 days, 2 tabs for 2 days, then 1 tab by mouth daily for 2 days 09/07/23  Yes Teddy Sharper, FNP    Family History Family History  Problem Relation Age of Onset   Cancer Maternal Aunt    Hypertension Mother    Hyperlipidemia Father    Hyperlipidemia Maternal Grandmother    Hyperlipidemia Maternal Grandfather    Hypertension Maternal Grandfather    Hyperlipidemia Paternal Grandmother    Hyperlipidemia Paternal Grandfather    Asthma Other    COPD Other     Social History Social History   Tobacco Use   Smoking status: Never   Smokeless tobacco: Never  Vaping Use  Vaping status: Never Used  Substance Use Topics   Alcohol use: No   Drug use: No     Allergies   Codeine, Clarithromycin, and Penicillins   Review of Systems Review of Systems  Skin:  Positive for rash.  All other systems reviewed and are negative.    Physical Exam Triage Vital Signs ED Triage Vitals  Encounter Vitals Group     BP      Systolic BP Percentile      Diastolic BP Percentile      Pulse      Resp      Temp      Temp src      SpO2      Weight      Height      Head Circumference      Peak Flow      Pain Score      Pain Loc      Pain Education      Exclude from Growth Chart    No data found.  Updated Vital Signs BP 110/74   Pulse 73   Temp 98.6 F (37 C)   Resp 16   LMP 08/24/2023 (Approximate)   SpO2 96%   Physical Exam Vitals and nursing note reviewed.  Constitutional:      Appearance: Normal appearance. She is obese.   HENT:     Head: Normocephalic and atraumatic.     Right Ear: Tympanic membrane, ear canal and external ear normal.     Left Ear: Tympanic membrane, ear canal and external ear normal.     Mouth/Throat:     Mouth: Mucous membranes are moist.     Pharynx: Oropharynx is clear.  Eyes:     Extraocular Movements: Extraocular movements intact.     Conjunctiva/sclera: Conjunctivae normal.     Pupils: Pupils are equal, round, and reactive to light.  Cardiovascular:     Rate and Rhythm: Normal rate and regular rhythm.     Pulses: Normal pulses.     Heart sounds: Normal heart sounds.  Pulmonary:     Effort: Pulmonary effort is normal.     Breath sounds: Normal breath sounds. No rhonchi or rales.  Musculoskeletal:        General: Normal range of motion.     Cervical back: Normal range of motion and neck supple.  Skin:    General: Skin is warm and dry.     Comments: Face: Notable soft tissue swelling around the orbits and upper eyelids-please see image below; posterior neck (at hairline): Pruritic erythematous, painful rash per patient  Neurological:     General: No focal deficit present.     Mental Status: She is oriented to person, place, and time. Mental status is at baseline.  Psychiatric:        Mood and Affect: Mood normal.        Behavior: Behavior normal.         UC Treatments / Results  Labs (all labs ordered are listed, but only abnormal results are displayed) Labs Reviewed - No data to display  EKG   Radiology No results found.  Procedures Procedures (including critical care time)  Medications Ordered in UC Medications  methylPREDNISolone  sodium succinate (SOLU-MEDROL ) 125 mg/2 mL injection 125 mg (125 mg Intramuscular Given 09/07/23 0907)    Initial Impression / Assessment and Plan / UC Course  I have reviewed the triage vital signs and the nursing notes.  Pertinent labs & imaging results that  were available during my care of the patient were reviewed by me  and considered in my medical decision making (see chart for details).     MDM: 1.  Facial swelling-IM Solu-Medrol  125 mg given once in clinic and prior to discharge; 2.  Rash and nonspecific skin eruption-Rx Sterapred Unipak (tapering from 60 mg to 10 mg over 10 days). Advised patient to take medications as directed with food to completion.  Advised patient to take Allegra  daily for the next 5 days, then as needed for hive-like rash.  Advised patient to start Sterapred Unipak in 3 hours or 12:30 PM today.  Encouraged to increase daily water intake to 64 ounces per day while taking these medications.  Advised if symptoms worsen and/or unresolved please follow-up with your PCP or here for further evaluation.  Patient discharged home, hemodynamically stable. Final Clinical Impressions(s) / UC Diagnoses   Final diagnoses:  Facial swelling  Rash and nonspecific skin eruption     Discharge Instructions      Advised patient to take medications as directed with food to completion.  Advised patient to take Allegra  daily for the next 5 days, then as needed for hive-like rash.  Advised patient to start Sterapred Unipak in 3 hours or 12:30 PM today.  Encouraged to increase daily water intake to 64 ounces per day while taking these medications.  Advised if symptoms worsen and/or unresolved please follow-up with your PCP or here for further evaluation.     ED Prescriptions     Medication Sig Dispense Auth. Provider   predniSONE  (STERAPRED UNI-PAK 21 TAB) 10 MG (21) TBPK tablet Take by mouth daily. Take 6 tabs by mouth daily  for 2 days, then 5 tabs for 2 days, then 4 tabs for 2 days, then 3 tabs for 2 days, 2 tabs for 2 days, then 1 tab by mouth daily for 2 days 42 tablet Teddy Sharper, FNP   fexofenadine  (ALLEGRA  ALLERGY) 180 MG tablet Take 1 tablet (180 mg total) by mouth daily for 15 days. 15 tablet Siriyah Ambrosius, FNP      PDMP not reviewed this encounter.   Teddy Sharper, FNP 09/07/23  [id number]

## 2023-09-07 NOTE — Discharge Instructions (Addendum)
 Advised patient to take medications as directed with food to completion.  Advised patient to take Allegra  daily for the next 5 days, then as needed for hive-like rash.  Advised patient to start Sterapred Unipak in 3 hours or 12:30 PM today.  Encouraged to increase daily water intake to 64 ounces per day while taking these medications.  Advised if symptoms worsen and/or unresolved please follow-up with your PCP or here for further evaluation.

## 2024-02-24 ENCOUNTER — Encounter (HOSPITAL_COMMUNITY): Payer: Self-pay | Admitting: *Deleted

## 2024-03-15 ENCOUNTER — Other Ambulatory Visit (HOSPITAL_COMMUNITY)
Admission: RE | Admit: 2024-03-15 | Discharge: 2024-03-15 | Disposition: A | Discharge status: Home / Self Care | Source: Ambulatory Visit | Attending: Obstetrics and Gynecology | Admitting: Obstetrics and Gynecology

## 2024-03-15 ENCOUNTER — Ambulatory Visit: Admitting: Obstetrics and Gynecology

## 2024-03-15 ENCOUNTER — Encounter: Payer: Self-pay | Admitting: Obstetrics and Gynecology

## 2024-03-15 VITALS — BP 118/86 | HR 74 | Ht 61.0 in | Wt 217.0 lb

## 2024-03-15 DIAGNOSIS — N920 Excessive and frequent menstruation with regular cycle: Secondary | ICD-10-CM | POA: Diagnosis not present

## 2024-03-15 DIAGNOSIS — Z113 Encounter for screening for infections with a predominantly sexual mode of transmission: Secondary | ICD-10-CM | POA: Diagnosis not present

## 2024-03-15 DIAGNOSIS — Z1331 Encounter for screening for depression: Secondary | ICD-10-CM | POA: Diagnosis not present

## 2024-03-15 DIAGNOSIS — Z01419 Encounter for gynecological examination (general) (routine) without abnormal findings: Secondary | ICD-10-CM

## 2024-03-15 MED ORDER — TRANEXAMIC ACID 650 MG PO TABS: 1300.0000 mg | ORAL_TABLET | Freq: Three times a day (TID) | ORAL | 6 refills | Status: AC

## 2024-03-15 NOTE — Progress Notes (Signed)
 ANNUAL EXAM Patient name: Kendra Nolan MRN [id number]  Date of birth: 11-10-1990 Chief Complaint:   Gynecologic Exam (Pt reported very heavy periods x 6 months, having to wear a super maxi and another pad, regular periods but has noticed worsening since having salpingectomy in 2023.)  History of Present Illness:   Kendra Nolan is a 33 y.o. G32P1011 female being seen today for a routine annual exam.   Current concerns: Heavy periods for the past 6 months. Soaking through pads and has to wear two pads. First day is light, then very heavy for 3-4 days. Then tapers. Total length of time is 6-7 days. After one particularly heavy periods, she had dizziness and felt poor. Started taking some iron. She would generally like to avoid hormonal treatment.   Current birth control: BTS  Patient's last menstrual period was 03/08/2024 (exact date).   Last MXR: Had breast imaging at [facility] and was normal for right breast lump.   Last Pap/Pap History:  04/2021: Pap normal/HPV negative     Review of Systems:   Pertinent items are noted in HPI Denies any headaches, blurred vision, fatigue, shortness of breath, chest pain, abdominal pain, abnormal vaginal discharge/itching/odor/irritation, bowel movements, urination, or intercourse unless otherwise stated above.  Pertinent History Reviewed:  Reviewed past medical,surgical, social and family history.  Reviewed problem list, medications and allergies. Physical Assessment:   Vitals:   03/15/24 1054  BP: 118/86  Pulse: 74  Weight: 217 lb (98.4 kg)  Height: 5' 1 (1.549 m)  Body mass index is 41 kg/m.   Physical Examination:  General appearance - well appearing, and in no distress Mental status - alert, oriented to person, place, and time Psych:  She has a normal mood and affect Skin - warm and dry, normal color, no suspicious lesions noted Chest - effort normal Heart - normal rate  Breasts - breasts appear normal, no suspicious masses, no  skin or nipple changes or axillary nodes Abdomen - soft, nontender, nondistended, no masses or organomegaly Pelvic -  Performed and: VULVA: normal appearing vulva with no masses, tenderness or lesions VAGINA: Not examined CERVIX: normal appearing cervix without discharge or lesions, no CMT. UTERUS: uterus is felt to be normal size, shape, consistency and nontender ADNEXA: No adnexal masses or tenderness noted Extremities:  No swelling or varicosities noted  Chaperone present for exam  No results found for this or any previous visit (from the past 24 hours).  Assessment & Plan:  [name] was seen today for gynecologic exam.  Diagnoses and all orders for this visit:  Encounter for annual routine gynecological examination - Cervical cancer screening: Discussed guidelines. Pap with HPV normal 04/2021.  - GC/CT: accepts - Birth Control: BTS - Breast Health: Encouraged self breast awareness/SBE. Teaching provided.  - F/U 12 months and prn  Menorrhagia with regular cycle Check US  and try TXA with next cycle.  Check lab work for anemia and thyroid dysfunction.  Follow up with me in one month.  -     tranexamic acid  (LYSTEDA ) 650 MG TABS tablet; Take 2 tablets (1,300 mg total) by mouth 3 (three) times daily. Take during menses for a maximum of five days -     US  PELVIC COMPLETE WITH TRANSVAGINAL; Future -     TSH Rfx on Abnormal to Free T4  Routine screening for STI (sexually transmitted infection) -     Cervicovaginal ancillary only( [place]) -     Hepatitis C antibody -  HIV Antibody (routine testing w rflx) -     RPR -     CBC   Orders Placed This Encounter  Procedures   US  PELVIC COMPLETE WITH TRANSVAGINAL   Hepatitis C antibody   HIV Antibody (routine testing w rflx)   RPR   CBC   TSH Rfx on Abnormal to Free T4    Meds:  Meds ordered this encounter  Medications   tranexamic acid  (LYSTEDA ) 650 MG TABS tablet    Sig: Take 2 tablets (1,300 mg total) by mouth 3  (three) times daily. Take during menses for a maximum of five days    Dispense:  30 tablet    Refill:  6    Follow-up: Return in about 1 year (around 03/15/2025) for annual.  Vina Solian, MD 03/15/2024 11:18 AM

## 2024-03-16 ENCOUNTER — Ambulatory Visit: Payer: Self-pay | Admitting: Obstetrics and Gynecology

## 2024-03-16 LAB — RPR+HBSAG+HCVAB+...
HIV Screen 4th Generation wRfx: NONREACTIVE
Hep C Virus Ab: NONREACTIVE
Hepatitis B Surface Ag: NEGATIVE
RPR Ser Ql: NONREACTIVE

## 2024-03-16 LAB — CERVICOVAGINAL ANCILLARY ONLY
Chlamydia: NEGATIVE
Comment: NEGATIVE
Comment: NEGATIVE
Comment: NORMAL
Neisseria Gonorrhea: NEGATIVE
Trichomonas: NEGATIVE

## 2024-03-16 LAB — CBC
Hematocrit: 44.4 % (ref 34.0–46.6)
Hemoglobin: 14 g/dL (ref 11.1–15.9)
MCH: 30.5 pg (ref 26.6–33.0)
MCHC: 31.5 g/dL (ref 31.5–35.7)
MCV: 97 fL (ref 79–97)
Platelets: 351 x10E3/uL (ref 150–450)
RBC: 4.59 x10E6/uL (ref 3.77–5.28)
RDW: 12.5 % (ref 11.7–15.4)
WBC: 6.9 x10E3/uL (ref 3.4–10.8)

## 2024-03-16 LAB — TSH RFX ON ABNORMAL TO FREE T4: TSH: 0.974 u[IU]/mL (ref 0.450–4.500)

## 2024-03-21 ENCOUNTER — Ambulatory Visit: Admitting: Sports Medicine

## 2024-03-27 ENCOUNTER — Ambulatory Visit: Admitting: Sports Medicine

## 2024-03-29 ENCOUNTER — Ambulatory Visit (INDEPENDENT_AMBULATORY_CARE_PROVIDER_SITE_OTHER): Admitting: Urgent Care

## 2024-03-29 ENCOUNTER — Encounter: Payer: Self-pay | Admitting: Urgent Care

## 2024-03-29 VITALS — BP 100/71 | HR 78 | Resp 17 | Ht 61.0 in | Wt 220.8 lb

## 2024-03-29 DIAGNOSIS — G4709 Other insomnia: Secondary | ICD-10-CM

## 2024-03-29 MED ORDER — TRAZODONE HCL 50 MG PO TABS
25.0000 mg | ORAL_TABLET | Freq: Every evening | ORAL | 3 refills | Status: AC | PRN
Start: 2024-03-29 — End: ?

## 2024-03-29 NOTE — Progress Notes (Signed)
 Established Patient Office Visit  Subjective:  Patient ID: Kendra Nolan, female    DOB: [birth year]  Age: 33 y.o. MRN: [id number]  Chief Complaint  Patient presents with   Anxiety   Insomnia    Mom has a hard time falling asleep and staying the daughter wake up every hr is affecting both    HPI  Discussed the use of AI scribe software for clinical note transcription with the patient, who gave verbal consent to proceed.  History of Present Illness   Kendra Nolan is a 33 year old female who presents with anxiety and sleep disturbances.  She has a history of anxiety and was previously on medication, which she discontinued three years ago after separating from her daughter's father, as she felt stable at that time. Currently, she is experiencing significant stress due to her daughter's behavior and anxiety issues.  She has been on a waiting list for a year for her daughter to see a specialist, and she feels at her 'breaking point' due to the ongoing issues.  Recently, her daughter watched a video that scared her, leading to sleep disturbances. Her daughter wakes up every hour, which has resulted in her also experiencing sleep deprivation. She describes herself as 'running on no sleep' and notes that she already had difficulty falling asleep, although once asleep, she would sleep well. The frequent awakenings have exacerbated her anxiety and stress levels.  She has not taken any medication for sleep in the past and is concerned about taking sleep medication due to her responsibilities as a single mother. She is worried about being able to respond to her daughter's needs during the night if she is on medication.  Her current medications include Lysteda  (tranexamic acid ), which she has not yet started, prescribed for her menstrual period issues. She is not currently taking steroids or Allegra .       Patient Active Problem List   Diagnosis Date Noted   Anxiety 08/14/2021   Bilateral  hip pain 02/22/2020   GERD (gastroesophageal reflux disease) 03/07/2015   Major depression 02/23/2013   Migraines 11/16/2012   Obesity 11/16/2012   Allergic rhinitis 04/23/2009   Past Medical History:  Diagnosis Date   Anxiety    COVID 07/31/2020   muscle aches, fever, night sweats, congestion - no treatment   Depression    GERD (gastroesophageal reflux disease)    Pt states this no longer bothers her as of 09/17/21.   Hip pain, bilateral 2021   Pt states hip pain is no longer an issue as of 09/17/21.   Migraine    Pt states she only gets them randomly now as of 09/17/21.   Wears contact lenses    Wears glasses    Past Surgical History:  Procedure Laterality Date   LAPAROSCOPIC BILATERAL SALPINGECTOMY Bilateral 09/23/2021   Procedure: LAPAROSCOPIC BILATERAL SALPINGECTOMY;  Surgeon: Cleatus Moccasin, MD;  Location: [facility];  Service: Gynecology;  Laterality: Bilateral;   LAPAROSCOPIC GASTRIC SLEEVE RESECTION N/A 07/18/2018   Procedure: LAPAROSCOPIC GASTRIC SLEEVE RESECTION, UPPER ENDO, ERAS Pathway;  Surgeon: Signe Mitzie LABOR, MD;  Location: [facility];  Service: General;  Laterality: N/A;   TONSILLECTOMY AND ADENOIDECTOMY     as a child   Social History   Tobacco Use   Smoking status: Never   Smokeless tobacco: Never  Vaping Use   Vaping status: Never Used  Substance Use Topics   Alcohol use: No   Drug use: No  ROS: as noted in HPI  Objective:     BP 100/71   Pulse 78   Resp 17   Ht 5' 1 (1.549 m)   Wt 220 lb 12 oz (100.1 kg)   LMP 03/08/2024 (Exact Date)   SpO2 96%   BMI 41.71 kg/m  BP Readings from Last 3 Encounters:  03/29/24 100/71  03/15/24 118/86  09/07/23 110/74   Wt Readings from Last 3 Encounters:  03/29/24 220 lb 12 oz (100.1 kg)  03/15/24 217 lb (98.4 kg)  08/30/23 213 lb (96.6 kg)      Physical Exam Vitals and nursing note reviewed.  Constitutional:      General: She is not in acute distress.    Appearance: Normal  appearance. She is not ill-appearing, toxic-appearing or diaphoretic.  HENT:     Head: Normocephalic and atraumatic.  Eyes:     General: No scleral icterus.       Right eye: No discharge.        Left eye: No discharge.     Extraocular Movements: Extraocular movements intact.     Pupils: Pupils are equal, round, and reactive to light.  Cardiovascular:     Rate and Rhythm: Normal rate.  Pulmonary:     Effort: Pulmonary effort is normal. No respiratory distress.  Skin:    General: Skin is warm and dry.     Coloration: Skin is not jaundiced.     Findings: No bruising, erythema or rash.  Neurological:     General: No focal deficit present.     Mental Status: She is alert and oriented to person, place, and time.     Gait: Gait normal.  Psychiatric:        Mood and Affect: Mood normal.        Behavior: Behavior normal.      No results found for any visits on 03/29/24.    The ASCVD Risk score (Arnett DK, et al., 2019) failed to calculate for the following reasons:   The 2019 ASCVD risk score is only valid for ages 19 to 56  Assessment & Plan:  Other insomnia -     traZODone  HCl; Take 0.5-1 tablets (25-50 mg total) by mouth at bedtime as needed for sleep.  Dispense: 30 tablet; Refill: 3   Assessment and Plan    Insomnia Chronic difficulty falling asleep, worsened by daughter's anxiety-related awakenings. Concerned about sleep medication due to parenting responsibilities. - Prescribed trazodone  50 mg, advised starting with 25 mg if hesitant. - Instructed to take trazodone  30 minutes before bedtime, around 8:00-8:30 PM if bedtime is 9:00 PM. - Discussed potential side effects, including morning grogginess if taken too late and insufficient sleep.  Anxiety disorder Anxiety worsened by daughter's separation anxiety and sleep issues, affecting her mental health.        No follow-ups on file.   Benton LITTIE Gave, PA

## 2024-03-29 NOTE — Patient Instructions (Addendum)
 Start taking 0.5 to 1 tab nightly of trazodone  to help with sleep. Follow up in 6 weeks

## 2024-04-04 ENCOUNTER — Encounter: Payer: Self-pay | Admitting: Sports Medicine

## 2024-04-20 ENCOUNTER — Other Ambulatory Visit: Payer: Self-pay | Admitting: Urgent Care

## 2024-04-20 DIAGNOSIS — G4709 Other insomnia: Secondary | ICD-10-CM
